# Patient Record
Sex: Male | Born: 1955 | Race: Black or African American | Hispanic: No | Marital: Married | Smoking: Former smoker
Health system: Southern US, Community
[De-identification: ages and names within clinical notes are randomized; demographics above are authoritative.]

## PROBLEM LIST (undated history)

## (undated) DIAGNOSIS — Z972 Presence of dental prosthetic device (complete) (partial): Secondary | ICD-10-CM

## (undated) DIAGNOSIS — M1990 Unspecified osteoarthritis, unspecified site: Secondary | ICD-10-CM

## (undated) DIAGNOSIS — K219 Gastro-esophageal reflux disease without esophagitis: Secondary | ICD-10-CM

## (undated) DIAGNOSIS — E079 Disorder of thyroid, unspecified: Secondary | ICD-10-CM

## (undated) HISTORY — DX: Gastro-esophageal reflux disease without esophagitis: K21.9

---

## 2014-07-06 ENCOUNTER — Ambulatory Visit (INDEPENDENT_AMBULATORY_CARE_PROVIDER_SITE_OTHER): Payer: Self-pay | Admitting: Emergency Medicine

## 2014-07-06 VITALS — BP 120/84 | HR 93 | Temp 97.6°F | Resp 20 | Ht 65.0 in | Wt 164.1 lb

## 2014-07-06 DIAGNOSIS — Z029 Encounter for administrative examinations, unspecified: Secondary | ICD-10-CM

## 2014-07-06 DIAGNOSIS — Z021 Encounter for pre-employment examination: Secondary | ICD-10-CM

## 2014-07-06 NOTE — Progress Notes (Signed)
[facility] [address] [place] [address] [phone]  Date:  07/06/2014   Name:  Tony Hernandez   DOB:  12/23/1955   MRN:  [id number]  PCP:  No primary care provider on file.    Chief Complaint: Employment Physical   History of Present Illness:  Tony Hernandez is a 59 y.o. very pleasant male patient who presents with the following:  DOT   There are no active problems to display for this patient.   History reviewed. No pertinent past medical history.  History reviewed. No pertinent past surgical history.  History  Substance Use Topics  . Smoking status: Former Research scientist (life sciences)  . Smokeless tobacco: Never Used  . Alcohol Use: No    History reviewed. No pertinent family history.  No Known Allergies  Medication list has been reviewed and updated.  No current outpatient prescriptions on file prior to visit.   No current facility-administered medications on file prior to visit.    Review of Systems:  As per HPI, otherwise negative.    Physical Examination: Filed Vitals:   07/06/14 0914  BP: 120/84  Pulse: 93  Temp: 97.6 F (36.4 C)  Resp: 20   Filed Vitals:   07/06/14 0914  Height: 5\' 5"  (1.651 m)  Weight: 164 lb 2 oz (74.447 kg)   Body mass index is 27.31 kg/(m^2). Ideal Body Weight: Weight in (lb) to have BMI = 25: 149.9  GEN: WDWN, NAD, Non-toxic, A & O x 3 HEENT: Atraumatic, Normocephalic. Neck supple. No masses, No LAD. Ears and Nose: No external deformity. CV: RRR, No M/G/R. No JVD. No thrill. No extra heart sounds. PULM: CTA B, no wheezes, crackles, rhonchi. No retractions. No resp. distress. No accessory muscle use. ABD: S, NT, ND, +BS. No rebound. No HSM. EXTR: No c/c/e NEURO Normal gait.  PSYCH: Normally interactive. Conversant. Not depressed or anxious appearing.  Calm demeanor.    Assessment and Plan: DOT   Signed,  Ellison Carwin, MD

## 2014-08-27 ENCOUNTER — Emergency Department (HOSPITAL_COMMUNITY)
Admission: EM | Admit: 2014-08-27 | Discharge: 2014-08-27 | Disposition: A | Discharge status: Home / Self Care | Payer: Self-pay | Attending: Emergency Medicine | Admitting: Emergency Medicine

## 2014-08-27 ENCOUNTER — Encounter (HOSPITAL_COMMUNITY): Payer: Self-pay | Admitting: Family Medicine

## 2014-08-27 DIAGNOSIS — R202 Paresthesia of skin: Secondary | ICD-10-CM | POA: Insufficient documentation

## 2014-08-27 DIAGNOSIS — E039 Hypothyroidism, unspecified: Secondary | ICD-10-CM | POA: Insufficient documentation

## 2014-08-27 DIAGNOSIS — Z87891 Personal history of nicotine dependence: Secondary | ICD-10-CM | POA: Insufficient documentation

## 2014-08-27 HISTORY — DX: Disorder of thyroid, unspecified: E07.9

## 2014-08-27 LAB — I-STAT CHEM 8, ED
BUN: 14 mg/dL (ref 6–20)
Calcium, Ion: 1.25 mmol/L — ABNORMAL HIGH (ref 1.12–1.23)
Chloride: 102 mmol/L (ref 101–111)
Creatinine, Ser: 1.3 mg/dL — ABNORMAL HIGH (ref 0.61–1.24)
Glucose, Bld: 81 mg/dL (ref 65–99)
HCT: 41 % (ref 39.0–52.0)
Hemoglobin: 13.9 g/dL (ref 13.0–17.0)
Potassium: 3.6 mmol/L (ref 3.5–5.1)
Sodium: 140 mmol/L (ref 135–145)
TCO2: 25 mmol/L (ref 0–100)

## 2014-08-27 LAB — TSH: TSH: >90 u[IU]/mL — ABNORMAL HIGH (ref 0.350–4.500)

## 2014-08-27 LAB — T4, FREE: Free T4: <0.25 ng/dL — ABNORMAL LOW (ref 0.61–1.12)

## 2014-08-27 MED ORDER — LEVOTHYROXINE SODIUM 112 MCG PO TABS: 112.0000 ug | ORAL_TABLET | Freq: Every day | ORAL | Status: DC

## 2014-08-27 NOTE — ED Provider Notes (Signed)
CSN: [id number]     Arrival date & time 08/27/14  0850 History   First MD Initiated Contact with Patient 08/27/14 0912     Chief Complaint  Patient presents with  . Hand Pain  . Numbness   HPI Pt has noticed that both hands have felt numb and tingling in the finger tips of both hands for the past two weeks.  It comes and goes.  It seems to happen more at night.  He has to rub his hands to feel like the circulation is coming back. Pt has had trouble Past Medical History  Diagnosis Date  . Thyroid disease    History reviewed. No pertinent past surgical history. History reviewed. No pertinent family history. History  Substance Use Topics  . Smoking status: Former Research scientist (life sciences)  . Smokeless tobacco: Never Used  . Alcohol Use: No    Review of Systems    Allergies  Review of patient's allergies indicates no known allergies.  Home Medications   Prior to Admission medications   Medication Sig Start Date End Date Taking? Authorizing Provider  ibuprofen (ADVIL,MOTRIN) 200 MG tablet Take 200 mg by mouth every 6 (six) hours as needed for fever or moderate pain.   Yes Historical Provider, MD  levothyroxine (SYNTHROID, LEVOTHROID) 112 MCG tablet Take 1 tablet (112 mcg total) by mouth daily before breakfast. 08/27/14   Dorie Rank, MD   BP 127/86 mmHg  Pulse 59  Temp(Src) 97.8 F (36.6 C) (Oral)  Resp 20  SpO2 99% Physical Exam  Constitutional: He appears well-developed and well-nourished. No distress.  HENT:  Head: Normocephalic and atraumatic.  Right Ear: External ear normal.  Left Ear: External ear normal.  Eyes: Conjunctivae are normal. Right eye exhibits no discharge. Left eye exhibits no discharge. No scleral icterus.  Neck: Neck supple. No tracheal deviation present.  Cardiovascular: Normal rate, regular rhythm and intact distal pulses.   Pulmonary/Chest: Effort normal and breath sounds normal. No stridor. No respiratory distress. He has no wheezes. He has no rales.  Abdominal: Soft.  Bowel sounds are normal. He exhibits no distension. There is no tenderness. There is no rebound and no guarding.  Musculoskeletal: He exhibits no edema or tenderness.  Neurological: He is alert. He has normal strength. No cranial nerve deficit (no facial droop, extraocular movements intact, no slurred speech) or sensory deficit. He exhibits normal muscle tone. He displays no seizure activity. Coordination normal.  Skin: Skin is warm and dry. No rash noted.  Psychiatric: He has a normal mood and affect.  Nursing note and vitals reviewed.   ED Course  Procedures (including critical care time) Labs Review Labs Reviewed  T4, FREE - Abnormal; Notable for the following:    Free T4 <0.25 (*)    All other components within normal limits  I-STAT CHEM 8, ED - Abnormal; Notable for the following:    Creatinine, Ser 1.30 (*)    Calcium, Ion 1.25 (*)    All other components within normal limits  TSH     MDM   Final diagnoses:  Hypothyroidism, unspecified hypothyroidism type  Paresthesia    Pt has a normal exam.   No edema noted.  Will refill his thyroid med.  Referral to [facility].    Dorie Rank, MD 08/27/14 [phone]

## 2014-08-27 NOTE — Discharge Instructions (Signed)
Hypothyroidism The thyroid is a large gland located in the lower front of your neck. The thyroid gland helps control metabolism. Metabolism is how your body handles food. It controls metabolism with the hormone thyroxine. When this gland is underactive (hypothyroid), it produces too little hormone.  CAUSES These include:   Absence or destruction of thyroid tissue.  Goiter due to iodine deficiency.  Goiter due to medications.  Congenital defects (since birth).  Problems with the pituitary. This causes a lack of TSH (thyroid stimulating hormone). This hormone tells the thyroid to turn out more hormone. SYMPTOMS  Lethargy (feeling as though you have no energy)  Cold intolerance  Weight gain (in spite of normal food intake)  Dry skin  Coarse hair  Menstrual irregularity (if severe, may lead to infertility)  Slowing of thought processes Cardiac problems are also caused by insufficient amounts of thyroid hormone. Hypothyroidism in the newborn is cretinism, and is an extreme form. It is important that this form be treated adequately and immediately or it will lead rapidly to retarded physical and mental development. DIAGNOSIS  To prove hypothyroidism, your caregiver may do blood tests and ultrasound tests. Sometimes the signs are hidden. It may be necessary for your caregiver to watch this illness with blood tests either before or after diagnosis and treatment. TREATMENT  Low levels of thyroid hormone are increased by using synthetic thyroid hormone. This is a safe, effective treatment. It usually takes about four weeks to gain the full effects of the medication. After you have the full effect of the medication, it will generally take another four weeks for problems to leave. Your caregiver may start you on low doses. If you have had heart problems the dose may be gradually increased. It is generally not an emergency to get rapidly to normal. HOME CARE INSTRUCTIONS   Take your  medications as your caregiver suggests. Let your caregiver know of any medications you are taking or start taking. Your caregiver will help you with dosage schedules.  As your condition improves, your dosage needs may increase. It will be necessary to have continuing blood tests as suggested by your caregiver.  Report all suspected medication side effects to your caregiver. SEEK MEDICAL CARE IF: Seek medical care if you develop:  Sweating.  Tremulousness (tremors).  Anxiety.  Rapid weight loss.  Heat intolerance.  Emotional swings.  Diarrhea.  Weakness. SEEK IMMEDIATE MEDICAL CARE IF:  You develop chest pain, an irregular heart beat (palpitations), or a rapid heart beat. MAKE SURE YOU:   Understand these instructions.  Will watch your condition.  Will get help right away if you are not doing well or get worse. Document Released: 03/09/2005 Document Revised: 06/01/2011 Document Reviewed: 10/28/2007 ExitCare Patient Information 2015 [employer]. This information is not intended to replace advice given to you by your health care provider. Make sure you discuss any questions you have with your health care provider.  

## 2014-08-27 NOTE — ED Notes (Addendum)
Pt here with hand pain, numbness, right ear pain. sts he hasnt had his thyroid medication in 2 months. sts also some facial swelling and throat irritation.

## 2016-03-23 HISTORY — PX: CHOLECYSTECTOMY: SHX55

## 2016-06-09 ENCOUNTER — Emergency Department (HOSPITAL_COMMUNITY)
Admission: EM | Admit: 2016-06-09 | Discharge: 2016-06-09 | Disposition: A | Discharge status: Home / Self Care | Payer: BLUE CROSS/BLUE SHIELD | Attending: Emergency Medicine | Admitting: Emergency Medicine

## 2016-06-09 ENCOUNTER — Emergency Department (HOSPITAL_COMMUNITY): Payer: BLUE CROSS/BLUE SHIELD

## 2016-06-09 ENCOUNTER — Encounter (HOSPITAL_COMMUNITY): Payer: Self-pay | Admitting: Emergency Medicine

## 2016-06-09 DIAGNOSIS — K8020 Calculus of gallbladder without cholecystitis without obstruction: Secondary | ICD-10-CM | POA: Diagnosis not present

## 2016-06-09 DIAGNOSIS — Z87891 Personal history of nicotine dependence: Secondary | ICD-10-CM | POA: Diagnosis not present

## 2016-06-09 DIAGNOSIS — R1013 Epigastric pain: Secondary | ICD-10-CM | POA: Diagnosis present

## 2016-06-09 DIAGNOSIS — R1011 Right upper quadrant pain: Secondary | ICD-10-CM

## 2016-06-09 LAB — URINALYSIS, ROUTINE W REFLEX MICROSCOPIC
Bilirubin Urine: NEGATIVE
Glucose, UA: NEGATIVE mg/dL
Hgb urine dipstick: NEGATIVE
Ketones, ur: NEGATIVE mg/dL
Leukocytes, UA: NEGATIVE
Nitrite: NEGATIVE
Protein, ur: 30 mg/dL — AB
Specific Gravity, Urine: 1.016 (ref 1.005–1.030)
pH: 8 (ref 5.0–8.0)

## 2016-06-09 LAB — CBC
HCT: 40.1 % (ref 39.0–52.0)
Hemoglobin: 13.1 g/dL (ref 13.0–17.0)
MCH: 24.9 pg — ABNORMAL LOW (ref 26.0–34.0)
MCHC: 32.7 g/dL (ref 30.0–36.0)
MCV: 76.1 fL — ABNORMAL LOW (ref 78.0–100.0)
Platelets: 323 10*3/uL (ref 150–400)
RBC: 5.27 MIL/uL (ref 4.22–5.81)
RDW: 13.6 % (ref 11.5–15.5)
WBC: 9 10*3/uL (ref 4.0–10.5)

## 2016-06-09 LAB — COMPREHENSIVE METABOLIC PANEL
ALT: 34 U/L (ref 17–63)
AST: 53 U/L — ABNORMAL HIGH (ref 15–41)
Albumin: 3.7 g/dL (ref 3.5–5.0)
Alkaline Phosphatase: 56 U/L (ref 38–126)
Anion gap: 13 (ref 5–15)
BUN: 12 mg/dL (ref 6–20)
CO2: 22 mmol/L (ref 22–32)
Calcium: 9.4 mg/dL (ref 8.9–10.3)
Chloride: 103 mmol/L (ref 101–111)
Creatinine, Ser: 1.1 mg/dL (ref 0.61–1.24)
GFR calc Af Amer: >60 mL/min (ref >60)
GFR calc non Af Amer: >60 mL/min (ref >60)
Glucose, Bld: 162 mg/dL — ABNORMAL HIGH (ref 65–99)
Potassium: 3.7 mmol/L (ref 3.5–5.1)
Sodium: 138 mmol/L (ref 135–145)
Total Bilirubin: 0.4 mg/dL (ref 0.3–1.2)
Total Protein: 6.8 g/dL (ref 6.5–8.1)

## 2016-06-09 LAB — LIPASE, BLOOD: Lipase: 18 U/L (ref 11–51)

## 2016-06-09 MED ORDER — ONDANSETRON HCL 4 MG/2ML IJ SOLN
4.0000 mg | Freq: Once | INTRAMUSCULAR | Status: AC
  Administered 2016-06-09: 4 mg via INTRAVENOUS
  Filled 2016-06-09: qty 2

## 2016-06-09 MED ORDER — ONDANSETRON 4 MG PO TBDP: 4.0000 mg | ORAL_TABLET | Freq: Three times a day (TID) | ORAL | 0 refills | Status: DC | PRN

## 2016-06-09 MED ORDER — SODIUM CHLORIDE 0.9 % IV BOLUS (SEPSIS)
1000.0000 mL | Freq: Once | INTRAVENOUS | Status: AC
  Administered 2016-06-09: 1000 mL via INTRAVENOUS

## 2016-06-09 MED ORDER — OXYCODONE-ACETAMINOPHEN 5-325 MG PO TABS
1.0000 | ORAL_TABLET | Freq: Four times a day (QID) | ORAL | 0 refills | Status: DC | PRN
Start: 2016-06-09 — End: 2018-05-16

## 2016-06-09 MED ORDER — MORPHINE SULFATE (PF) 4 MG/ML IV SOLN
4.0000 mg | Freq: Once | INTRAVENOUS | Status: AC
  Administered 2016-06-09: 4 mg via INTRAVENOUS
  Filled 2016-06-09: qty 1

## 2016-06-09 NOTE — Discharge Instructions (Signed)
Return to the ED with any concerns including vomiting and not able to keep down liquids or your medications, abdominal pain that is not controlled by pain medications, fever or chills, and decreased urine output, decreased level of alertness or lethargy, or any other alarming symptoms.

## 2016-06-09 NOTE — ED Triage Notes (Signed)
Pt c/o 10/10 abd pain, started today at work. Pt states he vomited one time today and pain is getting worse. Wife states he got sea food for dinner last night no hx of allergy to this type of food.

## 2016-06-09 NOTE — ED Provider Notes (Signed)
[facility] DEPT Provider Note   CSN: [id number] Arrival date & time: 06/09/16  0938     History   Chief Complaint Chief Complaint  Patient presents with  . Abdominal Pain    HPI Tony Hernandez is a 61 y.o. male.  HPI  Pt presenting with acute onset this morning of mid and epigastric abdominal pain as well as one episode of vomiting- nonbilious and nonbloody.  No fever/chills.  No diarrhea.  Last BM was this morning begore the pain began.  No difficulty urinating or dysuria. Pt has not had similar pain in the past.  Pain is sharp and aching in nature.  Did not feel relieved by vomiting.  He has not had any treatment prior to arrival.  There are no other associated systemic symptoms, there are no other alleviating or modifying factors.   Past Medical History:  Diagnosis Date  . Thyroid disease     There are no active problems to display for this patient.   History reviewed. No pertinent surgical history.     Home Medications    Prior to Admission medications   Medication Sig Start Date End Date Taking? Authorizing Provider  ibuprofen (ADVIL,MOTRIN) 200 MG tablet Take 200 mg by mouth every 6 (six) hours as needed for fever or moderate pain.   Yes Historical Provider, MD  levothyroxine (SYNTHROID, LEVOTHROID) 112 MCG tablet Take 1 tablet (112 mcg total) by mouth daily before breakfast. 08/27/14  Yes Dorie Rank, MD  levothyroxine (SYNTHROID, LEVOTHROID) 137 MCG tablet Take 137 mcg by mouth daily. 04/01/16  Yes Historical Provider, MD  ondansetron (ZOFRAN ODT) 4 MG disintegrating tablet Take 1 tablet (4 mg total) by mouth every 8 (eight) hours as needed. 06/09/16   Alfonzo Beers, MD  oxyCODONE-acetaminophen (PERCOCET/ROXICET) 5-325 MG tablet Take 1-2 tablets by mouth every 6 (six) hours as needed for severe pain. 06/09/16   Alfonzo Beers, MD    Family History History reviewed. No pertinent family history.  Social History Social History  Substance Use Topics  . Smoking status:  Former Research scientist (life sciences)  . Smokeless tobacco: Never Used  . Alcohol use No     Allergies   Patient has no known allergies.   Review of Systems Review of Systems  ROS reviewed and all otherwise negative except for mentioned in HPI   Physical Exam Updated Vital Signs BP 118/88   Pulse 78   Temp 98.7 F (37.1 C) (Rectal)   Resp 13   Ht 5\' 5"  (1.651 m)   Wt 74.8 kg   SpO2 99%   BMI 27.46 kg/m  Vitals reviewed Physical Exam Physical Examination: General appearance - alert, well appearing, and in no distress Mental status - alert, oriented to person, place, and time Eyes - no conjunctival injection, no scleral icterus Mouth - mucous membranes moist, pharynx normal without lesions Neck - supple, no significant adenopathy Chest - clear to auscultation, no wheezes, rales or rhonchi, symmetric air entry Heart - normal rate, regular rhythm, normal S1, S2, no murmurs, rubs, clicks or gallops Abdomen - soft, epigastric and mid abdominal tenderness, no gaurding or rebound, nabs, nondistended, no masses or organomegaly Neurological - alert, oriented, normal speech, no focal findings or movement disorder noted Extremities - peripheral pulses normal, no pedal edema, no clubbing or cyanosis Skin - normal coloration and turgor, no rashes  ED Treatments / Results  Labs (all labs ordered are listed, but only abnormal results are displayed) Labs Reviewed  COMPREHENSIVE METABOLIC PANEL - Abnormal; Notable for  the following:       Result Value   Glucose, Bld 162 (*)    AST 53 (*)    All other components within normal limits  CBC - Abnormal; Notable for the following:    MCV 76.1 (*)    MCH 24.9 (*)    All other components within normal limits  URINALYSIS, ROUTINE W REFLEX MICROSCOPIC - Abnormal; Notable for the following:    Protein, ur 30 (*)    Bacteria, UA FEW (*)    Squamous Epithelial / LPF 0-5 (*)    All other components within normal limits  LIPASE, BLOOD    EKG  EKG  Interpretation None       Radiology Dg Abd Acute W/chest  Result Date: 06/09/2016 CLINICAL DATA:  Abdominal pain and vomiting for 1 day EXAM: DG ABDOMEN ACUTE W/ 1V CHEST COMPARISON:  None. FINDINGS: PA chest: Lungs are clear. Heart size and pulmonary vascularity are normal. No adenopathy. Supine and upright abdomen: There is moderate stool throughout the colon. There is no bowel dilatation or air-fluid level suggesting bowel obstruction. No free air. There is lumbar levoscoliosis. IMPRESSION: Moderate stool throughout colon. No bowel obstruction or free air. Lungs clear. Electronically Signed   By: Lowella Grip III M.D.   On: 06/09/2016 08:20   US Abdomen Limited Ruq  Result Date: 06/09/2016 CLINICAL DATA:  Right upper quadrant pain with nausea EXAM: US ABDOMEN LIMITED - RIGHT UPPER QUADRANT COMPARISON:  None. FINDINGS: Gallbladder: Within the gallbladder, there is an echogenic focus in the gallbladder which moves and shadows measuring 1.3 cm in length consistent with cholelithiasis. There is no gallbladder wall thickening or pericholecystic fluid. No sonographic Murphy sign noted by sonographer. Common bile duct: Diameter: 4 mm. No intrahepatic or extrahepatic biliary duct dilatation. Liver: No focal lesion identified. Within normal limits in parenchymal echogenicity. IMPRESSION: Cholelithiasis.  Study otherwise unremarkable. Electronically Signed   By: Lowella Grip III M.D.   On: 06/09/2016 09:56    Procedures Procedures (including critical care time)  Medications Ordered in ED Medications  morphine 4 MG/ML injection 4 mg (4 mg Intravenous Given 06/09/16 0815)  ondansetron (ZOFRAN) injection 4 mg (4 mg Intravenous Given 06/09/16 0815)  sodium chloride 0.9 % bolus 1,000 mL (0 mLs Intravenous Stopped 06/09/16 1148)     Initial Impression / Assessment and Plan / ED Course  I have reviewed the triage vital signs and the nursing notes.  Pertinent labs & imaging results that were  available during my care of the patient were reviewed by me and considered in my medical decision making (see chart for details).     Pt presenting with onset of epigastric pain and one episode of emesis, workup shows he has gallstones- pt feels much improved after pain and nausea meds- he is able to tolerate po fluids in the Ed. Discussed results and plan with patient and his wife.  Given referral to surgery.  Discharged with strict return precautions.  Pt agreeable with plan.  Final Clinical Impressions(s) / ED Diagnoses   Final diagnoses:  Gallstones    New Prescriptions Discharge Medication List as of 06/09/2016 11:39 AM    START taking these medications   Details  ondansetron (ZOFRAN ODT) 4 MG disintegrating tablet Take 1 tablet (4 mg total) by mouth every 8 (eight) hours as needed., Starting Tue 06/09/2016, Print    oxyCODONE-acetaminophen (PERCOCET/ROXICET) 5-325 MG tablet Take 1-2 tablets by mouth every 6 (six) hours as needed for severe pain., Starting Tue  06/09/2016, Print         Alfonzo Beers, MD 06/09/16 1600

## 2018-03-07 ENCOUNTER — Ambulatory Visit: Payer: BLUE CROSS/BLUE SHIELD | Admitting: Family Medicine

## 2018-03-07 DIAGNOSIS — Z0289 Encounter for other administrative examinations: Secondary | ICD-10-CM

## 2018-04-05 ENCOUNTER — Ambulatory Visit: Payer: BLUE CROSS/BLUE SHIELD | Admitting: Family Medicine

## 2018-04-05 DIAGNOSIS — Z0289 Encounter for other administrative examinations: Secondary | ICD-10-CM

## 2018-05-16 ENCOUNTER — Ambulatory Visit: Payer: BLUE CROSS/BLUE SHIELD | Admitting: Primary Care

## 2018-05-16 ENCOUNTER — Encounter: Payer: Self-pay | Admitting: Primary Care

## 2018-05-16 DIAGNOSIS — K219 Gastro-esophageal reflux disease without esophagitis: Secondary | ICD-10-CM | POA: Insufficient documentation

## 2018-05-16 DIAGNOSIS — E039 Hypothyroidism, unspecified: Secondary | ICD-10-CM | POA: Diagnosis not present

## 2018-05-16 LAB — COMPREHENSIVE METABOLIC PANEL
ALT: 22 U/L (ref 0–53)
AST: 17 U/L (ref 0–37)
Albumin: 4.4 g/dL (ref 3.5–5.2)
Alkaline Phosphatase: 60 U/L (ref 39–117)
BUN: 12 mg/dL (ref 6–23)
CO2: 27 mEq/L (ref 19–32)
Calcium: 9.7 mg/dL (ref 8.4–10.5)
Chloride: 106 mEq/L (ref 96–112)
Creatinine, Ser: 0.99 mg/dL (ref 0.40–1.50)
GFR: 93.08 mL/min (ref >60.00)
Glucose, Bld: 80 mg/dL (ref 70–99)
Potassium: 3.9 mEq/L (ref 3.5–5.1)
Sodium: 140 mEq/L (ref 135–145)
Total Bilirubin: 0.3 mg/dL (ref 0.2–1.2)
Total Protein: 7.3 g/dL (ref 6.0–8.3)

## 2018-05-16 LAB — TSH: TSH: 1.66 u[IU]/mL (ref 0.35–4.50)

## 2018-05-16 MED ORDER — LEVOTHYROXINE SODIUM 150 MCG PO TABS: ORAL_TABLET | ORAL | 0 refills | Status: DC

## 2018-05-16 NOTE — Assessment & Plan Note (Signed)
Intermittent, seems to be doing well off of pantoprazole, continue off. He will update if symptoms return. Consider Pepcid vs lower dose PPI if symptoms return.

## 2018-05-16 NOTE — Progress Notes (Signed)
Subjective:    Patient ID: Tony Hernandez, male    DOB: September 04, 1955, 63 y.o.   MRN: [id number]  HPI  Tony Hernandez is a 63 year old male who presents today to establish care and discuss the problems mentioned below. Will obtain old records.  1) Hypothyroidism: Currently managed on levothyroxine 150 mcg. He takes his medication in the morning with water and doesn't eat for 1-2 hours after taking. He doesn't take any vitamins or other supplements. He took his last tablet this morning and is needing refills.   2) GERD: Currently managed on pantoprazole 40 mg daily. Prescribed initially at [facility] about one year ago for epigastric abdominal pain. He's been taking this intermittently since it was prescribed, has no symptoms without his medication. He denies esophageal burning, chest pain, epigastric pain, nausea.  BP Readings from Last 3 Encounters:  05/16/18 130/84  06/09/16 118/88  08/27/14 127/86     Review of Systems  Eyes: Negative for visual disturbance.  Respiratory: Negative for shortness of breath.   Cardiovascular: Negative for chest pain.  Neurological: Negative for dizziness and headaches.       Past Medical History:  Diagnosis Date  . GERD (gastroesophageal reflux disease)   . Thyroid disease      Social History   Socioeconomic History  . Marital status: Single    Spouse name: Not on file  . Number of children: Not on file  . Years of education: Not on file  . Highest education level: Not on file  Occupational History  . Not on file  Social Needs  . Financial resource strain: Not on file  . Food insecurity:    Worry: Not on file    Inability: Not on file  . Transportation needs:    Medical: Not on file    Non-medical: Not on file  Tobacco Use  . Smoking status: Former Research scientist (life sciences)  . Smokeless tobacco: Never Used  Substance and Sexual Activity  . Alcohol use: No    Alcohol/week: 0.0 standard drinks  . Drug use: No  . Sexual activity: Not on file    Lifestyle  . Physical activity:    Days per week: Not on file    Minutes per session: Not on file  . Stress: Not on file  Relationships  . Social connections:    Talks on phone: Not on file    Gets together: Not on file    Attends religious service: Not on file    Active member of club or organization: Not on file    Attends meetings of clubs or organizations: Not on file    Relationship status: Not on file  . Intimate partner violence:    Fear of current or ex partner: Not on file    Emotionally abused: Not on file    Physically abused: Not on file    Forced sexual activity: Not on file  Other Topics Concern  . Not on file  Social History Narrative   Single.   2 children.   Works as a Administrator.   Enjoys spending time with family.    Past Surgical History:  Procedure Laterality Date  . CHOLECYSTECTOMY  2018    Family History  Problem Relation Age of Onset  . Lung cancer Mother   . Diabetes Paternal Grandmother     No Known Allergies  Current Outpatient Medications on File Prior to Visit  Medication Sig Dispense Refill  . levothyroxine (SYNTHROID, LEVOTHROID) 150 MCG tablet  Take 150 mcg by mouth daily before breakfast.      No current facility-administered medications on file prior to visit.     BP 130/84   Pulse 91   Temp 98.1 F (36.7 C) (Oral)   Ht 5' 4.5" (1.638 m)   Wt 164 lb (74.4 kg)   SpO2 98%   BMI 27.72 kg/m    Objective:   Physical Exam  Constitutional: He appears well-nourished.  Neck: Neck supple. No thyromegaly present.  Cardiovascular: Normal rate and regular rhythm.  Respiratory: Effort normal and breath sounds normal.  Skin: Skin is warm and dry.           Assessment & Plan:

## 2018-05-16 NOTE — Patient Instructions (Addendum)
Stop by the lab prior to leaving today. I will notify you of your results once received.   I sent in a 30 day supply of your levothyroxine to the pharmacy. I will send more once we confirm that your thyroid lab is stable.  It was a pleasure to meet you today! Please don't hesitate to call or message me with any questions. Welcome to [facility]!

## 2018-05-16 NOTE — Assessment & Plan Note (Signed)
Compliant to levothyroxine and is taking appropriately.  Will check TSH today. We will also provide him with a 30 day supply of his current dose as he took his last dose today. Once we have confirmed that he is stable we will refill for 90 days per Rx. He verbalized understanding.

## 2018-05-18 ENCOUNTER — Telehealth: Payer: Self-pay | Admitting: Primary Care

## 2018-05-18 NOTE — Telephone Encounter (Signed)
Rec'd from [employer] forwarded 33 pages to Emery NP

## 2018-05-23 ENCOUNTER — Telehealth: Payer: Self-pay | Admitting: Primary Care

## 2018-05-23 NOTE — Telephone Encounter (Signed)
Please notify patient that I received his records and see that his PSA (prostate level) was elevated. Did anyone ever mention this to him? Did he ever see a Urologist?

## 2018-05-25 NOTE — Telephone Encounter (Signed)
Tried to call patient on 05/24/2018 and 05/25/2018. However, could not leave message since voicemail box is not set up.

## 2018-05-26 NOTE — Telephone Encounter (Signed)
Please send patient letter stating that his PSA (prostate level) was elevated from prior records. I would like for him to return for a lab appointment to recheck his PSA.

## 2018-05-27 ENCOUNTER — Encounter: Payer: Self-pay | Admitting: *Deleted

## 2018-05-27 NOTE — Telephone Encounter (Signed)
Sending letter

## 2018-06-07 ENCOUNTER — Other Ambulatory Visit: Payer: Self-pay | Admitting: Primary Care

## 2018-06-07 DIAGNOSIS — Z87898 Personal history of other specified conditions: Secondary | ICD-10-CM

## 2018-06-09 ENCOUNTER — Other Ambulatory Visit: Payer: Self-pay

## 2018-06-09 ENCOUNTER — Other Ambulatory Visit (INDEPENDENT_AMBULATORY_CARE_PROVIDER_SITE_OTHER): Payer: BLUE CROSS/BLUE SHIELD

## 2018-06-09 DIAGNOSIS — Z87898 Personal history of other specified conditions: Secondary | ICD-10-CM | POA: Diagnosis not present

## 2018-06-09 LAB — PSA: PSA: 6.55 ng/mL — ABNORMAL HIGH (ref 0.10–4.00)

## 2018-06-10 ENCOUNTER — Other Ambulatory Visit: Payer: Self-pay | Admitting: Primary Care

## 2018-06-10 ENCOUNTER — Telehealth: Payer: Self-pay

## 2018-06-10 ENCOUNTER — Encounter: Payer: Self-pay | Admitting: Primary Care

## 2018-06-10 NOTE — Telephone Encounter (Signed)
Pt called with lab results he is agreeable to see a urologist. PSA is elevated as it was on his medical records

## 2018-09-08 ENCOUNTER — Other Ambulatory Visit: Payer: Self-pay | Admitting: Primary Care

## 2018-09-08 DIAGNOSIS — E039 Hypothyroidism, unspecified: Secondary | ICD-10-CM

## 2018-10-06 ENCOUNTER — Other Ambulatory Visit: Payer: Self-pay | Admitting: Primary Care

## 2018-10-06 DIAGNOSIS — E039 Hypothyroidism, unspecified: Secondary | ICD-10-CM

## 2018-10-29 ENCOUNTER — Other Ambulatory Visit: Payer: Self-pay | Admitting: Primary Care

## 2018-10-29 DIAGNOSIS — E039 Hypothyroidism, unspecified: Secondary | ICD-10-CM

## 2019-01-03 ENCOUNTER — Other Ambulatory Visit: Payer: Self-pay

## 2019-01-03 DIAGNOSIS — Z20822 Contact with and (suspected) exposure to covid-19: Secondary | ICD-10-CM

## 2019-01-05 LAB — NOVEL CORONAVIRUS, NAA: SARS-CoV-2, NAA: NOT DETECTED

## 2019-04-07 ENCOUNTER — Ambulatory Visit (INDEPENDENT_AMBULATORY_CARE_PROVIDER_SITE_OTHER): Payer: 59 | Admitting: Primary Care

## 2019-04-07 ENCOUNTER — Other Ambulatory Visit: Payer: Self-pay

## 2019-04-07 ENCOUNTER — Encounter: Payer: Self-pay | Admitting: Primary Care

## 2019-04-07 VITALS — BP 124/82 | HR 86 | Temp 97.3°F | Resp 18 | Ht 66.0 in | Wt 176.8 lb

## 2019-04-07 DIAGNOSIS — Z23 Encounter for immunization: Secondary | ICD-10-CM

## 2019-04-07 DIAGNOSIS — E039 Hypothyroidism, unspecified: Secondary | ICD-10-CM | POA: Diagnosis not present

## 2019-04-07 DIAGNOSIS — K219 Gastro-esophageal reflux disease without esophagitis: Secondary | ICD-10-CM

## 2019-04-07 DIAGNOSIS — Z1211 Encounter for screening for malignant neoplasm of colon: Secondary | ICD-10-CM | POA: Diagnosis not present

## 2019-04-07 DIAGNOSIS — Z87898 Personal history of other specified conditions: Secondary | ICD-10-CM | POA: Diagnosis not present

## 2019-04-07 DIAGNOSIS — Z0000 Encounter for general adult medical examination without abnormal findings: Secondary | ICD-10-CM | POA: Diagnosis not present

## 2019-04-07 NOTE — Assessment & Plan Note (Signed)
Tetanus and first Shingrix vaccines due, provided today. Declines influenza vaccine. PSA overdue for repeat, pending. Colonoscopy overdue, referral placed to GI. Encouraged a healthy diet, regular exercise.  Exam today benign. Labs pending.

## 2019-04-07 NOTE — Assessment & Plan Note (Addendum)
Prostate exam today without obvious nodules or enlargement. No systemic alarm signs.  Repeat PSA pending today.  After further chart review he's had numerous elevated PSA readings ranging 6-12. For this reason we will refer him to Urology for further evaluation. He agrees.

## 2019-04-07 NOTE — Patient Instructions (Signed)
Stop by the lab prior to leaving today. I will notify you of your results once received.   Be sure to take your levothyroxine (thyroid medication) every morning on an empty stomach with water only. No food or other medications for 30 minutes. No heartburn medication, iron pills, calcium, vitamin D, or magnesium pills within four hours of taking levothyroxine.   Start exercising. You should be getting 150 minutes of moderate intensity exercise weekly.  It's important to improve your diet by reducing consumption of fast food, fried food, processed snack foods, sugary drinks. Increase consumption of fresh vegetables and fruits, whole grains, water.  Ensure you are drinking 64 ounces of water daily.  Schedule a nurse visit for your second Shingles vaccine in 2-6 months.  It was a pleasure to see you today!   Preventive Care 58-68 Years Old, Male Preventive care refers to lifestyle choices and visits with your health care provider that can promote health and wellness. This includes:  A yearly physical exam. This is also called an annual well check.  Regular dental and eye exams.  Immunizations.  Screening for certain conditions.  Healthy lifestyle choices, such as eating a healthy diet, getting regular exercise, not using drugs or products that contain nicotine and tobacco, and limiting alcohol use. What can I expect for my preventive care visit? Physical exam Your health care provider will check:  Height and weight. These may be used to calculate body mass index (BMI), which is a measurement that tells if you are at a healthy weight.  Heart rate and blood pressure.  Your skin for abnormal spots. Counseling Your health care provider may ask you questions about:  Alcohol, tobacco, and drug use.  Emotional well-being.  Home and relationship well-being.  Sexual activity.  Eating habits.  Work and work Statistician. What immunizations do I need?  Influenza (flu)  vaccine  This is recommended every year. Tetanus, diphtheria, and pertussis (Tdap) vaccine  You may need a Td booster every 10 years. Varicella (chickenpox) vaccine  You may need this vaccine if you have not already been vaccinated. Zoster (shingles) vaccine  You may need this after age 42. Measles, mumps, and rubella (MMR) vaccine  You may need at least one dose of MMR if you were born in 1957 or later. You may also need a second dose. Pneumococcal conjugate (PCV13) vaccine  You may need this if you have certain conditions and were not previously vaccinated. Pneumococcal polysaccharide (PPSV23) vaccine  You may need one or two doses if you smoke cigarettes or if you have certain conditions. Meningococcal conjugate (MenACWY) vaccine  You may need this if you have certain conditions. Hepatitis A vaccine  You may need this if you have certain conditions or if you travel or work in places where you may be exposed to hepatitis A. Hepatitis B vaccine  You may need this if you have certain conditions or if you travel or work in places where you may be exposed to hepatitis B. Haemophilus influenzae type b (Hib) vaccine  You may need this if you have certain risk factors. Human papillomavirus (HPV) vaccine  If recommended by your health care provider, you may need three doses over 6 months. You may receive vaccines as individual doses or as more than one vaccine together in one shot (combination vaccines). Talk with your health care provider about the risks and benefits of combination vaccines. What tests do I need? Blood tests  Lipid and cholesterol levels. These may be checked  every 5 years, or more frequently if you are over 40 years old.  Hepatitis C test.  Hepatitis B test. Screening  Lung cancer screening. You may have this screening every year starting at age 69 if you have a 30-pack-year history of smoking and currently smoke or have quit within the past 15  years.  Prostate cancer screening. Recommendations will vary depending on your family history and other risks.  Colorectal cancer screening. All adults should have this screening starting at age 73 and continuing until age 54. Your health care provider may recommend screening at age 57 if you are at increased risk. You will have tests every 1-10 years, depending on your results and the type of screening test.  Diabetes screening. This is done by checking your blood sugar (glucose) after you have not eaten for a while (fasting). You may have this done every 1-3 years.  Sexually transmitted disease (STD) testing. Follow these instructions at home: Eating and drinking  Eat a diet that includes fresh fruits and vegetables, whole grains, lean protein, and low-fat dairy products.  Take vitamin and mineral supplements as recommended by your health care provider.  Do not drink alcohol if your health care provider tells you not to drink.  If you drink alcohol: ? Limit how much you have to 0-2 drinks a day. ? Be aware of how much alcohol is in your drink. In the [place] one drink equals one 12 oz bottle of beer (355 mL), one 5 oz glass of wine (148 mL), or one 1 oz glass of hard liquor (44 mL). Lifestyle  Take daily care of your teeth and gums.  Stay active. Exercise for at least 30 minutes on 5 or more days each week.  Do not use any products that contain nicotine or tobacco, such as cigarettes, e-cigarettes, and chewing tobacco. If you need help quitting, ask your health care provider.  If you are sexually active, practice safe sex. Use a condom or other form of protection to prevent STIs (sexually transmitted infections).  Talk with your health care provider about taking a low-dose aspirin every day starting at age 89. What's next?  Go to your health care provider once a year for a well check visit.  Ask your health care provider how often you should have your eyes and teeth  checked.  Stay up to date on all vaccines. This information is not intended to replace advice given to you by your health care provider. Make sure you discuss any questions you have with your health care provider. Document Revised: 03/03/2018 Document Reviewed: 03/03/2018 Elsevier Patient Education  2020 [employer].

## 2019-04-07 NOTE — Assessment & Plan Note (Signed)
He is taking levothyroxine correctly, repeat TSH pending.

## 2019-04-07 NOTE — Progress Notes (Signed)
Subjective:    Patient ID: Tony Hernandez, male    DOB: 06/04/1955, 64 y.o.   MRN: [id number]  HPI  This visit occurred during the SARS-CoV-2 public health emergency.  Safety protocols were in place, including screening questions prior to the visit, additional usage of staff PPE, and extensive cleaning of exam room while observing appropriate contact time as indicated for disinfecting solutions.   Tony Hernandez is a 64 year old male who presents today for complete physical.  Immunizations: -Tetanus: Unsure, over 10 years.  -Influenza: Declines  -Shingles: Never completed, due today.  Diet: He endorses a healthy diet. Vegetables, little red meat. Exercise: He walks daily at work.  Eye exam: No recent exam  Dental exam: No recent exam  Colonoscopy: Never completed  PSA: 6.25 in 2020, patient didn't return phone calls.  Hep C Screen: Due  BP Readings from Last 3 Encounters:  04/07/19 124/82  05/16/18 130/84  06/09/16 118/88   Wt Readings from Last 3 Encounters:  04/07/19 176 lb 12.8 oz (80.2 kg)  05/16/18 164 lb (74.4 kg)  06/09/16 165 lb (74.8 kg)     Review of Systems  Constitutional: Negative for unexpected weight change.  HENT: Negative for rhinorrhea.   Respiratory: Negative for cough and shortness of breath.   Cardiovascular: Negative for chest pain.  Gastrointestinal: Negative for constipation and diarrhea.  Genitourinary: Negative for difficulty urinating, hematuria and urgency.       Nocturia 1-2 times nightly.  Musculoskeletal: Positive for arthralgias. Negative for myalgias.       Chronic knee pain  Skin: Negative for rash.  Allergic/Immunologic: Negative for environmental allergies.  Neurological: Negative for dizziness, numbness and headaches.  Psychiatric/Behavioral: The patient is not nervous/anxious.        Past Medical History:  Diagnosis Date  . GERD (gastroesophageal reflux disease)   . Thyroid disease      Social History   Socioeconomic History   . Marital status: Single    Spouse name: Not on file  . Number of children: Not on file  . Years of education: Not on file  . Highest education level: Not on file  Occupational History  . Not on file  Tobacco Use  . Smoking status: Former Research scientist (life sciences)  . Smokeless tobacco: Never Used  Substance and Sexual Activity  . Alcohol use: No    Alcohol/week: 0.0 standard drinks  . Drug use: No  . Sexual activity: Not on file  Other Topics Concern  . Not on file  Social History Narrative   Single.   2 children.   Works as a Administrator.   Enjoys spending time with family.   Social Determinants of Health   Financial Resource Strain:   . Difficulty of Paying Living Expenses: Not on file  Food Insecurity:   . Worried About Charity fundraiser in the Last Year: Not on file  . Ran Out of Food in the Last Year: Not on file  Transportation Needs:   . Lack of Transportation (Medical): Not on file  . Lack of Transportation (Non-Medical): Not on file  Physical Activity:   . Days of Exercise per Week: Not on file  . Minutes of Exercise per Session: Not on file  Stress:   . Feeling of Stress : Not on file  Social Connections:   . Frequency of Communication with Friends and Family: Not on file  . Frequency of Social Gatherings with Friends and Family: Not on file  . Attends  Religious Services: Not on file  . Active Member of Clubs or Organizations: Not on file  . Attends Archivist Meetings: Not on file  . Marital Status: Not on file  Intimate Partner Violence:   . Fear of Current or Ex-Partner: Not on file  . Emotionally Abused: Not on file  . Physically Abused: Not on file  . Sexually Abused: Not on file    Past Surgical History:  Procedure Laterality Date  . CHOLECYSTECTOMY  2018    Family History  Problem Relation Age of Onset  . Lung cancer Mother   . Diabetes Paternal Grandmother     No Known Allergies  Current Outpatient Medications on File Prior to Visit    Medication Sig Dispense Refill  . levothyroxine (SYNTHROID) 150 MCG tablet TAKE 1 TABLET BY MOUTH EVERY DAY 90 tablet 1   No current facility-administered medications on file prior to visit.    BP 124/82   Pulse 86   Temp (!) 97.3 F (36.3 C) (Temporal)   Resp 18   Ht 5\' 6"  (1.676 m)   Wt 176 lb 12.8 oz (80.2 kg)   SpO2 97%   BMI 28.54 kg/m    Objective:   Physical Exam  Constitutional: He is oriented to person, place, and time. He appears well-nourished.  HENT:  Right Ear: Tympanic membrane and ear canal normal.  Left Ear: Tympanic membrane and ear canal normal.  Mouth/Throat: Oropharynx is clear and moist.  Eyes: Pupils are equal, round, and reactive to light. EOM are normal.  Cardiovascular: Normal rate and regular rhythm.  Respiratory: Effort normal and breath sounds normal.  GI: Soft. Bowel sounds are normal. There is no abdominal tenderness.  Genitourinary: Prostate is not enlarged and not tender.    Genitourinary Comments: No obvious enlargement or nodules noted on prostate exam. Chaperone present.    Musculoskeletal:        General: Normal range of motion.     Cervical back: Neck supple.  Neurological: He is alert and oriented to person, place, and time. No cranial nerve deficit.  Reflex Scores:      Patellar reflexes are 2+ on the right side and 2+ on the left side. Skin: Skin is warm and dry.  Psychiatric: He has a normal mood and affect.           Assessment & Plan:

## 2019-04-07 NOTE — Assessment & Plan Note (Signed)
No recent symptoms. Continue to monitor off of PPI.

## 2019-04-08 LAB — COMPREHENSIVE METABOLIC PANEL
AG Ratio: 1.8 (calc) (ref 1.0–2.5)
ALT: 16 U/L (ref 9–46)
AST: 13 U/L (ref 10–35)
Albumin: 4.4 g/dL (ref 3.6–5.1)
Alkaline phosphatase (APISO): 60 U/L (ref 35–144)
BUN: 12 mg/dL (ref 7–25)
CO2: 26 mmol/L (ref 20–32)
Calcium: 9.9 mg/dL (ref 8.6–10.3)
Chloride: 106 mmol/L (ref 98–110)
Creat: 1.1 mg/dL (ref 0.70–1.25)
Globulin: 2.5 g/dL (calc) (ref 1.9–3.7)
Glucose, Bld: 75 mg/dL (ref 65–99)
Potassium: 4.8 mmol/L (ref 3.5–5.3)
Sodium: 140 mmol/L (ref 135–146)
Total Bilirubin: 0.3 mg/dL (ref 0.2–1.2)
Total Protein: 6.9 g/dL (ref 6.1–8.1)

## 2019-04-08 LAB — CBC
HCT: 42.8 % (ref 38.5–50.0)
Hemoglobin: 13.9 g/dL (ref 13.2–17.1)
MCH: 24.8 pg — ABNORMAL LOW (ref 27.0–33.0)
MCHC: 32.5 g/dL (ref 32.0–36.0)
MCV: 76.3 fL — ABNORMAL LOW (ref 80.0–100.0)
MPV: 10.8 fL (ref 7.5–12.5)
Platelets: 389 10*3/uL (ref 140–400)
RBC: 5.61 10*6/uL (ref 4.20–5.80)
RDW: 14.2 % (ref 11.0–15.0)
WBC: 9.1 10*3/uL (ref 3.8–10.8)

## 2019-04-08 LAB — LIPID PANEL
Cholesterol: 142 mg/dL (ref <200)
HDL: 34 mg/dL — ABNORMAL LOW (ref >=40)
LDL Cholesterol (Calc): 85 mg/dL (calc)
Non-HDL Cholesterol (Calc): 108 mg/dL (calc) (ref <130)
Total CHOL/HDL Ratio: 4.2 (calc) (ref <5.0)
Triglycerides: 133 mg/dL (ref <150)

## 2019-04-08 LAB — TSH: TSH: 2.96 mIU/L (ref 0.40–4.50)

## 2019-04-08 LAB — HEMOGLOBIN A1C
Hgb A1c MFr Bld: 6.1 % of total Hgb — ABNORMAL HIGH (ref <5.7)
Mean Plasma Glucose: 128 (calc)
eAG (mmol/L): 7.1 (calc)

## 2019-04-08 LAB — PSA: PSA: 7.5 ng/mL — ABNORMAL HIGH (ref <=4.0)

## 2019-04-10 ENCOUNTER — Other Ambulatory Visit: Payer: Self-pay

## 2019-04-10 ENCOUNTER — Telehealth: Payer: Self-pay

## 2019-04-10 DIAGNOSIS — Z1211 Encounter for screening for malignant neoplasm of colon: Secondary | ICD-10-CM

## 2019-04-10 DIAGNOSIS — Z800 Family history of malignant neoplasm of digestive organs: Secondary | ICD-10-CM

## 2019-04-10 NOTE — Telephone Encounter (Signed)
Gastroenterology Pre-Procedure Review  Request Date: Friday 05/19/19 Requesting Physician: Dr. Vicente Males  PATIENT REVIEW QUESTIONS: The patient responded to the following health history questions as indicated:    1. Are you having any GI issues? no 2. Do you have a personal history of Polyps? no 3. Do you have a family history of Colon Cancer or Polyps? yes (sister colon cancer) 4. Diabetes Mellitus? no 5. Joint replacements in the past 12 months?no 6. Major health problems in the past 3 months?no 7. Any artificial heart valves, MVP, or defibrillator?no    MEDICATIONS & ALLERGIES:    Patient reports the following regarding taking any anticoagulation/antiplatelet therapy:   Plavix, Coumadin, Eliquis, Xarelto, Lovenox, Pradaxa, Brilinta, or Effient? no Aspirin? no  Patient confirms/reports the following medications:  Current Outpatient Medications  Medication Sig Dispense Refill  . levothyroxine (SYNTHROID) 150 MCG tablet TAKE 1 TABLET BY MOUTH EVERY DAY 90 tablet 1   No current facility-administered medications for this visit.    Patient confirms/reports the following allergies:  No Known Allergies  No orders of the defined types were placed in this encounter.   AUTHORIZATION INFORMATION Primary Insurance: 1D#: Group #:  Secondary Insurance: 1D#: Group #:  SCHEDULE INFORMATION: Date:  Time: Location:[facility]

## 2019-05-11 ENCOUNTER — Encounter: Payer: Self-pay | Admitting: Gastroenterology

## 2019-05-12 ENCOUNTER — Other Ambulatory Visit: Payer: Self-pay

## 2019-05-12 ENCOUNTER — Ambulatory Visit: Payer: 59 | Admitting: Urology

## 2019-05-12 ENCOUNTER — Encounter: Payer: Self-pay | Admitting: Urology

## 2019-05-12 VITALS — BP 156/100 | HR 108 | Ht 65.0 in | Wt 165.0 lb

## 2019-05-12 DIAGNOSIS — Z87898 Personal history of other specified conditions: Secondary | ICD-10-CM

## 2019-05-12 NOTE — Progress Notes (Signed)
05/12/2019 12:19 PM   Stephannie Li Sparr December 11, 1955 [id number]  Referring provider: Pleas Koch, NP [address],  [place] [address]  Chief Complaint  Patient presents with  . Elevated PSA    HPI: 64 year old male who presents today for further evaluation of elevated PSA.  He has a personal history of elevated/fluctuating PSA as high as 12 in 2019.  Most recent PSA 7.5 on 04/07/2019 up from the previous year at which time it was 6.55 in 05/2018.  He denies any urinary symptoms.  He reports that he voids well, is able to empty his bladder with a good stream.  No urgency or frequency.  He does get up twice at night to void but he drinks a copious amounts of fluid before going to bed.  He is not bothered by this.  No history of dysuria or pelvic pain.  No history of UTIs.  No weight loss or bone pain.  No family history of prostate cancer.   PMH: Past Medical History:  Diagnosis Date  . Arthritis    knees  . GERD (gastroesophageal reflux disease)   . Thyroid disease   . Wears dentures     Surgical History: Past Surgical History:  Procedure Laterality Date  . CHOLECYSTECTOMY  2018    Home Medications:  Allergies as of 05/12/2019   No Known Allergies     Medication List       Accurate as of May 12, 2019 12:19 PM. If you have any questions, ask your nurse or doctor.        levothyroxine 150 MCG tablet Commonly known as: SYNTHROID TAKE 1 TABLET BY MOUTH EVERY DAY       Allergies: No Known Allergies  Family History: Family History  Problem Relation Age of Onset  . Lung cancer Mother   . Diabetes Paternal Grandmother     Social History:  reports that he has quit smoking. He has never used smokeless tobacco. He reports that he does not drink alcohol or use drugs.   Physical Exam: BP (!) 156/100 (BP Location: Left Arm, Patient Position: Sitting, Cuff Size: Normal)   Pulse (!) 108   Ht 5\' 5"  (1.651 m)   Wt 165 lb (74.8 kg)   BMI 27.46  kg/m   Constitutional:  Alert and oriented, No acute distress. HEENT: [place] AT, moist mucus membranes.  Trachea midline, no masses. Cardiovascular: No clubbing, cyanosis, or edema. Respiratory: Normal respiratory effort, no increased work of breathing. GI: Abdomen is soft, nontender, nondistended, no abdominal masses Rectal: Normal sphincter tone.  50 cc prostate with prominent lateral edges bilaterally, rubbery rasised area at right base suspicious for BPH nodule. Skin: No rashes, bruises or suspicious lesions. Neurologic: Grossly intact, no focal deficits, moving all 4 extremities. Psychiatric: Normal mood and affect.  Laboratory Data: Lab Results  Component Value Date   WBC 9.1 04/07/2019   HGB 13.9 04/07/2019   HCT 42.8 04/07/2019   MCV 76.3 (L) 04/07/2019   PLT 389 04/07/2019    Lab Results  Component Value Date   CREATININE 1.10 04/07/2019    Lab Results  Component Value Date   PSA 7.5 (H) 04/07/2019   PSA 6.55 (H) 06/09/2018     Lab Results  Component Value Date   HGBA1C 6.1 (H) 04/07/2019   Assessment & Plan:    1. History of elevated PSA Personal history of elevated/fluctuating PSA  He is otherwise asymptomatic and rectal exam today is more suspicious for  BPH then malignancy   We reviewed the implications of an elevated PSA and the uncertainty surrounding it. In general, a man's PSA increases with age and is produced by both normal and cancerous prostate tissue. Differential for elevated PSA is BPH, prostate cancer, infection, recent intercourse/ejaculation, prostate infarction, recent urethroscopic manipulation (foley placement/cystoscopy) and prostatitis. Management of an elevated PSA can include observation or prostate biopsy and wediscussed this in detail. We discussed that indications for prostate biopsy are defined by age and race specific PSA cutoffs as well as a PSA velocity of 0.75/year.  We discussed alternative options including very close follow-up  with serial PSAs, prostate biopsy and prostate MRI his options.  He is most interested in prostate MRI.  He understands that if a lesion is identified, he will ultimately need a biopsy.  Plan for virtual follow-up in about 4 weeks to follow these results.  All questions answered. - MR PROSTATE W WO CONTRAST; Future  Hollice Espy, MD  [facility] [address], [place], [place] [address] [phone]

## 2019-05-13 ENCOUNTER — Other Ambulatory Visit: Payer: Self-pay | Admitting: Primary Care

## 2019-05-13 DIAGNOSIS — E039 Hypothyroidism, unspecified: Secondary | ICD-10-CM

## 2019-05-17 ENCOUNTER — Other Ambulatory Visit
Admission: RE | Admit: 2019-05-17 | Discharge: 2019-05-17 | Disposition: A | Discharge status: Home / Self Care | Payer: 59 | Source: Ambulatory Visit | Attending: Gastroenterology | Admitting: Gastroenterology

## 2019-05-17 DIAGNOSIS — Z20822 Contact with and (suspected) exposure to covid-19: Secondary | ICD-10-CM | POA: Insufficient documentation

## 2019-05-17 DIAGNOSIS — Z01812 Encounter for preprocedural laboratory examination: Secondary | ICD-10-CM | POA: Insufficient documentation

## 2019-05-17 LAB — SARS CORONAVIRUS 2 (TAT 6-24 HRS): SARS Coronavirus 2: NEGATIVE

## 2019-05-18 NOTE — Discharge Instructions (Signed)
General Anesthesia, Adult, Care After This sheet gives you information about how to care for yourself after your procedure. Your health care provider may also give you more specific instructions. If you have problems or questions, contact your health care provider. What can I expect after the procedure? After the procedure, the following side effects are common:  Pain or discomfort at the IV site.  Nausea.  Vomiting.  Sore throat.  Trouble concentrating.  Feeling cold or chills.  Weak or tired.  Sleepiness and fatigue.  Soreness and body aches. These side effects can affect parts of the body that were not involved in surgery. Follow these instructions at home:  For at least 24 hours after the procedure:  Have a responsible adult stay with you. It is important to have someone help care for you until you are awake and alert.  Rest as needed.  Do not: ? Participate in activities in which you could fall or become injured. ? Drive. ? Use heavy machinery. ? Drink alcohol. ? Take sleeping pills or medicines that cause drowsiness. ? Make important decisions or sign legal documents. ? Take care of children on your own. Eating and drinking  Follow any instructions from your health care provider about eating or drinking restrictions.  When you feel hungry, start by eating small amounts of foods that are soft and easy to digest (bland), such as toast. Gradually return to your regular diet.  Drink enough fluid to keep your urine pale yellow.  If you vomit, rehydrate by drinking water, juice, or clear broth. General instructions  If you have sleep apnea, surgery and certain medicines can increase your risk for breathing problems. Follow instructions from your health care provider about wearing your sleep device: ? Anytime you are sleeping, including during daytime naps. ? While taking prescription pain medicines, sleeping medicines, or medicines that make you drowsy.  Return to  your normal activities as told by your health care provider. Ask your health care provider what activities are safe for you.  Take over-the-counter and prescription medicines only as told by your health care provider.  If you smoke, do not smoke without supervision.  Keep all follow-up visits as told by your health care provider. This is important. Contact a health care provider if:  You have nausea or vomiting that does not get better with medicine.  You cannot eat or drink without vomiting.  You have pain that does not get better with medicine.  You are unable to pass urine.  You develop a skin rash.  You have a fever.  You have redness around your IV site that gets worse. Get help right away if:  You have difficulty breathing.  You have chest pain.  You have blood in your urine or stool, or you vomit blood. Summary  After the procedure, it is common to have a sore throat or nausea. It is also common to feel tired.  Have a responsible adult stay with you for the first 24 hours after general anesthesia. It is important to have someone help care for you until you are awake and alert.  When you feel hungry, start by eating small amounts of foods that are soft and easy to digest (bland), such as toast. Gradually return to your regular diet.  Drink enough fluid to keep your urine pale yellow.  Return to your normal activities as told by your health care provider. Ask your health care provider what activities are safe for you. This information is not   intended to replace advice given to you by your health care provider. Make sure you discuss any questions you have with your health care provider. Document Revised: 03/12/2017 Document Reviewed: 10/23/2016 Elsevier Patient Education  2020 [employer]  

## 2019-05-19 ENCOUNTER — Encounter: Payer: Self-pay | Admitting: Gastroenterology

## 2019-05-19 ENCOUNTER — Ambulatory Visit: Payer: 59 | Admitting: Anesthesiology

## 2019-05-19 ENCOUNTER — Ambulatory Visit
Admission: RE | Admit: 2019-05-19 | Discharge: 2019-05-19 | Disposition: A | Discharge status: Home / Self Care | Payer: 59 | Attending: Gastroenterology | Admitting: Gastroenterology

## 2019-05-19 ENCOUNTER — Encounter
Admission: RE | Disposition: A | Discharge status: Home / Self Care | Payer: Self-pay | Source: Home / Self Care | Attending: Gastroenterology

## 2019-05-19 ENCOUNTER — Other Ambulatory Visit: Payer: Self-pay

## 2019-05-19 DIAGNOSIS — D120 Benign neoplasm of cecum: Secondary | ICD-10-CM | POA: Insufficient documentation

## 2019-05-19 DIAGNOSIS — Z79890 Hormone replacement therapy (postmenopausal): Secondary | ICD-10-CM | POA: Diagnosis not present

## 2019-05-19 DIAGNOSIS — D122 Benign neoplasm of ascending colon: Secondary | ICD-10-CM | POA: Insufficient documentation

## 2019-05-19 DIAGNOSIS — K621 Rectal polyp: Secondary | ICD-10-CM | POA: Insufficient documentation

## 2019-05-19 DIAGNOSIS — Z1211 Encounter for screening for malignant neoplasm of colon: Secondary | ICD-10-CM | POA: Insufficient documentation

## 2019-05-19 DIAGNOSIS — E039 Hypothyroidism, unspecified: Secondary | ICD-10-CM | POA: Diagnosis not present

## 2019-05-19 DIAGNOSIS — D126 Benign neoplasm of colon, unspecified: Secondary | ICD-10-CM | POA: Diagnosis not present

## 2019-05-19 DIAGNOSIS — Z87891 Personal history of nicotine dependence: Secondary | ICD-10-CM | POA: Diagnosis not present

## 2019-05-19 DIAGNOSIS — Z800 Family history of malignant neoplasm of digestive organs: Secondary | ICD-10-CM | POA: Insufficient documentation

## 2019-05-19 HISTORY — PX: POLYPECTOMY: SHX5525

## 2019-05-19 HISTORY — DX: Unspecified osteoarthritis, unspecified site: M19.90

## 2019-05-19 HISTORY — PX: COLONOSCOPY WITH PROPOFOL: SHX5780

## 2019-05-19 HISTORY — DX: Presence of dental prosthetic device (complete) (partial): Z97.2

## 2019-05-19 SURGERY — COLONOSCOPY WITH PROPOFOL
Anesthesia: General | Site: Rectum

## 2019-05-19 MED ORDER — SODIUM CHLORIDE 0.9 % IV SOLN
INTRAVENOUS | Status: DC | PRN
  Administered 2019-05-19 (×4): 100 ug via INTRAVENOUS

## 2019-05-19 MED ORDER — STERILE WATER FOR IRRIGATION IR SOLN
Status: DC | PRN
  Administered 2019-05-19: .05 mL

## 2019-05-19 MED ORDER — LACTATED RINGERS IV SOLN: INTRAVENOUS | Status: DC

## 2019-05-19 MED ORDER — ONDANSETRON HCL 4 MG/2ML IJ SOLN: 4.0000 mg | Freq: Once | INTRAMUSCULAR | Status: DC | PRN

## 2019-05-19 MED ORDER — LIDOCAINE HCL (CARDIAC) PF 100 MG/5ML IV SOSY
PREFILLED_SYRINGE | INTRAVENOUS | Status: DC | PRN
  Administered 2019-05-19: 20 mg via INTRAVENOUS

## 2019-05-19 MED ORDER — PROPOFOL 10 MG/ML IV BOLUS
INTRAVENOUS | Status: DC | PRN
  Administered 2019-05-19: 20 mg via INTRAVENOUS
  Administered 2019-05-19: 10 mg via INTRAVENOUS
  Administered 2019-05-19 (×8): 20 mg via INTRAVENOUS
  Administered 2019-05-19: 90 mg via INTRAVENOUS
  Administered 2019-05-19 (×6): 20 mg via INTRAVENOUS

## 2019-05-19 SURGICAL SUPPLY — 16 items
CANISTER SUCT 1200ML W/VALVE (MISCELLANEOUS) ×3 IMPLANT
CLIP HMST 235XBRD CATH ROT (MISCELLANEOUS) ×4 IMPLANT
CLIP RESOLUTION 360 11X235 (MISCELLANEOUS) ×2
ELECT REM PT RETURN 9FT ADLT (ELECTROSURGICAL) ×3
ELECTRODE REM PT RTRN 9FT ADLT (ELECTROSURGICAL) ×2 IMPLANT
ELEVIEW  INJECTABLE COMP 10 (MISCELLANEOUS) ×2
GOWN CVR UNV OPN BCK APRN NK (MISCELLANEOUS) ×4 IMPLANT
GOWN ISOL THUMB LOOP REG UNIV (MISCELLANEOUS) ×2
INJECTABLE ELEVIEW COMP 10 (MISCELLANEOUS) ×4 IMPLANT
KIT ENDO PROCEDURE OLY (KITS) ×3 IMPLANT
NEEDLE CARR LOCKE SCLERO (NEEDLE) ×3 IMPLANT
RETRIEVER NET ROTH 2.5X230 LF (MISCELLANEOUS) ×3 IMPLANT
SNARE SHORT THROW 13M SML OVAL (MISCELLANEOUS) ×3 IMPLANT
SYR 10ML LL (SYRINGE) ×3 IMPLANT
TRAP ETRAP POLY (MISCELLANEOUS) ×3 IMPLANT
WATER STERILE IRR 250ML POUR (IV SOLUTION) ×3 IMPLANT

## 2019-05-19 NOTE — Anesthesia Preprocedure Evaluation (Signed)
Anesthesia Evaluation  Patient identified by MRN, date of birth, ID band Patient awake    Reviewed: Allergy & Precautions, NPO status   Airway Mallampati: II  TM Distance: >3 FB     Dental   Pulmonary former smoker,    breath sounds clear to auscultation       Cardiovascular  Rhythm:Regular Rate:Normal     Neuro/Psych    GI/Hepatic GERD  ,  Endo/Other  Hypothyroidism   Renal/GU      Musculoskeletal  (+) Arthritis ,   Abdominal   Peds  Hematology   Anesthesia Other Findings   Reproductive/Obstetrics                            Anesthesia Physical Anesthesia Plan  ASA: II  Anesthesia Plan: General   Post-op Pain Management:    Induction: Intravenous  PONV Risk Score and Plan: TIVA  Airway Management Planned: Nasal Cannula and Natural Airway  Additional Equipment:   Intra-op Plan:   Post-operative Plan:   Informed Consent: I have reviewed the patients History and Physical, chart, labs and discussed the procedure including the risks, benefits and alternatives for the proposed anesthesia with the patient or authorized representative who has indicated his/her understanding and acceptance.       Plan Discussed with: CRNA  Anesthesia Plan Comments:         Anesthesia Quick Evaluation

## 2019-05-19 NOTE — H&P (Signed)
[name], MD [address] [place], [place], [place], [id number], [place], [place], [place], [address] Phone: [phone]  Fax: [phone]  Primary Care Physician:  Pleas Koch, NP   Pre-Procedure History & Physical: HPI:  Tony Hernandez is a 64 y.o. male is here for an colonoscopy.   Past Medical History:  Diagnosis Date  . Arthritis    knees  . GERD (gastroesophageal reflux disease)   . Thyroid disease   . Wears dentures     Past Surgical History:  Procedure Laterality Date  . CHOLECYSTECTOMY  2018    Prior to Admission medications   Medication Sig Start Date End Date Taking? Authorizing Provider  levothyroxine (SYNTHROID) 150 MCG tablet TAKE 1 TABLET BY MOUTH EVERY DAY 05/15/19  Yes Pleas Koch, NP    Allergies as of 04/10/2019  . (No Known Allergies)    Family History  Problem Relation Age of Onset  . Lung cancer Mother   . Diabetes Paternal Grandmother     Social History   Socioeconomic History  . Marital status: Single    Spouse name: Not on file  . Number of children: Not on file  . Years of education: Not on file  . Highest education level: Not on file  Occupational History  . Not on file  Tobacco Use  . Smoking status: Former Research scientist (life sciences)  . Smokeless tobacco: Never Used  Substance and Sexual Activity  . Alcohol use: No    Alcohol/week: 0.0 standard drinks  . Drug use: No  . Sexual activity: Yes    Birth control/protection: None  Other Topics Concern  . Not on file  Social History Narrative   Single.   2 children.   Works as a Administrator.   Enjoys spending time with family.   Social Determinants of Health   Financial Resource Strain:   . Difficulty of Paying Living Expenses: Not on file  Food Insecurity:   . Worried About Charity fundraiser in the Last Year: Not on file  . Ran Out of Food in the Last Year: Not on file  Transportation Needs:   . Lack of Transportation (Medical): Not on file  .  Lack of Transportation (Non-Medical): Not on file  Physical Activity:   . Days of Exercise per Week: Not on file  . Minutes of Exercise per Session: Not on file  Stress:   . Feeling of Stress : Not on file  Social Connections:   . Frequency of Communication with Friends and Family: Not on file  . Frequency of Social Gatherings with Friends and Family: Not on file  . Attends Religious Services: Not on file  . Active Member of Clubs or Organizations: Not on file  . Attends Archivist Meetings: Not on file  . Marital Status: Not on file  Intimate Partner Violence:   . Fear of Current or Ex-Partner: Not on file  . Emotionally Abused: Not on file  . Physically Abused: Not on file  . Sexually Abused: Not on file    Review of Systems: See HPI, otherwise negative ROS  Physical Exam: BP (!) 134/94   Pulse 81   Temp 97.8 F (36.6 C) (Temporal)   Resp 16   Ht 5\' 5"  (1.651 m)   Wt 76.7 kg   SpO2 98%   BMI 28.12 kg/m  General:   Alert,  pleasant and cooperative in NAD Head:  Normocephalic and atraumatic. Neck:  Supple; no masses or thyromegaly. Lungs:  Clear throughout to auscultation, normal respiratory effort.    Heart:  +S1, +S2, Regular rate and rhythm, No edema. Abdomen:  Soft, nontender and nondistended. Normal bowel sounds, without guarding, and without rebound.   Neurologic:  Alert and  oriented x4;  grossly normal neurologically.  Impression/Plan: Tony Hernandez is here for an colonoscopy to be performed for Screening colonoscopy first degree relative had colon cancer in 50's Risks, benefits, limitations, and alternatives regarding  colonoscopy have been reviewed with the patient.  Questions have been answered.  All parties agreeable.   [name], MD  05/19/2019, 8:57 AM

## 2019-05-19 NOTE — Anesthesia Procedure Notes (Signed)
Procedure Name: MAC Date/Time: 05/19/2019 9:03 AM Performed by: Vanetta Shawl, CRNA Pre-anesthesia Checklist: Patient identified, Emergency Drugs available, Suction available, Timeout performed and Patient being monitored Patient Re-evaluated:Patient Re-evaluated prior to induction Oxygen Delivery Method: Nasal cannula Placement Confirmation: positive ETCO2

## 2019-05-19 NOTE — Op Note (Signed)
[facility] Patient Name: Tony Hernandez Procedure Date: 05/19/2019 8:59 AM MRN: [id number] Account #: [id number] Date of Birth: 04/18/1955 Admit Type: Outpatient Age: 64 Room: [facility] ROOM 01 Gender: Male Note Status: Finalized Procedure:             Colonoscopy Indications:           Screening in patient at increased risk: Family history                         of 1st-degree relative with colorectal cancer before                         age 76 years Providers:             [name] MD, MD Referring MD:          Pleas Koch (Referring MD) Medicines:             Monitored Anesthesia Care Complications:         No immediate complications. Procedure:             Pre-Anesthesia Assessment:                        - Prior to the procedure, a History and Physical was                         performed, and patient medications, allergies and                         sensitivities were reviewed. The patient's tolerance                         of previous anesthesia was reviewed.                        - The risks and benefits of the procedure and the                         sedation options and risks were discussed with the                         patient. All questions were answered and informed                         consent was obtained.                        - ASA Grade Assessment: II - A patient with mild                         systemic disease.                        After obtaining informed consent, the colonoscope was                         passed under direct vision. Throughout the procedure,                         the patient's blood pressure, pulse, and  oxygen                         saturations were monitored continuously. The was                         introduced through the anus and advanced to the the                         cecum, identified by the appendiceal orifice. The                         colonoscopy was performed without  difficulty. The                         patient tolerated the procedure well. The quality of                         the bowel preparation was good. Findings:      The perianal and digital rectal examinations were normal.      A 10 mm polyp was found in the rectum. The polyp was sessile. The polyp       was removed with a cold snare. Resection and retrieval were complete.      A 20 mm polyp was found in the cecum. The polyp was sessile.       Preparations were made for mucosal resection. Eleview was injected to       raise the lesion. Snare mucosal resection was performed. Resection was       complete, and retrieval was complete. To prevent bleeding after mucosal       resection, two hemostatic clips were successfully placed. There was no       bleeding during, Tony at the end, of the procedure.      Two sessile polyps were found in the proximal ascending colon. The       polyps were 10 to 17 mm in size. These polyps were removed with a       piecemeal technique using a hot snare. Resection was complete, and       retrieval was complete. To prevent bleeding after mucosal resection, one       hemostatic clip was successfully placed. There was no bleeding during,       Tony at the end, of the procedure. Eleview 10 cc used to lift both polyps      The exam was otherwise without abnormality. Impression:            - One 10 mm polyp in the rectum, removed with a cold                         snare. Resected and retrieved.                        - One 20 mm polyp in the cecum, removed with mucosal                         resection. Resected and retrieved. Clips were placed.                        - Two 10 to 17 mm polyps in the  proximal ascending                         colon, removed piecemeal using a hot snare. Resected                         and retrieved. Clip was placed.                        - The examination was otherwise normal.                        - Mucosal resection was performed.  Resection was                         complete, and retrieval was complete. Recommendation:        - Discharge patient to home (with escort).                        - Resume previous diet.                        - Continue present medications.                        - Await pathology results.                        - Repeat colonoscopy in 5 months for surveillance                         after piecemeal polypectomy. Procedure Code(s):     --- Professional ---                        [phone], Colonoscopy, flexible; with endoscopic mucosal                         resection                        [id number], 42, Colonoscopy, flexible; with removal of                         tumor(s), polyp(s), Tony other lesion(s) by snare                         technique Diagnosis Code(s):     --- Professional ---                        Z80.0, Family history of malignant neoplasm of                         digestive organs                        K62.1, Rectal polyp                        K63.5, Polyp of colon CPT copyright 2019 American Medical Association. All rights reserved. The codes documented in this report are preliminary and upon coder review may  be revised to meet  current compliance requirements. [name], MD [name] MD, MD 05/19/2019 9:51:37 AM This report has been signed electronically. Number of Addenda: 0 Note Initiated On: 05/19/2019 8:59 AM Total Procedure Duration: 0 hours 39 minutes 53 seconds  Estimated Blood Loss:  Estimated blood loss: none.      [facility]

## 2019-05-19 NOTE — Anesthesia Postprocedure Evaluation (Signed)
Anesthesia Post Note  Patient: Novel Glassco Raben  Procedure(s) Performed: COLONOSCOPY WITH PROPOFOL (N/A Rectum) POLYPECTOMY (Rectum)     Patient location during evaluation: PACU Anesthesia Type: General Level of consciousness: awake Pain management: pain level controlled Vital Signs Assessment: post-procedure vital signs reviewed and stable Respiratory status: respiratory function stable Cardiovascular status: stable Postop Assessment: no signs of nausea or vomiting Anesthetic complications: no    Veda Canning

## 2019-05-19 NOTE — Transfer of Care (Signed)
Immediate Anesthesia Transfer of Care Note  Patient: Triton Altice Cartmell  Procedure(s) Performed: COLONOSCOPY WITH PROPOFOL (N/A Rectum) POLYPECTOMY (Rectum)  Patient Location: PACU  Anesthesia Type: General  Level of Consciousness: awake, alert  and patient cooperative  Airway and Oxygen Therapy: Patient Spontanous Breathing and Patient connected to supplemental oxygen  Post-op Assessment: Post-op Vital signs reviewed, Patient's Cardiovascular Status Stable, Respiratory Function Stable, Patent Airway and No signs of Nausea or vomiting  Post-op Vital Signs: Reviewed and stable  Complications: No apparent anesthesia complications

## 2019-05-24 LAB — SURGICAL PATHOLOGY

## 2019-05-25 NOTE — Progress Notes (Signed)
Jadijah inform needs office visit to discuss results. Dysplasia seen in the polyps- please schedule   C/c Pleas Koch, NP   Bailey Mech

## 2019-05-26 ENCOUNTER — Telehealth: Payer: Self-pay

## 2019-05-26 ENCOUNTER — Ambulatory Visit: Admission: RE | Admit: 2019-05-26 | Payer: 59 | Source: Ambulatory Visit

## 2019-05-26 NOTE — Telephone Encounter (Signed)
-----   Message from [name], MD sent at 05/25/2019  9:55 AM EST ----- Sherald Hess inform needs office visit to discuss results. Dysplasia seen in the polyps- please schedule   C/c Pleas Koch, NP   Bailey Mech

## 2019-05-26 NOTE — Telephone Encounter (Signed)
Patient verbalized understanding of results. Made follow up appointment on 06/14/2019

## 2019-06-08 NOTE — Progress Notes (Signed)
Virtual Visit via Telephone Note  I connected with Tony Hernandez on 06/09/19 at 3:00 PM EDT by telephone and verified that I am speaking with the correct person using two identifiers.  Location: Patient: Home Provider: Office   I discussed the limitations, risks, security and privacy concerns of performing an evaluation and management service by telephone and the availability of in person appointments. I also discussed with the patient that there may be a patient responsible charge related to this service. The patient expressed understanding and agreed to proceed.  History of Present Illness: Tony Hernandez is a 64 y.o. African American M who returns today for a 4 week f/u for the evaluation and management of elevated PSA.   He has a personal history of elevated/fluctuating PSA as high as 12 in 2019.  Most recent PSA 7.5 on 04/07/2019 up from the previous year at which time it was 6.55 in 05/2018. PSA trend below.   Personally discussed MRI with Dr. Jacalyn Lefevre regarding MRI finding.   Most recent MRI indicated two regions suspicious for prostate cancer at [facility] category 4 designation. No enlarged lymph nodes or metastasis outside of prostate noted. Nodular BPH enlarged. Obscured by post biopsy hemorrhage however no biopsy performed.   No history of dysuria or pelvic pain.  No history of UTIs.  No weight loss or bone pain.  No family history of prostate cancer.  PSA Trend:  12, 2019  6.55, 05/2018 7.5 04/07/19   Observations/Objective: Pt is engaged and asking good questions.   Pertinent Imagings:  CLINICAL DATA:  Elevated PSA as high as 12 in 2019 with most recent PSA of 7.5.  EXAM: MR PROSTATE WITHOUT AND WITH CONTRAST  TECHNIQUE: Multiplanar multisequence MRI images were obtained of the pelvis centered about the prostate. Pre and post contrast images were obtained.  CONTRAST:  7.72mL GADAVIST GADOBUTROL 1 MMOL/ML IV SOLN  COMPARISON:  None  FINDINGS: Prostate: Evidence  of BPH. Wedge-shaped areas in the peripheral zone with increased T1 signal suggestive of hemorrhage from prior biopsy.  Peripheral zone: Areas of added T1 signal as described. These areas reflect foci of hemorrhage presumably post biopsy. Area of more rounded abnormality on the ADC (image 17, series 9) also displaying restricted diffusion on high B value images but contiguous with linear area in the gland felt to be related to hemorrhage. PIRADS category 4 based on potential exclusion. Region 1  10 mm area (image 20, series 9) in the left lateral apex of the prostate at approximately the 3 to 5 o'clock position also displaying low signal on ADC and high signal on calculated high B value images. Not definitely corresponding to an area of hemorrhage. PIRADS category 4 region 2  Transitional zone: Diffuse low signal in the transitional zone with circumscribed margins presumed BPH but without the discrete smaller areas of nodular change that is expected. Left and right gland with more macro nodular appearance and scattered areas of increased T2 signal. These may represent large stroma rich BPH nodules displaying uniform diffusion related signal and evidence of restricted diffusion on high B value diffusion.  Volume: 63.4 cc  Transcapsular spread:  Present/absent/other  Seminal vesicle involvement: present/absent/other  Neurovascular bundle involvement: present/absent/other  Pelvic adenopathy: Absent  Bone metastasis: Absent  Other findings: None  IMPRESSION: Two regions that are suspicious for prostate cancer at PIRADS category 4 designation. Imaging confounded by the presence of presumed post biopsy hemorrhage within the peripheral zone.  Changes of BPH with very low signal throughout  the central gland with intermixed areas of high T2 signal such as would be expected in the setting of large BPH nodules perhaps diffuse stromal rich changes of BPH. Diffuse  neoplasm is felt to be unlikely though if biopsy has not been performed as is suggested based on changes in the peripheral zone areas throughout the transitional zone could be sampled to exclude this possibility.  No signs of extracapsular extension or neurovascular bundle involvement.   Electronically Signed   By: Zetta Bills M.D.   On: 06/09/2019 13:22   I have personally reviewed the images and discussed with Dr. Jacalyn Lefevre regarding interpretation.   Assessment and Plan: 1. History of elevated PSA  Personal history of elevated/fluctuating PSA MRI indicates prostate cancer at PIRADS category 4 designation confounded by post biopsy hemorrhage however no biopsy performed  Discussed MRI findings with Dr. Jacalyn Lefevre (radiologist)  Will discuss with radiologist for further evaluation and their level of concern, will recommend fusion biopsy.  We discussed prostate biopsy in detail including the procedure itself, the risks of blood in the urine, stool, and ejaculate, serious infection, and discomfort. He is willing to proceed with this as discussed.  Follow Up Instructions:    I discussed the assessment and treatment plan with the patient. The patient was provided an opportunity to ask questions and all were answered. The patient agreed with the plan and demonstrated an understanding of the instructions.   The patient was advised to call back or seek an in-person evaluation if the symptoms worsen or if the condition fails to improve as anticipated.  I provided 20 minutes of non-face-to-face time during this encounter.   Dolores Frame, am acting as a scribe for Dr. Hollice Espy,  I have reviewed the above documentation for accuracy and completeness, and I agree with the above.   Hollice Espy, MD

## 2019-06-09 ENCOUNTER — Ambulatory Visit
Admission: RE | Admit: 2019-06-09 | Discharge: 2019-06-09 | Disposition: A | Discharge status: Home / Self Care | Payer: 59 | Source: Ambulatory Visit | Attending: Urology | Admitting: Urology

## 2019-06-09 ENCOUNTER — Telehealth (INDEPENDENT_AMBULATORY_CARE_PROVIDER_SITE_OTHER): Payer: 59 | Admitting: Urology

## 2019-06-09 ENCOUNTER — Other Ambulatory Visit: Payer: Self-pay

## 2019-06-09 DIAGNOSIS — Z87898 Personal history of other specified conditions: Secondary | ICD-10-CM | POA: Insufficient documentation

## 2019-06-09 DIAGNOSIS — R972 Elevated prostate specific antigen [PSA]: Secondary | ICD-10-CM | POA: Diagnosis not present

## 2019-06-09 LAB — POCT I-STAT CREATININE: Creatinine, Ser: 0.9 mg/dL (ref 0.61–1.24)

## 2019-06-09 MED ORDER — GADOBUTROL 1 MMOL/ML IV SOLN
7.5000 mL | Freq: Once | INTRAVENOUS | Status: AC | PRN
  Administered 2019-06-09: 7.5 mL via INTRAVENOUS

## 2019-06-14 ENCOUNTER — Other Ambulatory Visit: Payer: Self-pay

## 2019-06-14 ENCOUNTER — Ambulatory Visit: Payer: 59 | Admitting: Gastroenterology

## 2019-06-14 ENCOUNTER — Telehealth: Payer: Self-pay | Admitting: Urology

## 2019-06-14 VITALS — BP 146/91 | HR 83 | Temp 98.2°F | Ht 65.0 in | Wt 174.2 lb

## 2019-06-14 DIAGNOSIS — Z87898 Personal history of other specified conditions: Secondary | ICD-10-CM

## 2019-06-14 DIAGNOSIS — Z86010 Personal history of colonic polyps: Secondary | ICD-10-CM | POA: Diagnosis not present

## 2019-06-14 NOTE — Telephone Encounter (Signed)
Case was discussed further with radiology who amended the report.  Based on this discussion, would in fact recommend pursuing fusion biopsy.  I tried reaching out to the patient via his contact number but was unable to reach him.  No voicemail was set up.  We went ahead and placed a fusion biopsy referral.  Please continue to try to contact him.  He is aware that this was one of the possible recommendations.  We did go ahead and review the risk of prostate biopsy at length during her phone visit on Friday.  All questions were previously answered.  Personally continue to try to reach him today.  Hollice Espy, MD

## 2019-06-14 NOTE — Progress Notes (Signed)
[name] MD, MRCP(U.K) [address]  [place]  [place], [place] [address]  Main: [phone]  Fax: [phone]   Gastroenterology Consultation  Referring Provider:     Pleas Koch, NP Primary Care Physician:  Pleas Koch, NP Primary Gastroenterologist:  Dr. [name]  Reason for Consultation:     Discuss results of biopsy        HPI:   Tony Hernandez is a 64 y.o. y/o male  Underwent a screening colonoscopy on 05/19/2019 A 10 mm polyp was found in the rectum [hyperplastic polyp] .A 20 mm polyp was found in the cecum. The polyp was sessile.Preparations were made for mucosal resection. Eleview was injected to raise the lesion. Snare mucosal resection was performed.Resection was complete, and retrieval was complete. Two sessile polyps were found in the proximal ascending colon. The polyps were 10 to 17 mm in size. These polyps were removedwith a piecemeal technique using a hot snare. Resection was complete, and retrieval was complete.    The cecal and ascending colon polyps were seen in a single bottle and they showed tubular adenomas with multifocal high-grade dysplasia.  Low-grade dysplasia present at the cauterized polyp stalk in 3 of multiple sections.  He is here to discuss results.  He states that he did well after his procedure.  He has a family history of colon cancer in his sister.  Past Medical History:  Diagnosis Date  . Arthritis    knees  . GERD (gastroesophageal reflux disease)   . Thyroid disease   . Wears dentures     Past Surgical History:  Procedure Laterality Date  . CHOLECYSTECTOMY  2018  . COLONOSCOPY WITH PROPOFOL N/A 05/19/2019   Procedure: COLONOSCOPY WITH PROPOFOL;  Surgeon: [name], MD;  Location: [place];  Service: Endoscopy;  Laterality: N/A;  Priority 4  . POLYPECTOMY  05/19/2019   Procedure: POLYPECTOMY;  Surgeon: [name], MD;  Location: [place];  Service: Endoscopy;;    Prior to Admission  medications   Medication Sig Start Date End Date Taking? Authorizing Provider  levothyroxine (SYNTHROID) 150 MCG tablet TAKE 1 TABLET BY MOUTH EVERY DAY 05/15/19   Pleas Koch, NP    Family History  Problem Relation Age of Onset  . Lung cancer Mother   . Diabetes Paternal Grandmother      Social History   Tobacco Use  . Smoking status: Former Research scientist (life sciences)  . Smokeless tobacco: Never Used  Substance Use Topics  . Alcohol use: No    Alcohol/week: 0.0 standard drinks  . Drug use: No    Allergies as of 06/14/2019  . (No Known Allergies)    Review of Systems:    All systems reviewed and negative except where noted in HPI.   Physical Exam:  There were no vitals taken for this visit. No LMP for male patient. Psych:  Alert and cooperative. Normal mood and affect. General:   Alert,  Well-developed, well-nourished, pleasant and cooperative in NAD Head:  Normocephalic and atraumatic. Psych:  Alert and cooperative. Normal mood and affect.  Imaging Studies: MR PROSTATE W WO CONTRAST  Addendum Date: 06/09/2019   ADDENDUM REPORT: 06/09/2019 16:02 ADDENDUM: By report after discussing the case with the provider, the patient has not had prior biopsy. Areas in the prostate may represent slightly atypical appearance of sequela of prostatitis. Though given the increased T1 signal hemorrhagic or proteinaceous changes in these areas are considered. An area not previously marked in the right  hemi prostate particularly on sagittal view is slightly more mass-like than areas on the left particularly when viewed in the sagittal plane. PIRADS category 3 (image 6 of series 7) this area measures approximately 8 x 8 mm, does show restricted diffusion but on some sequences is wedge-shaped. This area will be marked as region 3, areas in the axial plane which display less wedge-shaped morphology are marked. The area marked as region 1 on the prior imaging study is downgraded to PIRADS category 3, rounded nature  of the area on the ADC map is of moderate suspicion but it is also seen within areas of increased T1 signal and wedge-shaped and linear changes. The area previously marked as region 2 is downgraded to PIRADS category 2 with wedge-shaped appearance favored to represent sequela of prostatitis. Diffuse stromal rich BPH changes are favored in the transitional zone. Electronically Signed   By: Zetta Bills M.D.   On: 06/09/2019 16:02   Result Date: 06/09/2019 CLINICAL DATA:  Elevated PSA as high as 12 in 2019 with most recent PSA of 7.5. EXAM: MR PROSTATE WITHOUT AND WITH CONTRAST TECHNIQUE: Multiplanar multisequence MRI images were obtained of the pelvis centered about the prostate. Pre and post contrast images were obtained. CONTRAST:  7.62mL GADAVIST GADOBUTROL 1 MMOL/ML IV SOLN COMPARISON:  None FINDINGS: Prostate: Evidence of BPH. Wedge-shaped areas in the peripheral zone with increased T1 signal suggestive of hemorrhage from prior biopsy. Peripheral zone: Areas of added T1 signal as described. These areas reflect foci of hemorrhage presumably post biopsy. Area of more rounded abnormality on the ADC (image 17, series 9) also displaying restricted diffusion on high B value images but contiguous with linear area in the gland felt to be related to hemorrhage. PIRADS category 4 based on potential exclusion. Region 1 10 mm area (image 20, series 9) in the left lateral apex of the prostate at approximately the 3 to 5 o'clock position also displaying low signal on ADC and high signal on calculated high B value images. Not definitely corresponding to an area of hemorrhage. PIRADS category 4 region 2 Transitional zone: Diffuse low signal in the transitional zone with circumscribed margins presumed BPH but without the discrete smaller areas of nodular change that is expected. Left and right gland with more macro nodular appearance and scattered areas of increased T2 signal. These may represent large stroma rich BPH nodules  displaying uniform diffusion related signal and evidence of restricted diffusion on high B value diffusion. Volume: 63.4 cc Transcapsular spread:  Present/absent/other Seminal vesicle involvement: present/absent/other Neurovascular bundle involvement: present/absent/other Pelvic adenopathy: Absent Bone metastasis: Absent Other findings: None IMPRESSION: Two regions that are suspicious for prostate cancer at PIRADS category 4 designation. Imaging confounded by the presence of presumed post biopsy hemorrhage within the peripheral zone. Changes of BPH with very low signal throughout the central gland with intermixed areas of high T2 signal such as would be expected in the setting of large BPH nodules perhaps diffuse stromal rich changes of BPH. Diffuse neoplasm is felt to be unlikely though if biopsy has not been performed as is suggested based on changes in the peripheral zone areas throughout the transitional zone could be sampled to exclude this possibility. No signs of extracapsular extension or neurovascular bundle involvement. Electronically Signed: By: Zetta Bills M.D. On: 06/09/2019 13:22    Assessment and Plan:   Tony Hernandez is a 64 y.o. y/o male is here today to see me to discuss results of recent screening colonoscopy where he  had multiple polyps resected in the cecum and ascending colon which demonstrated tubular adenoma with multifocal high-grade dysplasia.  Low-grade dysplasia was present at the cauterized polyp stalk in 3 of the multiple sections.  I explained to him that it was just bordering on the margins of cancer.  Based on the pathology report cannot be 100% sure that the entire polyp has been resected.  Although endoscopically it appeared that it had been done.  Explained that there are only 2 options at this point that are either close surveillance with colonoscopy or surgery.  I will refer him to Dr. Tasia Catchings in oncology to get her opinion to discuss at the multidisciplinary meeting with the  best step moving forward.  I have suggested to have his children screened for colon cancer at 0 3-40.  Follow up in 5 to 6 months  Dr [name] MD,MRCP(U.K)

## 2019-06-14 NOTE — Addendum Note (Signed)
Addended by: Dorethea Clan on: 06/14/2019 10:03 AM   Modules accepted: Orders

## 2019-06-15 NOTE — Telephone Encounter (Signed)
I was able to connect with the patient.  He is agreeable this plan.

## 2019-06-22 ENCOUNTER — Encounter: Payer: Self-pay | Admitting: Oncology

## 2019-06-22 NOTE — Progress Notes (Signed)
Patient contacted for new patient visit. No concerns voiced. Patient given directions to cancer center.

## 2019-06-23 ENCOUNTER — Inpatient Hospital Stay: Payer: 59 | Attending: Oncology | Admitting: Oncology

## 2019-06-23 ENCOUNTER — Other Ambulatory Visit: Payer: Self-pay

## 2019-06-23 VITALS — BP 137/91 | HR 75 | Temp 97.2°F | Resp 18 | Wt 175.4 lb

## 2019-06-23 DIAGNOSIS — K635 Polyp of colon: Secondary | ICD-10-CM | POA: Diagnosis present

## 2019-06-23 DIAGNOSIS — R972 Elevated prostate specific antigen [PSA]: Secondary | ICD-10-CM

## 2019-06-23 NOTE — Progress Notes (Signed)
Hematology/Oncology Consult note [facility] Telephone:([phone] [phone]   Patient Care Team: Pleas Koch, NP as PCP - General (Internal Medicine)  REFERRING PROVIDER: [name], MD  CHIEF COMPLAINTS/REASON FOR VISIT:  Evaluation of colon polyps dysplasia  HISTORY OF PRESENTING ILLNESS:   Tony Hernandez is a  64 y.o.  male with PMH listed below was seen in consultation at the request of  [name], MD  for evaluation of colon polyp with dysplasia  Patient recently had colonoscopy done.  43mm rectum polyp -hyperplastic polyp. Negative for dysplasia and malignancy Colon polyps x 4, cecum and ascending, hot snare.  Tubular adenoma with multi-focal high grade dysplasia (4) Lower grade dysplasia present at cauterized polyp stalk in 3 of multiple sections, number of corresponding polyps is unclear.   Patient has no complaints today. He also sees urologist for elevated PSA, recent MRI showed 2 regions suspicious for prostate cancer. Plans for fusion biopsy.   Sister has history of colon cancer. .     Review of Systems  Constitutional: Negative for appetite change, chills, fatigue, fever and unexpected weight change.  HENT:   Negative for hearing loss and voice change.   Eyes: Negative for eye problems and icterus.  Respiratory: Negative for chest tightness, cough and shortness of breath.   Cardiovascular: Negative for chest pain and leg swelling.  Gastrointestinal: Negative for abdominal distention and abdominal pain.  Endocrine: Negative for hot flashes.  Genitourinary: Negative for difficulty urinating, dysuria and frequency.   Musculoskeletal: Negative for arthralgias.  Skin: Negative for itching and rash.  Neurological: Negative for light-headedness and numbness.  Hematological: Negative for adenopathy. Does not bruise/bleed easily.  Psychiatric/Behavioral: Negative for confusion.    MEDICAL HISTORY:  Past Medical History:    Diagnosis Date  . Arthritis    knees  . GERD (gastroesophageal reflux disease)   . Thyroid disease   . Wears dentures     SURGICAL HISTORY: Past Surgical History:  Procedure Laterality Date  . CHOLECYSTECTOMY  2018  . COLONOSCOPY WITH PROPOFOL N/A 05/19/2019   Procedure: COLONOSCOPY WITH PROPOFOL;  Surgeon: [name], MD;  Location: [place];  Service: Endoscopy;  Laterality: N/A;  Priority 4  . POLYPECTOMY  05/19/2019   Procedure: POLYPECTOMY;  Surgeon: [name], MD;  Location: [place];  Service: Endoscopy;;    SOCIAL HISTORY: Social History   Socioeconomic History  . Marital status: Single    Spouse name: Not on file  . Number of children: Not on file  . Years of education: Not on file  . Highest education level: Not on file  Occupational History  . Not on file  Tobacco Use  . Smoking status: Former Smoker    Quit date: 06/21/2001    Years since quitting: 18.0  . Smokeless tobacco: Never Used  Substance and Sexual Activity  . Alcohol use: No    Alcohol/week: 0.0 standard drinks    Comment: quit 18 years ago  . Drug use: Not Currently    Types: Cocaine, Marijuana    Comment: quit 18 years ago   . Sexual activity: Yes    Birth control/protection: None  Other Topics Concern  . Not on file  Social History Narrative   Single.   2 children.   Works as a Administrator.   Enjoys spending time with family.   Social Determinants of Health   Financial Resource Strain:   . Difficulty of Paying Living Expenses:   Food Insecurity:   .  Worried About Charity fundraiser in the Last Year:   . Arboriculturist in the Last Year:   Transportation Needs:   . Film/video editor (Medical):   Marland Kitchen Lack of Transportation (Non-Medical):   Physical Activity:   . Days of Exercise per Week:   . Minutes of Exercise per Session:   Stress:   . Feeling of Stress :   Social Connections:   . Frequency of Communication with Friends and Family:   . Frequency  of Social Gatherings with Friends and Family:   . Attends Religious Services:   . Active Member of Clubs or Organizations:   . Attends Archivist Meetings:   Marland Kitchen Marital Status:   Intimate Partner Violence:   . Fear of Current or Ex-Partner:   . Emotionally Abused:   Marland Kitchen Physically Abused:   . Sexually Abused:     FAMILY HISTORY: Family History  Problem Relation Age of Onset  . Lung cancer Mother   . Diabetes Paternal Grandmother   . Colon cancer Sister     ALLERGIES:  has No Known Allergies.  MEDICATIONS:  Current Outpatient Medications  Medication Sig Dispense Refill  . levothyroxine (SYNTHROID) 150 MCG tablet TAKE 1 TABLET BY MOUTH EVERY DAY 90 tablet 3   No current facility-administered medications for this visit.     PHYSICAL EXAMINATION: ECOG PERFORMANCE STATUS: 0 - Asymptomatic Vitals:   06/23/19 1128  BP: (!) 137/91  Pulse: 75  Resp: 18  Temp: (!) 97.2 F (36.2 C)   Filed Weights   06/23/19 1128  Weight: 175 lb 6.4 oz (79.6 kg)    Physical Exam Constitutional:      General: He is not in acute distress. HENT:     Head: Normocephalic and atraumatic.  Eyes:     General: No scleral icterus. Cardiovascular:     Rate and Rhythm: Normal rate and regular rhythm.     Heart sounds: Normal heart sounds.  Pulmonary:     Effort: Pulmonary effort is normal. No respiratory distress.     Breath sounds: No wheezing.  Abdominal:     General: Bowel sounds are normal. There is no distension.     Palpations: Abdomen is soft.  Musculoskeletal:        General: No deformity. Normal range of motion.     Cervical back: Normal range of motion and neck supple.  Skin:    General: Skin is warm and dry.     Findings: No erythema or rash.  Neurological:     Mental Status: He is alert and oriented to person, place, and time. Mental status is at baseline.     Cranial Nerves: No cranial nerve deficit.     Coordination: Coordination normal.  Psychiatric:        Mood  and Affect: Mood normal.     LABORATORY DATA:  I have reviewed the data as listed Lab Results  Component Value Date   WBC 9.1 04/07/2019   HGB 13.9 04/07/2019   HCT 42.8 04/07/2019   MCV 76.3 (L) 04/07/2019   PLT 389 04/07/2019   Recent Labs    04/07/19 1511 06/09/19 1000  NA 140  --   K 4.8  --   CL 106  --   CO2 26  --   GLUCOSE 75  --   BUN 12  --   CREATININE 1.10 0.90  CALCIUM 9.9  --   PROT 6.9  --   AST 13  --  ALT 16  --   BILITOT 0.3  --    Iron/TIBC/Ferritin/ %Sat No results found for: IRON, TIBC, FERRITIN, IRONPCTSAT    RADIOGRAPHIC STUDIES: I have personally reviewed the radiological images as listed and agreed with the findings in the report. MR PROSTATE W WO CONTRAST  Addendum Date: 06/09/2019   ADDENDUM REPORT: 06/09/2019 16:02 ADDENDUM: By report after discussing the case with the provider, the patient has not had prior biopsy. Areas in the prostate may represent slightly atypical appearance of sequela of prostatitis. Though given the increased T1 signal hemorrhagic or proteinaceous changes in these areas are considered. An area not previously marked in the right hemi prostate particularly on sagittal view is slightly more mass-like than areas on the left particularly when viewed in the sagittal plane. PIRADS category 3 (image 6 of series 7) this area measures approximately 8 x 8 mm, does show restricted diffusion but on some sequences is wedge-shaped. This area will be marked as region 3, areas in the axial plane which display less wedge-shaped morphology are marked. The area marked as region 1 on the prior imaging study is downgraded to PIRADS category 3, rounded nature of the area on the ADC map is of moderate suspicion but it is also seen within areas of increased T1 signal and wedge-shaped and linear changes. The area previously marked as region 2 is downgraded to PIRADS category 2 with wedge-shaped appearance favored to represent sequela of prostatitis.  Diffuse stromal rich BPH changes are favored in the transitional zone. Electronically Signed   By: Zetta Bills M.D.   On: 06/09/2019 16:02   Result Date: 06/09/2019 CLINICAL DATA:  Elevated PSA as high as 12 in 2019 with most recent PSA of 7.5. EXAM: MR PROSTATE WITHOUT AND WITH CONTRAST TECHNIQUE: Multiplanar multisequence MRI images were obtained of the pelvis centered about the prostate. Pre and post contrast images were obtained. CONTRAST:  7.63mL GADAVIST GADOBUTROL 1 MMOL/ML IV SOLN COMPARISON:  None FINDINGS: Prostate: Evidence of BPH. Wedge-shaped areas in the peripheral zone with increased T1 signal suggestive of hemorrhage from prior biopsy. Peripheral zone: Areas of added T1 signal as described. These areas reflect foci of hemorrhage presumably post biopsy. Area of more rounded abnormality on the ADC (image 17, series 9) also displaying restricted diffusion on high B value images but contiguous with linear area in the gland felt to be related to hemorrhage. PIRADS category 4 based on potential exclusion. Region 1 10 mm area (image 20, series 9) in the left lateral apex of the prostate at approximately the 3 to 5 o'clock position also displaying low signal on ADC and high signal on calculated high B value images. Not definitely corresponding to an area of hemorrhage. PIRADS category 4 region 2 Transitional zone: Diffuse low signal in the transitional zone with circumscribed margins presumed BPH but without the discrete smaller areas of nodular change that is expected. Left and right gland with more macro nodular appearance and scattered areas of increased T2 signal. These may represent large stroma rich BPH nodules displaying uniform diffusion related signal and evidence of restricted diffusion on high B value diffusion. Volume: 63.4 cc Transcapsular spread:  Present/absent/other Seminal vesicle involvement: present/absent/other Neurovascular bundle involvement: present/absent/other Pelvic adenopathy:  Absent Bone metastasis: Absent Other findings: None IMPRESSION: Two regions that are suspicious for prostate cancer at PIRADS category 4 designation. Imaging confounded by the presence of presumed post biopsy hemorrhage within the peripheral zone. Changes of BPH with very low signal throughout the central gland  with intermixed areas of high T2 signal such as would be expected in the setting of large BPH nodules perhaps diffuse stromal rich changes of BPH. Diffuse neoplasm is felt to be unlikely though if biopsy has not been performed as is suggested based on changes in the peripheral zone areas throughout the transitional zone could be sampled to exclude this possibility. No signs of extracapsular extension or neurovascular bundle involvement. Electronically Signed: By: Zetta Bills M.D. On: 06/09/2019 13:22      ASSESSMENT & PLAN:  1. Polyp of colon, unspecified part of colon, unspecified type   2. Elevated PSA    Colon Polyps  -Tubular adenoma with high grade dysplasia x 4  - Low grade dysplasia found at cauterized polyp stalk- 3 sections.   I will present his case on tumor board for discussion.   I discussed with him regarding surgical evaluation.   - elevated PSA, 04/07/2019 at 7.5  follow up with Urology.  Pending biopsy.    All questions were answered. The patient knows to call the clinic with any problems questions or concerns.   Return of visit: TBD Thank you for this kind referral and the opportunity to participate in the care of this patient. A copy of today's note is routed to referring provider    Earlie Server, MD, PhD Hematology Oncology [facility] Pager- [phone] 06/23/2019

## 2019-06-24 ENCOUNTER — Ambulatory Visit: Payer: 59 | Attending: Internal Medicine

## 2019-06-24 DIAGNOSIS — Z23 Encounter for immunization: Secondary | ICD-10-CM

## 2019-06-24 NOTE — Progress Notes (Signed)
   Covid-19 Vaccination Clinic  Name:  Tony Hernandez    MRN: [id number] DOB: 1956/02/16  06/24/2019  [name] was observed post Covid-19 immunization for 15 minutes without incident. He was provided with Vaccine Information Sheet and instruction to access the V-Safe system.   [name] was instructed to call 911 with any severe reactions post vaccine: Marland Kitchen Difficulty breathing  . Swelling of face and throat  . A fast heartbeat  . A bad rash all over body  . Dizziness and weakness   Immunizations Administered    Name Date Dose VIS Date Route   Pfizer COVID-19 Vaccine 06/24/2019 12:01 PM 0.3 mL 03/03/2019 Intramuscular   Manufacturer: [place]   Lot: [id number]   [place]: [id number]

## 2019-06-29 ENCOUNTER — Other Ambulatory Visit: Payer: 59

## 2019-06-29 ENCOUNTER — Other Ambulatory Visit: Payer: Self-pay

## 2019-06-29 ENCOUNTER — Telehealth: Payer: Self-pay | Admitting: Oncology

## 2019-06-29 DIAGNOSIS — K635 Polyp of colon: Secondary | ICD-10-CM

## 2019-06-29 NOTE — Telephone Encounter (Signed)
I called patient and discussed with him about tumor board consensus.  This case was discussed on 06/29/2019 multidisciplinary tumor board and consensus reached for surgical evaluation given multifocal high-grade dysplastic.  He voices understanding.  He also is undergoing evaluation for abnormal MRI prostate findings. Will refer to Dr. Dahlia Byes for surgical evaluation

## 2019-06-29 NOTE — Progress Notes (Signed)
Tumor Board Documentation  Tony Hernandez was presented by Dr Tasia Catchings at our Tumor Board on 06/29/2019, which included representatives from medical oncology, radiation oncology, navigation, pathology, radiology, surgical, surgical oncology, internal medicine, genetics, research.  Tony Hernandez currently presents as a new patient, for discussion with history of the following treatments: active survellience.  Additionally, we reviewed previous medical and familial history, history of present illness, and recent lab results along with all available histopathologic and imaging studies. The tumor board considered available treatment options and made the following recommendations: Surgery    The following procedures/referrals were also placed: No orders of the defined types were placed in this encounter.   Clinical Trial Status:     Staging used: Not Applicable  National site-specific guidelines   were discussed with respect to the case.  Tumor board is a meeting of clinicians from various specialty areas who evaluate and discuss patients for whom a multidisciplinary approach is being considered. Final determinations in the plan of care are those of the provider(s). The responsibility for follow up of recommendations given during tumor board is that of the provider.   Today's extended care, comprehensive team conference, Tony Hernandez was not present for the discussion and was not examined.   Multidisciplinary Tumor Board is a multidisciplinary case peer review process.  Decisions discussed in the Multidisciplinary Tumor Board reflect the opinions of the specialists present at the conference without having examined the patient.  Ultimately, treatment and diagnostic decisions rest with the primary provider(s) and the patient.

## 2019-07-05 ENCOUNTER — Ambulatory Visit: Payer: Self-pay | Admitting: Surgery

## 2019-07-10 ENCOUNTER — Encounter: Payer: Self-pay | Admitting: Surgery

## 2019-07-10 ENCOUNTER — Ambulatory Visit (INDEPENDENT_AMBULATORY_CARE_PROVIDER_SITE_OTHER): Payer: 59 | Admitting: Surgery

## 2019-07-10 ENCOUNTER — Other Ambulatory Visit: Payer: Self-pay

## 2019-07-10 VITALS — BP 150/90 | HR 90 | Temp 98.3°F | Resp 14 | Ht 65.0 in | Wt 173.0 lb

## 2019-07-10 DIAGNOSIS — D122 Benign neoplasm of ascending colon: Secondary | ICD-10-CM

## 2019-07-10 DIAGNOSIS — D126 Benign neoplasm of colon, unspecified: Secondary | ICD-10-CM

## 2019-07-10 MED ORDER — ERYTHROMYCIN BASE 500 MG PO TABS: ORAL_TABLET | ORAL | 0 refills | Status: DC

## 2019-07-10 MED ORDER — BISACODYL 5 MG PO TBEC: DELAYED_RELEASE_TABLET | ORAL | 0 refills | Status: DC

## 2019-07-10 MED ORDER — NEOMYCIN SULFATE 500 MG PO TABS: ORAL_TABLET | ORAL | 0 refills | Status: DC

## 2019-07-10 MED ORDER — POLYETHYLENE GLYCOL 3350 17 GM/SCOOP PO POWD: ORAL | 0 refills | Status: DC

## 2019-07-10 NOTE — Patient Instructions (Addendum)
-We have ordered lab work for you. This can be done anytime at [facility]. Go in through the [place] entrance to have this done.   -You have been scheduled for a CT of the abdomen/pelvis with contrast at [place] on 07/31/19 at 8:30 am. Please arrive there by 8:15 am. Prep: Nothing to eat or drink 4 hours prior. You will need to pick up a prep kit.   We have discussed removing a portion of your colon through 4 small incisions today. We have scheduled this surgery at [facility] with Dr. Dahlia Byes. Please plan a hospital stay of 3-7 days for surgery and recovery time.  Our surgery scheduler will call you to look at surgery dates and go over surgery information.  We will have you complete a bowel prep prior to your surgery. Please see information provided. You have also been given a (Blue) Pre-Care Sheet with more information regarding your particular surgery. Please review all information given.  After surgery you will have lifting restrictions in place for at least 4 weeks.   Please call our office with any questions or concerns prior to your scheduled surgery.   Laparoscopic Colectomy Laparoscopic colectomy is surgery to remove part or all of the large intestine (colon). This procedure may be used to treat several conditions, including:  Inflammation and infection of the colon (diverticulitis).  Tumors or masses in the colon.  Inflammatory bowel disease, such as Crohn disease or ulcerative colitis. Colectomy is an option when symptoms cannot be controlled with medicines.  Bleeding from the colon that cannot be controlled by another method.  Blockage or obstruction of the colon.  Tell a health care provider about:  Any allergies you have.  All medicines you are taking, including vitamins, herbs, eye drops, creams, and over-the-counter medicines.  Any problems you or family members have had with anesthetic medicines.  Any blood disorders you have.  Any surgeries you have  had.  Any medical conditions you have. What are the risks? Generally, this is a safe procedure. However, problems may occur, including:  Infection.  Bleeding.  Allergic reactions to medicines or dyes.  Damage to other structures or organs.  Leaking from where the colon was sewn together.  Future blockage of the small intestines from scar tissue. Another surgery may be needed to repair this.  Needing to convert to an open procedure. Complications such as damage to other organs or excessive bleeding may require the surgeon to convert from a laparoscopic procedure to an open procedure. This involves making a larger incision in the abdomen.  What happens before the procedure? Staying hydrated Follow instructions from your health care provider about hydration, which may include:  Up to 2 hours before the procedure - you may continue to drink clear liquids, such as water, clear fruit juice, black coffee, and plain tea.  Medicines  Ask your health care provider about: ? Changing or stopping your regular medicines. This is especially important if you are taking diabetes medicines or blood thinners. ? Taking medicines such as aspirin and ibuprofen. These medicines can thin your blood. Do not take these medicines before your procedure if your health care provider instructs you not to.  You may be given antibiotic medicine to clean out bacteria from your colon. Follow the directions carefully and take the medicine at the correct time. General instructions  You may be prescribed an oral bowel prep to clean out your colon in preparation for the surgery: ? Follow instructions from your health  care provider about how to do this. ? Do not eat or drink anything else after you have started the bowel prep, unless your health care provider tells you it is safe to do so.  Do not use any products that contain nicotine or tobacco, such as cigarettes and e-cigarettes. If you need help quitting, ask  your health care provider. What happens during the procedure?  To reduce your risk of infection: ? Your health care team will wash or sanitize their hands. ? Your skin will be washed with soap.  An IV tube will be inserted into one of your veins to deliver fluid and medication.  You will be given one of the following: ? A medicine to help you relax (sedative). ? A medicine to make you fall asleep (general anesthetic).  Small monitors will be connected to your body. They will be used to check your heart, blood pressure, and oxygen level.  A breathing tube may be placed into your lungs during the procedure.  A thin, flexible tube (catheter) will be placed into your bladder to drain urine.  A tube may be placed through your nose and into your stomach to drain stomach fluids (nasogastric tube, or NG tube).  Your abdomen will be filled with air so it expands. This gives the surgeon more room to operate and makes your organs easier to see.  Several small cuts (incisions) will be made in your abdomen.  A thin, lighted tube with a tiny camera on the end (laparoscope) will be put through one of the small incisions. The camera on the laparoscope will send a picture to a computer screen in the operating room. This will give the surgeon a good view inside your abdomen.  Hollow tubes will be put through the other small incisions in your abdomen. The tools that are needed for the procedure will be put through these tubes.  Clamps or staples will be put on both ends of the diseased part of the colon.  The part of the intestine between the clamps or staples will be removed.  If possible, the ends of the healthy colon that remain will be stitched (sutured) or stapled together to allow your body to pass waste (stool).  Sometimes, the remaining colon cannot be stitched back together. If this is the case, a colostomy will be needed. If you need a colostomy: ? An opening to the outside of your body  (stoma) will be made through your abdomen. ? The end of your colon will be brought to the opening. It will be stitched to the skin. ? A bag will be attached to the opening. Stool will drain into this removable bag. ? The colostomy may be temporary or permanent.  The incisions from the colectomy will be closed with sutures or staples. The procedure may vary among health care providers and hospitals. What happens after the procedure?  Your blood pressure, heart rate, breathing rate, and blood oxygen level will be monitored until the medicines you were given have worn off.  You will receive fluids through an IV tube until your bowels start to work properly.  Once your bowels are working again, you will be given clear liquids first and then solid food as tolerated.  You will be given medicines to control your pain and nausea, if needed.  Do not drive for 24 hours if you were given a sedative. This information is not intended to replace advice given to you by your health care provider. Make sure you  discuss any questions you have with your health care provider. Document Released: 05/30/2002 Document Revised: 12/09/2015 Document Reviewed: 12/09/2015 Elsevier Interactive Patient Education  2018 [employer].     Laparoscopic Colectomy, Care After This sheet gives you information about how to care for yourself after your procedure. Your health care provider may also give you more specific instructions. If you have problems or questions, contact your health care provider. What can I expect after the procedure? After your procedure, it is common to have the following:  Pain in your abdomen, especially in the incision areas. You will be given medicine to control the pain.  Tiredness. This is a normal part of the recovery process. Your energy level will return to normal over the next several weeks.  Changes in your bowel movements, such as constipation or needing to go more often. Talk with  your health care provider about how to manage this.  Follow these instructions at home: Medicines  Take over-the-counter and prescription medicines only as told by your health care provider.  Do not drive or use heavy machinery while taking prescription pain medicine.  Do not drink alcohol while taking prescription pain medicine.  If you were prescribed an antibiotic medicine, use it as told by your health care provider. Do not stop using the antibiotic even if you start to feel better. Incision care  Follow instructions from your health care provider about how to take care of your incision areas. Make sure you: ? Keep your incisions clean and dry. ? Wash your hands with soap and water before and after applying medicine to the areas, and before and after changing your bandage (dressing). If soap and water are not available, use hand sanitizer. ? Change your dressing as told by your health care provider. ? Leave stitches (sutures), skin glue, or adhesive strips in place. These skin closures may need to stay in place for 2 weeks or longer. If adhesive strip edges start to loosen and curl up, you may trim the loose edges. Do not remove adhesive strips completely unless your health care provider tells you to do that.  Do not wear tight clothing over the incisions. Tight clothing may rub and irritate the incision areas, which may cause the incisions to open.  Do not take baths, swim, or use a hot tub until your health care provider approves. Ask your health care provider if you can take showers. You may only be allowed to take sponge baths for bathing.  Check your incision area every day for signs of infection. Check for: ? More redness, swelling, or pain. ? More fluid or blood. ? Warmth. ? Pus or a bad smell. Activity  Avoid lifting anything that is heavier than 10 lb (4.5 kg) for 2 weeks or until your health care provider says it is okay.  You may resume normal activities as told by  your health care provider. Ask your health care provider what activities are safe for you.  Take rest breaks during the day as needed. Eating and drinking  Follow instructions from your health care provider about what you can eat after surgery.  To prevent or treat constipation while you are taking prescription pain medicine, your health care provider may recommend that you: ? Drink enough fluid to keep your urine clear or pale yellow. ? Take over-the-counter or prescription medicines. ? Eat foods that are high in fiber, such as fresh fruits and vegetables, whole grains, and beans. ? Limit foods that are high in fat  and processed sugars, such as fried and sweet foods. General instructions  Ask your health care provider when you will need an appointment to get your sutures or staples removed.  Keep all follow-up visits as told by your health care provider. This is important. Contact a health care provider if:  You have more redness, swelling, or pain around your incisions.  You have more fluid or blood coming from the incisions.  Your incisions feel warm to the touch.  You have pus or a bad smell coming from your incisions or your dressing.  You have a fever.  You have an incision that breaks open (edges not staying together) after sutures or staples have been removed. Get help right away if:  You develop a rash.  You have chest pain or difficulty breathing.  You have pain or swelling in your legs.  You feel light-headed or you faint.  Your abdomen swells (becomes distended).  You have nausea or vomiting.  You have blood in your stool (feces). This information is not intended to replace advice given to you by your health care provider. Make sure you discuss any questions you have with your health care provider. Document Released: 09/26/2004 Document Revised: 12/09/2015 Document Reviewed: 12/09/2015 Elsevier Interactive Patient Education  [employer].

## 2019-07-10 NOTE — Progress Notes (Signed)
Surgical Consultation  07/10/2019  Tony Hernandez is an 64 y.o. male.   Chief Complaint  Patient presents with  . New Patient (Initial Visit)    Colon polyp     HPI: 64 year old male seen in consultation at the request of Dr. Vicente Males.  He recently underwent a screening colonoscopy and was found a large cecal polyp 20 mm in the cecum it was actually elevated and resected with a snare technique.  He had a few other polyps in the right colon.  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  Pathologist states that the number of corresponding polyps is unclear.  There was an additional rectal hyperplastic polyp He does have significant family history for colorectal cancer,  sister.  He denies any hematochezia, no melena or weight loss.  He is able to perform more than 4 METS of activity without any shortness of breath or chest pain.  Only medications he takes is Synthroid and antihypertensives.   He had a previous laparoscopic cholecystectomy that was uneventful. I have personally reviewed the colonoscopy images.  CBC and CMP from 3 months ago were completely normal. He underwent recent MRI of the prostate and he will require fusion biopsy  Past Medical History:  Diagnosis Date  . Arthritis    knees  . GERD (gastroesophageal reflux disease)   . Thyroid disease   . Wears dentures     Past Surgical History:  Procedure Laterality Date  . CHOLECYSTECTOMY  2018  . COLONOSCOPY WITH PROPOFOL N/A 05/19/2019   Procedure: COLONOSCOPY WITH PROPOFOL;  Surgeon: [name], MD;  Location: [place];  Service: Endoscopy;  Laterality: N/A;  Priority 4  . POLYPECTOMY  05/19/2019   Procedure: POLYPECTOMY;  Surgeon: [name], MD;  Location: [place];  Service: Endoscopy;;    Family History  Problem Relation Age of Onset  . Lung cancer Mother 10  . Diabetes Paternal Grandmother    . Colon cancer Sister 25    Social History:  reports that he quit smoking about 18 years ago. His smoking use included cigarettes. He quit after 10.00 years of use. He has never used smokeless tobacco. He reports previous drug use. Drugs: Cocaine and Marijuana. He reports that he does not drink alcohol.  Allergies: No Known Allergies  Medications reviewed.     ROS Full ROS performed and is otherwise negative other than what is stated in the HPI    BP (!) 150/90   Pulse 90   Temp 98.3 F (36.8 C)   Resp 14   Ht 5\' 5"  (1.651 m)   Wt 173 lb (78.5 kg)   SpO2 97%   BMI 28.79 kg/m   Physical Exam Vitals and nursing note reviewed. Exam conducted with a chaperone present.  Constitutional:      General: He is not in acute distress.    Appearance: Normal appearance. He is not toxic-appearing.  Eyes:     General: No scleral icterus.       Right eye: No discharge.        Left eye: No discharge.  Cardiovascular:     Rate and Rhythm: Normal rate and regular rhythm.     Heart sounds: No murmur.  Pulmonary:     Effort: Pulmonary effort is normal. No respiratory distress.     Breath sounds: Normal breath sounds. No stridor. No rhonchi.  Abdominal:     General:  Abdomen is flat. There is no distension.     Palpations: There is no mass.     Tenderness: There is no abdominal tenderness. There is no guarding or rebound.     Hernia: No hernia is present.  Musculoskeletal:        General: Normal range of motion.     Cervical back: Normal range of motion and neck supple. No rigidity or tenderness.  Lymphadenopathy:     Cervical: No cervical adenopathy.  Skin:    General: Skin is warm and dry.     Capillary Refill: Capillary refill takes less than 2 seconds.  Neurological:     General: No focal deficit present.     Mental Status: He is alert and oriented to person, place, and time.  Psychiatric:        Mood and Affect: Mood normal.        Behavior: Behavior normal.         Thought Content: Thought content normal.        Judgment: Judgment normal.       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  Due to unknown margins and size, in addition to high-grade dysplasia there is a significant risk for residual carcinoma.  I do recommend completion right hemicolectomy for that reason.  I do think that he is a good candidate for robotic approach.  Procedure discussed with the patient in detail.  Risks, benefits and possible complications including but not limited to: Bleeding, infection, anastomotic leak, possible reintervention, chronic pain and hernias. I have also discussed with him postoperative course.  Given the high-grade dysplasia I will obtain a CT scan of the abdomen and pelvis to make sure there is no distant metastatic disease and I will obtain a baseline CEA as well as a CBC and a CMP. We will tentatively schedule him for laparoscopic assisted robotic right hemicolectomy in the next few weeks at his convenience.  A copy of the report was sent to the referring provider. I will contact Dr. Erlene Quan to make her aware of my assessment. I do not suspect that this will change any step in management as he needs a right colectomy.  Caroleen Hamman, MD [facility] General Surgeon

## 2019-07-11 ENCOUNTER — Encounter: Payer: Self-pay | Admitting: Surgery

## 2019-07-12 ENCOUNTER — Telehealth: Payer: Self-pay | Admitting: Surgery

## 2019-07-12 NOTE — Telephone Encounter (Signed)
Pt has been advised of Pre-Admission date/time, COVID Testing date and Surgery date.  Surgery Date: 08/17/19 Preadmission Testing Date: 08/10/19 (phone 8a-1p) Covid Testing Date: 08/15/19 - patient advised to go to the [place] [facility] between 8a-1p   Patient has been made aware to call [phone], between 1-3:00pm the day before surgery, to find out what time to arrive for surgery.

## 2019-07-24 ENCOUNTER — Ambulatory Visit: Payer: 59 | Attending: Internal Medicine

## 2019-07-24 DIAGNOSIS — Z23 Encounter for immunization: Secondary | ICD-10-CM

## 2019-07-24 NOTE — Progress Notes (Signed)
   Covid-19 Vaccination Clinic  Name:  Tony Hernandez    MRN: [id number] DOB: 04/04/1955  07/24/2019  [name] was observed post Covid-19 immunization for 15 minutes without incident. He was provided with Vaccine Information Sheet and instruction to access the V-Safe system.   [name] was instructed to call 911 with any severe reactions post vaccine: Marland Kitchen Difficulty breathing  . Swelling of face and throat  . A fast heartbeat  . A bad rash all over body  . Dizziness and weakness   Immunizations Administered    Name Date Dose VIS Date Route   Pfizer COVID-19 Vaccine 07/24/2019  2:54 PM 0.3 mL 05/17/2018 Intramuscular   Manufacturer: [place]   Lot: [id number]   [place]: [id number]

## 2019-07-31 ENCOUNTER — Other Ambulatory Visit: Payer: Self-pay

## 2019-07-31 ENCOUNTER — Ambulatory Visit
Admission: RE | Admit: 2019-07-31 | Discharge: 2019-07-31 | Disposition: A | Discharge status: Home / Self Care | Payer: 59 | Source: Ambulatory Visit | Attending: Surgery | Admitting: Surgery

## 2019-07-31 DIAGNOSIS — D126 Benign neoplasm of colon, unspecified: Secondary | ICD-10-CM | POA: Diagnosis present

## 2019-07-31 LAB — POCT I-STAT CREATININE: Creatinine, Ser: 1.1 mg/dL (ref 0.61–1.24)

## 2019-07-31 MED ORDER — IOHEXOL 300 MG/ML  SOLN
100.0000 mL | Freq: Once | INTRAMUSCULAR | Status: AC | PRN
  Administered 2019-07-31: 09:00:00 100 mL via INTRAVENOUS

## 2019-08-01 ENCOUNTER — Telehealth: Payer: Self-pay | Admitting: Urology

## 2019-08-01 ENCOUNTER — Other Ambulatory Visit: Payer: Self-pay | Admitting: Urology

## 2019-08-07 NOTE — H&P (View-Only) (Signed)
08/08/19 3:50 PM   Stephannie Li Flow July 12, 1955 [id number]  Referring provider: Pleas Koch, NP [place],  [place] [address] Chief Complaint  Patient presents with  . Results    HPI: Tony Hernandez is a 64 y.o. M who returns today to discuss newly dx prostate cancer.    He has a personal history of elevated/fluctuating PSA as high as 12 in 2019. Most recent PSA 7.5 on 04/07/2019 up from the previous year at which time it was 6.55 in 05/2018. PSA trend below.   Personally discussed MRI with Dr. Jacalyn Lefevre regarding MRI finding. Most recent MRI indicated two regions suspicious for prostate cancer at [facility] category 4 designation. No enlarged lymph nodes or metastasis outside of prostate noted. Nodular BPH enlarged. Obscured by post biopsy hemorrhage however no biopsy performed. MR size 64 g.   He had a fusion prostate bx on 07/27/19. His path report indicates 6 of 12 cores involved low volume Gleason 3+3, 4+3, 3+4 up to 10% of tissue bilaterally at apex lateral, apex, and mid. He has low risk prostate cancer on left side and intermediate risk unfavorable prostate cancer on right side.  Prostate volume 47 g.   He is scheduled for a robot assisted laparoscopic right colectomy on 08/17/19 performed by Dr. Dahlia Byes.   No history of dysuria or pelvic pain. No history of UTIs.  No weight loss or bone pain.  No family history of prostate cancer.  PSA Trend:  12, 2019  6.55, 05/2018 7.5 04/07/19  IPSS    Row Name 08/08/19 1100         International Prostate Symptom Score   How often have you had the sensation of not emptying your bladder?  Not at All     How often have you had to urinate less than every two hours?  Less than 1 in 5 times     How often have you found you stopped and started again several times when you urinated?  Less than 1 in 5 times     How often have you found it difficult to postpone urination?  Not at All     How often have you had a weak urinary stream?   Not at All     How often have you had to strain to start urination?  Not at All     How many times did you typically get up at night to urinate?  2 Times     Total IPSS Score  4       Quality of Life due to urinary symptoms   If you were to spend the rest of your life with your urinary condition just the way it is now how would you feel about that?  Mixed        Score:  1-7 Mild 8-19 Moderate 20-35 Severe  SHIM    Row Name 08/08/19 1136         SHIM: Over the last 6 months:   How do you rate your confidence that you could get and keep an erection?  Moderate     When you had erections with sexual stimulation, how often were your erections hard enough for penetration (entering your partner)?  Most Times (much more than half the time)     During sexual intercourse, how often were you able to maintain your erection after you had penetrated (entered) your partner?  Almost Always or Always     During sexual intercourse,  how difficult was it to maintain your erection to completion of intercourse?  Not Difficult     When you attempted sexual intercourse, how often was it satisfactory for you?  Almost Always or Always       SHIM Total Score   SHIM  22         PMH: Past Medical History:  Diagnosis Date  . Arthritis    knees  . GERD (gastroesophageal reflux disease)   . Thyroid disease   . Wears dentures     Surgical History: Past Surgical History:  Procedure Laterality Date  . CHOLECYSTECTOMY  2018  . COLONOSCOPY WITH PROPOFOL N/A 05/19/2019   Procedure: COLONOSCOPY WITH PROPOFOL;  Surgeon: [name], MD;  Location: [place];  Service: Endoscopy;  Laterality: N/A;  Priority 4  . POLYPECTOMY  05/19/2019   Procedure: POLYPECTOMY;  Surgeon: [name], MD;  Location: [place];  Service: Endoscopy;;    Home Medications:  Allergies as of 08/08/2019   No Known Allergies     Medication List       Accurate as of Aug 08, 2019  3:50 PM. If you have any  questions, ask your nurse or doctor.        STOP taking these medications   bisacodyl 5 MG EC tablet Commonly known as: DULCOLAX Stopped by: Hollice Espy, MD   erythromycin base 500 MG tablet Commonly known as: E-MYCIN Stopped by: Hollice Espy, MD     TAKE these medications   levothyroxine 150 MCG tablet Commonly known as: SYNTHROID TAKE 1 TABLET BY MOUTH EVERY DAY   naproxen sodium 220 MG tablet Commonly known as: ALEVE Take 440 mg by mouth 2 (two) times daily as needed (pain.).   neomycin 500 MG tablet Commonly known as: MYCIFRADIN Take 2 tablet at 8am, take 2 tablets at 2pm, and take 2 tablets at 8pm the day prior to your surgery   polyethylene glycol powder 17 GM/SCOOP powder Commonly known as: MiraLax Mix full container in 64 ounces of Gatorade or other clear liquid. No Red       Allergies: No Known Allergies  Family History: Family History  Problem Relation Age of Onset  . Lung cancer Mother 75  . Diabetes Paternal Grandmother   . Colon cancer Sister 30    Social History:  reports that he quit smoking about 18 years ago. His smoking use included cigarettes. He quit after 10.00 years of use. He has never used smokeless tobacco. He reports previous drug use. Drugs: Cocaine and Marijuana. He reports that he does not drink alcohol.   Physical Exam: BP 134/89   Pulse 89   Constitutional:  Alert and oriented, No acute distress. HEENT: [place] AT, moist mucus membranes.  Trachea midline, no masses. Cardiovascular: No clubbing, cyanosis, or edema. Respiratory: Normal respiratory effort, no increased work of breathing. Skin: No rashes, bruises or suspicious lesions. Neurologic: Grossly intact, no focal deficits, moving all 4 extremities. Psychiatric: Normal mood and affect.  Laboratory Data:  Lab Results  Component Value Date   CREATININE 1.10 07/31/2019    Lab Results  Component Value Date   PSA 7.5 (H) 04/07/2019   PSA 6.55 (H) 06/09/2018    Lab  Results  Component Value Date   HGBA1C 6.1 (H) 04/07/2019   Assessment & Plan:    1. Intermediate risk prostate cancer, unfavorable  The patient was counseled about the natural history of prostate cancer and the standard treatment options that are available for prostate  cancer. It was explained to him how his age and life expectancy, clinical stage, Gleason score, and PSA affect his prognosis, the decision to proceed with additional staging studies, as well as how that information influences recommended treatment strategies. We discussed the roles for active surveillance, radiation therapy, surgical therapy, androgen deprivation, as well as ablative therapy options for the treatment of prostate cancer as appropriate to his individual cancer situation. We discussed the risks and benefits of these options with regard to their impact on cancer control and also in terms of potential adverse events, complications, and impact on quality of life particularly related to urinary, bowel, and sexual function. The patient was encouraged to ask questions throughout the discussion today and all questions were answered to his stated satisfaction. In addition, the patient was provided with and/or directed to appropriate resources and literature for further education about prostate cancer treatment options.  We discussed surgical therapy for prostate cancer including the different available surgical approaches.  Specifically, we discussed robotic prostatectomy with pelvic lymph node dissection based on his restratification.  We discussed, in detail, the risks and expectations of surgery with regard to cancer control, urinary control, and erectile dysfunction as well as expected post operative recovery processed. Additional risks of surgery including but not limited to bleeding, infection, hernia formation, nerve damage, fistula formation, bowel/rectal injury, potentially necessitating colostomy, damage to the urinary tract  resulting in urinary leakage, urethral stricture, and cardiopulmonary risk such as myocardial infarction, stroke, death, thromboembolism etc. were explained.   He is hesitant to have a prostatectomy since he does not want to wear pull-ups w/ concerns for work. He is leaning towards doing surgery in conjunction w/ partial colectomy. He will talk to his fiance and will call back w/ decision. Book given on prostate cancer.   Case discussed extensively with Dr. Dahlia Byes today and he is in agreement to do a combined procedure.    Offered referral to radiation oncology, declined.  Urgent referral to physical therapy, start Kegel exercises ASAP.  Pre-op UA/culture today    [facility] [address], [place], [place] [address] [phone]  I, Lucas Mallow, am acting as a scribe for Dr. Hollice Espy,  I have reviewed the above documentation for accuracy and completeness, and I agree with the above.   Hollice Espy, MD  I spent 55 total minutes on the day of the encounter including pre-visit review of the medical record, face-to-face time with the patient, and post visit ordering of labs/imaging/tests.

## 2019-08-07 NOTE — Progress Notes (Signed)
08/08/19 3:50 PM   Tony Hernandez 13-Aug-1955 [id number]  Referring provider: Pleas Koch, NP [place],  [place] [address] Chief Complaint  Patient presents with  . Results    HPI: Tony Hernandez is a 64 y.o. M who returns today to discuss newly dx prostate cancer.    He has a personal history of elevated/fluctuating PSA as high as 12 in 2019. Most recent PSA 7.5 on 04/07/2019 up from the previous year at which time it was 6.55 in 05/2018. PSA trend below.   Personally discussed MRI with Dr. Jacalyn Lefevre regarding MRI finding. Most recent MRI indicated two regions suspicious for prostate cancer at [facility] category 4 designation. No enlarged lymph nodes or metastasis outside of prostate noted. Nodular BPH enlarged. Obscured by post biopsy hemorrhage however no biopsy performed. MR size 64 g.   He had a fusion prostate bx on 07/27/19. His path report indicates 6 of 12 cores involved low volume Gleason 3+3, 4+3, 3+4 up to 10% of tissue bilaterally at apex lateral, apex, and mid. He has low risk prostate cancer on left side and intermediate risk unfavorable prostate cancer on right side.  Prostate volume 47 g.   He is scheduled for a robot assisted laparoscopic right colectomy on 08/17/19 performed by Dr. Dahlia Byes.   No history of dysuria or pelvic pain. No history of UTIs.  No weight loss or bone pain.  No family history of prostate cancer.  PSA Trend:  12, 2019  6.55, 05/2018 7.5 04/07/19  IPSS    Row Name 08/08/19 1100         International Prostate Symptom Score   How often have you had the sensation of not emptying your bladder?  Not at All     How often have you had to urinate less than every two hours?  Less than 1 in 5 times     How often have you found you stopped and started again several times when you urinated?  Less than 1 in 5 times     How often have you found it difficult to postpone urination?  Not at All     How often have you had a weak urinary stream?   Not at All     How often have you had to strain to start urination?  Not at All     How many times did you typically get up at night to urinate?  2 Times     Total IPSS Score  4       Quality of Life due to urinary symptoms   If you were to spend the rest of your life with your urinary condition just the way it is now how would you feel about that?  Mixed        Score:  1-7 Mild 8-19 Moderate 20-35 Severe  SHIM    Row Name 08/08/19 1136         SHIM: Over the last 6 months:   How do you rate your confidence that you could get and keep an erection?  Moderate     When you had erections with sexual stimulation, how often were your erections hard enough for penetration (entering your partner)?  Most Times (much more than half the time)     During sexual intercourse, how often were you able to maintain your erection after you had penetrated (entered) your partner?  Almost Always or Always     During sexual intercourse,  how difficult was it to maintain your erection to completion of intercourse?  Not Difficult     When you attempted sexual intercourse, how often was it satisfactory for you?  Almost Always or Always       SHIM Total Score   SHIM  22         PMH: Past Medical History:  Diagnosis Date  . Arthritis    knees  . GERD (gastroesophageal reflux disease)   . Thyroid disease   . Wears dentures     Surgical History: Past Surgical History:  Procedure Laterality Date  . CHOLECYSTECTOMY  2018  . COLONOSCOPY WITH PROPOFOL N/A 05/19/2019   Procedure: COLONOSCOPY WITH PROPOFOL;  Surgeon: [name], MD;  Location: [place];  Service: Endoscopy;  Laterality: N/A;  Priority 4  . POLYPECTOMY  05/19/2019   Procedure: POLYPECTOMY;  Surgeon: [name], MD;  Location: [place];  Service: Endoscopy;;    Home Medications:  Allergies as of 08/08/2019   No Known Allergies     Medication List       Accurate as of Aug 08, 2019  3:50 PM. If you have any  questions, ask your nurse or doctor.        STOP taking these medications   bisacodyl 5 MG EC tablet Commonly known as: DULCOLAX Stopped by: Hollice Espy, MD   erythromycin base 500 MG tablet Commonly known as: E-MYCIN Stopped by: Hollice Espy, MD     TAKE these medications   levothyroxine 150 MCG tablet Commonly known as: SYNTHROID TAKE 1 TABLET BY MOUTH EVERY DAY   naproxen sodium 220 MG tablet Commonly known as: ALEVE Take 440 mg by mouth 2 (two) times daily as needed (pain.).   neomycin 500 MG tablet Commonly known as: MYCIFRADIN Take 2 tablet at 8am, take 2 tablets at 2pm, and take 2 tablets at 8pm the day prior to your surgery   polyethylene glycol powder 17 GM/SCOOP powder Commonly known as: MiraLax Mix full container in 64 ounces of Gatorade or other clear liquid. No Red       Allergies: No Known Allergies  Family History: Family History  Problem Relation Age of Onset  . Lung cancer Mother 65  . Diabetes Paternal Grandmother   . Colon cancer Sister 90    Social History:  reports that he quit smoking about 18 years ago. His smoking use included cigarettes. He quit after 10.00 years of use. He has never used smokeless tobacco. He reports previous drug use. Drugs: Cocaine and Marijuana. He reports that he does not drink alcohol.   Physical Exam: BP 134/89   Pulse 89   Constitutional:  Alert and oriented, No acute distress. HEENT: [place] AT, moist mucus membranes.  Trachea midline, no masses. Cardiovascular: No clubbing, cyanosis, or edema. Respiratory: Normal respiratory effort, no increased work of breathing. Skin: No rashes, bruises or suspicious lesions. Neurologic: Grossly intact, no focal deficits, moving all 4 extremities. Psychiatric: Normal mood and affect.  Laboratory Data:  Lab Results  Component Value Date   CREATININE 1.10 07/31/2019    Lab Results  Component Value Date   PSA 7.5 (H) 04/07/2019   PSA 6.55 (H) 06/09/2018    Lab  Results  Component Value Date   HGBA1C 6.1 (H) 04/07/2019   Assessment & Plan:    1. Intermediate risk prostate cancer, unfavorable  The patient was counseled about the natural history of prostate cancer and the standard treatment options that are available for prostate  cancer. It was explained to him how his age and life expectancy, clinical stage, Gleason score, and PSA affect his prognosis, the decision to proceed with additional staging studies, as well as how that information influences recommended treatment strategies. We discussed the roles for active surveillance, radiation therapy, surgical therapy, androgen deprivation, as well as ablative therapy options for the treatment of prostate cancer as appropriate to his individual cancer situation. We discussed the risks and benefits of these options with regard to their impact on cancer control and also in terms of potential adverse events, complications, and impact on quality of life particularly related to urinary, bowel, and sexual function. The patient was encouraged to ask questions throughout the discussion today and all questions were answered to his stated satisfaction. In addition, the patient was provided with and/or directed to appropriate resources and literature for further education about prostate cancer treatment options.  We discussed surgical therapy for prostate cancer including the different available surgical approaches.  Specifically, we discussed robotic prostatectomy with pelvic lymph node dissection based on his restratification.  We discussed, in detail, the risks and expectations of surgery with regard to cancer control, urinary control, and erectile dysfunction as well as expected post operative recovery processed. Additional risks of surgery including but not limited to bleeding, infection, hernia formation, nerve damage, fistula formation, bowel/rectal injury, potentially necessitating colostomy, damage to the urinary tract  resulting in urinary leakage, urethral stricture, and cardiopulmonary risk such as myocardial infarction, stroke, death, thromboembolism etc. were explained.   He is hesitant to have a prostatectomy since he does not want to wear pull-ups w/ concerns for work. He is leaning towards doing surgery in conjunction w/ partial colectomy. He will talk to his fiance and will call back w/ decision. Book given on prostate cancer.   Case discussed extensively with Dr. Dahlia Byes today and he is in agreement to do a combined procedure.    Offered referral to radiation oncology, declined.  Urgent referral to physical therapy, start Kegel exercises ASAP.  Pre-op UA/culture today    [facility] [address] [place], [place] [address] [phone]  I, Lucas Mallow, am acting as a scribe for Dr. Hollice Espy,  I have reviewed the above documentation for accuracy and completeness, and I agree with the above.   Hollice Espy, MD  I spent 55 total minutes on the day of the encounter including pre-visit review of the medical record, face-to-face time with the patient, and post visit ordering of labs/imaging/tests.

## 2019-08-08 ENCOUNTER — Other Ambulatory Visit: Payer: Self-pay | Admitting: Radiology

## 2019-08-08 ENCOUNTER — Encounter: Payer: Self-pay | Admitting: Urology

## 2019-08-08 ENCOUNTER — Telehealth: Payer: Self-pay | Admitting: Radiology

## 2019-08-08 ENCOUNTER — Ambulatory Visit (INDEPENDENT_AMBULATORY_CARE_PROVIDER_SITE_OTHER): Payer: 59 | Admitting: Urology

## 2019-08-08 ENCOUNTER — Other Ambulatory Visit: Payer: Self-pay

## 2019-08-08 VITALS — BP 134/89 | HR 89

## 2019-08-08 DIAGNOSIS — Z87898 Personal history of other specified conditions: Secondary | ICD-10-CM

## 2019-08-08 DIAGNOSIS — C61 Malignant neoplasm of prostate: Secondary | ICD-10-CM

## 2019-08-08 NOTE — Telephone Encounter (Signed)
Patient [facility] stating he would like to have prostate surgery discussed with Dr Erlene Quan. Need orders please.

## 2019-08-09 LAB — MICROSCOPIC EXAMINATION: Bacteria, UA: NONE SEEN

## 2019-08-09 LAB — URINALYSIS, COMPLETE
Bilirubin, UA: NEGATIVE
Glucose, UA: NEGATIVE
Ketones, UA: NEGATIVE
Leukocytes,UA: NEGATIVE
Nitrite, UA: NEGATIVE
Protein,UA: NEGATIVE
Specific Gravity, UA: 1.025 (ref 1.005–1.030)
Urobilinogen, Ur: 0.2 mg/dL (ref 0.2–1.0)
pH, UA: 5 (ref 5.0–7.5)

## 2019-08-10 ENCOUNTER — Other Ambulatory Visit: Payer: Self-pay

## 2019-08-10 ENCOUNTER — Encounter
Admission: RE | Admit: 2019-08-10 | Discharge: 2019-08-10 | Disposition: A | Discharge status: Home / Self Care | Payer: 59 | Source: Ambulatory Visit | Attending: Surgery | Admitting: Surgery

## 2019-08-10 NOTE — Patient Instructions (Signed)
Your procedure is scheduled on: 08/17/19 Report to [place]. To find out your arrival time please call [phone] between 1PM - 3PM on 08/16/19.  Remember: Instructions that are not followed completely may result in serious medical risk, up to and including death, or upon the discretion of your surgeon and anesthesiologist your surgery may need to be rescheduled.     _X__ 1. Follow Dr Corlis Leak instructions on diet and bowel preparation for your surgery.  __X__2.  On the morning of surgery brush your teeth with toothpaste and water, you                 may rinse your mouth with mouthwash if you wish.  Do not swallow any              toothpaste of mouthwash.     _X__ 3.  No Alcohol for 24 hours before or after surgery.   _X__ 4.  Do Not Smoke or use e-cigarettes For 24 Hours Prior to Your Surgery.                 Do not use any chewable tobacco products for at least 6 hours prior to                 surgery.  ____  5.  Bring all medications with you on the day of surgery if instructed.   __X__  6.  Notify your doctor if there is any change in your medical condition      (cold, fever, infections).     Do not wear jewelry, make-up, hairpins, clips or nail polish. Do not wear lotions, powders, or perfumes.  Do not shave 48 hours prior to surgery. Men may shave face and neck. Do not bring valuables to the hospital.    [facility] is not responsible for any belongings or valuables.  Contacts, dentures/partials or body piercings may not be worn into surgery. Bring a case for your contacts, glasses or hearing aids, a denture cup will be supplied. Leave your suitcase in the car. After surgery it may be brought to your room. For patients admitted to the hospital, discharge time is determined by your treatment team.   Patients discharged the day of surgery will not be allowed to drive home.   Please read over the following fact sheets  that you were given:   MRSA Information  __X__ Take these medicines the morning of surgery with A SIP OF WATER:    1. levothyroxine (SYNTHROID) 150 MCG tablet  2.   3.   4.  5.  6.  ____ Fleet Enema (as directed)   __X__ Use CHG Soap/SAGE wipes as directed  ____ Use inhalers on the day of surgery  ____ Stop metformin/Janumet/Farxiga 2 days prior to surgery    ____ Take 1/2 of usual insulin dose the night before surgery. No insulin the morning          of surgery.   ____ Stop Blood Thinners Coumadin/Plavix/Xarelto/Pleta/Pradaxa/Eliquis/Effient/Aspirin  on   Or contact your Surgeon, Cardiologist or Medical Doctor regarding  ability to stop your blood thinners  __X__ Stop Anti-inflammatories 7 days before surgery such as Advil, Ibuprofen, Motrin,  BC or Goodies Powder, Naprosyn, Naproxen, Aleve, Aspirin    __X__ Stop all herbal supplements, fish oil or vitamin E until after surgery.    ____ Bring C-Pap to the hospital.      Indwelling Urinary Catheter Care, Adult  An indwelling urinary catheter is a thin, flexible, germ-free (sterile) tube that is placed into the bladder to help drain urine out of the body. The catheter is inserted into the part of the body that drains urine from the bladder (urethra). Urine drains from the catheter into a drainage bag outside of the body. Taking good care of your catheter will keep it working properly and help to prevent problems from developing. What are the risks?  Bacteria may get into your bladder and cause a urinary tract infection.  Urine flow can become blocked. This can happen if the catheter is not working correctly, or if you have sediment or a blood clot in your bladder or the catheter.  Tissue near the catheter may become irritated and bleed. How to wear your catheter and your drainage bag Supplies needed  Adhesive tape or a leg strap.  Alcohol wipe or soap and water (if you use tape).  A clean towel (if you use  tape).  Overnight drainage bag.  Smaller drainage bag (leg bag). Wearing your catheter and bag Use adhesive tape or a leg strap to attach your catheter to your leg.  Make sure the catheter is not pulled tight.  If a leg strap gets wet, replace it with a dry one.  If you use adhesive tape: 1. Use an alcohol wipe or soap and water to wash off any stickiness on your skin where you had tape before. 2. Use a clean towel to pat-dry the area. 3. Apply the new tape. You should have received a large overnight drainage bag and a smaller leg bag that fits underneath clothing.  You may wear the overnight bag at any time, but you should not wear the leg bag at night.  Always wear the leg bag below your knee.  Make sure the overnight drainage bag is always lower than the level of your bladder, but do not let it touch the floor. Before you go to sleep, hang the bag inside a wastebasket that is covered by a clean plastic bag. How to care for your skin around the catheter     Supplies needed  A clean washcloth.  Water and mild soap.  A clean towel. Caring for your skin and catheter  Every day, use a clean washcloth and soapy water to clean the skin around your catheter. 1. Wash your hands with soap and water. 2. Wet a washcloth in warm water and mild soap. 3. Clean the skin around your urethra.  If you are male:  Use one hand to gently spread the folds of skin around your vagina (labia).  With the washcloth in your other hand, wipe the inner side of your labia on each side. Do this in a front-to-back direction.  If you are male:  Use one hand to pull back any skin that covers the end of your penis (foreskin).  With the washcloth in your other hand, wipe your penis in small circles. Start wiping at the tip of your penis, then move outward from the catheter.  Move the foreskin back in place, if this applies. 4. With your free hand, hold the catheter close to where it enters your  body. Keep holding the catheter during cleaning so it does not get pulled out. 5. Use your other hand to clean the catheter with the washcloth.  Only wipe downward on the catheter.  Do not wipe upward toward your body, because that may push bacteria into your urethra and cause infection. 6.  Use a clean towel to pat-dry the catheter and the skin around it. Make sure to wipe off all soap. 7. Wash your hands with soap and water.  Shower every day. Do not take baths.  Do not use cream, ointment, or lotion on the area where the catheter enters your body, unless your health care provider tells you to do that.  Do not use powders, sprays, or lotions on your genital area.  Check your skin around the catheter every day for signs of infection. Check for: ? Redness, swelling, or pain. ? Fluid or blood. ? Warmth. ? Pus or a bad smell. How to empty the drainage bag Supplies needed  Rubbing alcohol.  Gauze pad or cotton ball.  Adhesive tape or a leg strap. Emptying the bag Empty your drainage bag (your overnight drainage bag or your leg bag) when it is ?- full, or at least 2-3 times a day. Clean the drainage bag according to the manufacturer's instructions or as told by your health care provider. 1. Wash your hands with soap and water. 2. Detach the drainage bag from your leg. 3. Hold the drainage bag over the toilet or a clean container. Make sure the drainage bag is lower than your hips and bladder. This stops urine from going back into the tubing and into your bladder. 4. Open the pour spout at the bottom of the bag. 5. Empty the urine into the toilet or container. Do not let the pour spout touch any surface. This precaution is important to prevent bacteria from getting in the bag and causing infection. 6. Apply rubbing alcohol to a gauze pad or cotton ball. 7. Use the gauze pad or cotton ball to clean the pour spout. 8. Close the pour spout. 9. Attach the bag to your leg with adhesive  tape or a leg strap. 10. Wash your hands with soap and water. How to change the drainage bag Supplies needed:  Alcohol wipes.  A clean drainage bag.  Adhesive tape or a leg strap. Changing the bag Replace your drainage bag with a clean bag if it leaks, starts to smell bad, or looks dirty. 1. Wash your hands with soap and water. 2. Detach the dirty drainage bag from your leg. 3. Pinch the catheter with your fingers so that urine does not spill out. 4. Disconnect the catheter tube from the drainage tube at the connection valve. Do not let the tubes touch any surface. 5. Clean the end of the catheter tube with an alcohol wipe. Use a different alcohol wipe to clean the end of the drainage tube. 6. Connect the catheter tube to the drainage tube of the clean bag. 7. Attach the clean bag to your leg with adhesive tape or a leg strap. Avoid attaching the new bag too tightly. 8. Wash your hands with soap and water. General instructions   Never pull on your catheter or try to remove it. Pulling can damage your internal tissues.  Always wash your hands before and after you handle your catheter or drainage bag. Use a mild, fragrance-free soap. If soap and water are not available, use hand sanitizer.  Always make sure there are no twists or bends (kinks) in the catheter tube.  Always make sure there are no leaks in the catheter or drainage bag.  Drink enough fluid to keep your urine pale yellow.  Do not take baths, swim, or use a hot tub.  If you are male, wipe from front to back after having  a bowel movement. Contact a health care provider if:  Your urine is cloudy.  Your urine smells unusually bad.  Your catheter gets clogged.  Your catheter starts to leak.  Your bladder feels full. Get help right away if:  You have redness, swelling, or pain where the catheter enters your body.  You have fluid, blood, pus, or a bad smell coming from the area where the catheter enters your  body.  The area where the catheter enters your body feels warm to the touch.  You have a fever.  You have pain in your abdomen, legs, lower back, or bladder.  You see blood in the catheter.  Your urine is pink or red.  You have nausea, vomiting, or chills.  Your urine is not draining into the bag.  Your catheter gets pulled out. Summary  An indwelling urinary catheter is a thin, flexible, germ-free (sterile) tube that is placed into the bladder to help drain urine out of the body.  The catheter is inserted into the part of the body that drains urine from the bladder (urethra).  Take good care of your catheter to keep it working properly and help prevent problems from developing.  Always wash your hands before and after you handle your catheter or drainage bag.  Never pull on your catheter or try to remove it. This information is not intended to replace advice given to you by your health care provider. Make sure you discuss any questions you have with your health care provider. Document Revised: 07/01/2018 Document Reviewed: 10/23/2016 Elsevier Patient Education  [employer].

## 2019-08-14 ENCOUNTER — Telehealth: Payer: Self-pay | Admitting: *Deleted

## 2019-08-14 NOTE — Telephone Encounter (Signed)
Faxed FMLA to [employer] at [phone] 

## 2019-08-15 ENCOUNTER — Other Ambulatory Visit: Payer: Self-pay

## 2019-08-15 ENCOUNTER — Other Ambulatory Visit
Admission: RE | Admit: 2019-08-15 | Discharge: 2019-08-15 | Disposition: A | Discharge status: Home / Self Care | Payer: 59 | Source: Ambulatory Visit | Attending: Surgery | Admitting: Surgery

## 2019-08-15 DIAGNOSIS — Z01812 Encounter for preprocedural laboratory examination: Secondary | ICD-10-CM | POA: Insufficient documentation

## 2019-08-15 DIAGNOSIS — Z20822 Contact with and (suspected) exposure to covid-19: Secondary | ICD-10-CM | POA: Insufficient documentation

## 2019-08-15 LAB — TYPE AND SCREEN
ABO/RH(D): O POS
Antibody Screen: NEGATIVE

## 2019-08-15 LAB — PROTIME-INR
INR: 0.9 (ref 0.8–1.2)
Prothrombin Time: 11.9 seconds (ref 11.4–15.2)

## 2019-08-15 LAB — CBC
HCT: 42.1 % (ref 39.0–52.0)
Hemoglobin: 13.3 g/dL (ref 13.0–17.0)
MCH: 24.3 pg — ABNORMAL LOW (ref 26.0–34.0)
MCHC: 31.6 g/dL (ref 30.0–36.0)
MCV: 77 fL — ABNORMAL LOW (ref 80.0–100.0)
Platelets: 346 10*3/uL (ref 150–400)
RBC: 5.47 MIL/uL (ref 4.22–5.81)
RDW: 14.5 % (ref 11.5–15.5)
WBC: 8.1 10*3/uL (ref 4.0–10.5)
nRBC: 0 % (ref 0.0–0.2)

## 2019-08-15 LAB — BASIC METABOLIC PANEL
Anion gap: 6 (ref 5–15)
BUN: 10 mg/dL (ref 8–23)
CO2: 25 mmol/L (ref 22–32)
Calcium: 9.2 mg/dL (ref 8.9–10.3)
Chloride: 108 mmol/L (ref 98–111)
Creatinine, Ser: 0.84 mg/dL (ref 0.61–1.24)
GFR calc Af Amer: >60 mL/min (ref >60)
GFR calc non Af Amer: >60 mL/min (ref >60)
Glucose, Bld: 97 mg/dL (ref 70–99)
Potassium: 3.8 mmol/L (ref 3.5–5.1)
Sodium: 139 mmol/L (ref 135–145)

## 2019-08-15 LAB — SARS CORONAVIRUS 2 (TAT 6-24 HRS): SARS Coronavirus 2: NEGATIVE

## 2019-08-16 ENCOUNTER — Telehealth: Payer: Self-pay | Admitting: *Deleted

## 2019-08-16 MED ORDER — SODIUM CHLORIDE 0.9 % IV SOLN
2.0000 g | INTRAVENOUS | Status: AC
  Administered 2019-08-17: 2 g via INTRAVENOUS

## 2019-08-16 NOTE — Telephone Encounter (Signed)
Patient called and is having surgery tomorrow with Dr Dahlia Byes right colectomy and he wants to know when he should start taking his laxative

## 2019-08-16 NOTE — Telephone Encounter (Signed)
Per Dr.Pabon patient needs to start drinking the bowel prep drink now and has to have it completed by midnight along with antibiotics before surgery tomorrow. Patient states the last time he had anything to eat was at 8 o'clock this morning. Patient is not to have anything else to eat or drink after starting the drink. Patient verbalized understanding and has no further questions.

## 2019-08-17 ENCOUNTER — Other Ambulatory Visit: Payer: Self-pay

## 2019-08-17 ENCOUNTER — Inpatient Hospital Stay: Payer: 59 | Admitting: Anesthesiology

## 2019-08-17 ENCOUNTER — Inpatient Hospital Stay
Admission: RE | Admit: 2019-08-17 | Discharge: 2019-08-30 | DRG: 707 | Disposition: A | Discharge status: Home / Self Care | Payer: 59 | Attending: Surgery | Admitting: Surgery

## 2019-08-17 ENCOUNTER — Encounter: Payer: Self-pay | Admitting: Surgery

## 2019-08-17 ENCOUNTER — Encounter
Admission: RE | Disposition: A | Discharge status: Home / Self Care | Payer: Self-pay | Source: Ambulatory Visit | Attending: Surgery

## 2019-08-17 DIAGNOSIS — K219 Gastro-esophageal reflux disease without esophagitis: Secondary | ICD-10-CM | POA: Diagnosis present

## 2019-08-17 DIAGNOSIS — D72829 Elevated white blood cell count, unspecified: Secondary | ICD-10-CM | POA: Diagnosis present

## 2019-08-17 DIAGNOSIS — Z9889 Other specified postprocedural states: Secondary | ICD-10-CM

## 2019-08-17 DIAGNOSIS — Z79899 Other long term (current) drug therapy: Secondary | ICD-10-CM

## 2019-08-17 DIAGNOSIS — Z7901 Long term (current) use of anticoagulants: Secondary | ICD-10-CM

## 2019-08-17 DIAGNOSIS — Z791 Long term (current) use of non-steroidal anti-inflammatories (NSAID): Secondary | ICD-10-CM

## 2019-08-17 DIAGNOSIS — Z20822 Contact with and (suspected) exposure to covid-19: Secondary | ICD-10-CM | POA: Diagnosis present

## 2019-08-17 DIAGNOSIS — C61 Malignant neoplasm of prostate: Principal | ICD-10-CM | POA: Diagnosis present

## 2019-08-17 DIAGNOSIS — Z87891 Personal history of nicotine dependence: Secondary | ICD-10-CM | POA: Diagnosis not present

## 2019-08-17 DIAGNOSIS — N400 Enlarged prostate without lower urinary tract symptoms: Secondary | ICD-10-CM | POA: Diagnosis present

## 2019-08-17 DIAGNOSIS — E861 Hypovolemia: Secondary | ICD-10-CM | POA: Diagnosis not present

## 2019-08-17 DIAGNOSIS — D122 Benign neoplasm of ascending colon: Secondary | ICD-10-CM

## 2019-08-17 DIAGNOSIS — E039 Hypothyroidism, unspecified: Secondary | ICD-10-CM | POA: Diagnosis present

## 2019-08-17 DIAGNOSIS — Z9079 Acquired absence of other genital organ(s): Secondary | ICD-10-CM

## 2019-08-17 DIAGNOSIS — Z978 Presence of other specified devices: Secondary | ICD-10-CM

## 2019-08-17 DIAGNOSIS — Z9049 Acquired absence of other specified parts of digestive tract: Secondary | ICD-10-CM | POA: Diagnosis not present

## 2019-08-17 DIAGNOSIS — Z79890 Hormone replacement therapy (postmenopausal): Secondary | ICD-10-CM | POA: Diagnosis not present

## 2019-08-17 DIAGNOSIS — K567 Ileus, unspecified: Secondary | ICD-10-CM | POA: Diagnosis not present

## 2019-08-17 DIAGNOSIS — K9189 Other postprocedural complications and disorders of digestive system: Secondary | ICD-10-CM

## 2019-08-17 DIAGNOSIS — R066 Hiccough: Secondary | ICD-10-CM | POA: Diagnosis not present

## 2019-08-17 DIAGNOSIS — R112 Nausea with vomiting, unspecified: Secondary | ICD-10-CM

## 2019-08-17 DIAGNOSIS — D120 Benign neoplasm of cecum: Secondary | ICD-10-CM | POA: Diagnosis present

## 2019-08-17 DIAGNOSIS — I959 Hypotension, unspecified: Secondary | ICD-10-CM | POA: Diagnosis not present

## 2019-08-17 HISTORY — PX: ROBOT ASSISTED LAPAROSCOPIC RADICAL PROSTATECTOMY: SHX5141

## 2019-08-17 LAB — ABO/RH: ABO/RH(D): O POS

## 2019-08-17 LAB — CBC
HCT: 36.8 % — ABNORMAL LOW (ref 39.0–52.0)
Hemoglobin: 11.7 g/dL — ABNORMAL LOW (ref 13.0–17.0)
MCH: 24.6 pg — ABNORMAL LOW (ref 26.0–34.0)
MCHC: 31.8 g/dL (ref 30.0–36.0)
MCV: 77.5 fL — ABNORMAL LOW (ref 80.0–100.0)
Platelets: 298 10*3/uL (ref 150–400)
RBC: 4.75 MIL/uL (ref 4.22–5.81)
RDW: 14.4 % (ref 11.5–15.5)
WBC: 17 10*3/uL — ABNORMAL HIGH (ref 4.0–10.5)
nRBC: 0 % (ref 0.0–0.2)

## 2019-08-17 LAB — CREATININE, SERUM
Creatinine, Ser: 1.24 mg/dL (ref 0.61–1.24)
GFR calc Af Amer: >60 mL/min (ref >60)
GFR calc non Af Amer: >60 mL/min (ref >60)

## 2019-08-17 LAB — HIV ANTIBODY (ROUTINE TESTING W REFLEX): HIV Screen 4th Generation wRfx: NONREACTIVE

## 2019-08-17 SURGERY — COLECTOMY, PARTIAL, ROBOT-ASSISTED, LAPAROSCOPIC
Anesthesia: General | Laterality: Right

## 2019-08-17 MED ORDER — CHLORHEXIDINE GLUCONATE 0.12 % MT SOLN
OROMUCOSAL | Status: AC
  Administered 2019-08-17: 15 mL via OROMUCOSAL
  Filled 2019-08-17: qty 15

## 2019-08-17 MED ORDER — PROPOFOL 10 MG/ML IV BOLUS
INTRAVENOUS | Status: AC
  Filled 2019-08-17: qty 20

## 2019-08-17 MED ORDER — ONDANSETRON HCL 4 MG/2ML IJ SOLN
INTRAMUSCULAR | Status: DC | PRN
  Administered 2019-08-17: 4 mg via INTRAVENOUS

## 2019-08-17 MED ORDER — LACTATED RINGERS IV SOLN: INTRAVENOUS | Status: DC

## 2019-08-17 MED ORDER — MIDAZOLAM HCL 2 MG/2ML IJ SOLN
INTRAMUSCULAR | Status: AC
  Filled 2019-08-17: qty 2

## 2019-08-17 MED ORDER — ACETAMINOPHEN 10 MG/ML IV SOLN
INTRAVENOUS | Status: DC | PRN
  Administered 2019-08-17: 1000 mg via INTRAVENOUS

## 2019-08-17 MED ORDER — HEPARIN SODIUM (PORCINE) 5000 UNIT/ML IJ SOLN
INTRAMUSCULAR | Status: AC
  Administered 2019-08-17: 5000 [IU] via SUBCUTANEOUS
  Filled 2019-08-17: qty 1

## 2019-08-17 MED ORDER — OXYCODONE HCL 5 MG/5ML PO SOLN: 5.0000 mg | Freq: Once | ORAL | Status: DC | PRN

## 2019-08-17 MED ORDER — HEPARIN SODIUM (PORCINE) 5000 UNIT/ML IJ SOLN: 5000.0000 [IU] | Freq: Once | INTRAMUSCULAR | Status: AC

## 2019-08-17 MED ORDER — ACETAMINOPHEN 10 MG/ML IV SOLN
INTRAVENOUS | Status: AC
  Filled 2019-08-17: qty 100

## 2019-08-17 MED ORDER — PHENYLEPHRINE HCL (PRESSORS) 10 MG/ML IV SOLN
INTRAVENOUS | Status: DC | PRN
  Administered 2019-08-17: 100 ug via INTRAVENOUS
  Administered 2019-08-17: 50 ug via INTRAVENOUS
  Administered 2019-08-17: 100 ug via INTRAVENOUS
  Administered 2019-08-17: 150 ug via INTRAVENOUS

## 2019-08-17 MED ORDER — CHLORHEXIDINE GLUCONATE 0.12 % MT SOLN: 15.0000 mL | Freq: Once | OROMUCOSAL | Status: AC

## 2019-08-17 MED ORDER — KETOROLAC TROMETHAMINE 30 MG/ML IJ SOLN
30.0000 mg | Freq: Four times a day (QID) | INTRAMUSCULAR | Status: DC
  Administered 2019-08-17 – 2019-08-19 (×7): 30 mg via INTRAVENOUS
  Filled 2019-08-17 (×8): qty 1

## 2019-08-17 MED ORDER — LIDOCAINE HCL (PF) 2 % IJ SOLN
INTRAMUSCULAR | Status: AC
  Filled 2019-08-17: qty 5

## 2019-08-17 MED ORDER — KETAMINE HCL 50 MG/ML IJ SOLN
INTRAMUSCULAR | Status: DC | PRN
  Administered 2019-08-17 (×2): 25 mg via INTRAMUSCULAR

## 2019-08-17 MED ORDER — ORAL CARE MOUTH RINSE: 15.0000 mL | Freq: Once | OROMUCOSAL | Status: AC

## 2019-08-17 MED ORDER — SODIUM CHLORIDE (PF) 0.9 % IJ SOLN
INTRAMUSCULAR | Status: AC
  Filled 2019-08-17: qty 50

## 2019-08-17 MED ORDER — FENTANYL CITRATE (PF) 100 MCG/2ML IJ SOLN
INTRAMUSCULAR | Status: AC
  Administered 2019-08-17: 25 ug via INTRAVENOUS
  Filled 2019-08-17: qty 2

## 2019-08-17 MED ORDER — ONDANSETRON HCL 4 MG/2ML IJ SOLN: 4.0000 mg | Freq: Once | INTRAMUSCULAR | Status: DC | PRN

## 2019-08-17 MED ORDER — SODIUM CHLORIDE 0.9 % IV SOLN
2.0000 g | Freq: Once | INTRAVENOUS | Status: DC
  Filled 2019-08-17: qty 2

## 2019-08-17 MED ORDER — LEVOTHYROXINE SODIUM 50 MCG PO TABS
150.0000 ug | ORAL_TABLET | Freq: Every day | ORAL | Status: DC
  Administered 2019-08-18 – 2019-08-29 (×10): 150 ug via ORAL
  Filled 2019-08-17 (×12): qty 1

## 2019-08-17 MED ORDER — SODIUM CHLORIDE 0.9 % IV SOLN
INTRAVENOUS | Status: AC
  Filled 2019-08-17: qty 2

## 2019-08-17 MED ORDER — PROCHLORPERAZINE MALEATE 10 MG PO TABS
10.0000 mg | ORAL_TABLET | Freq: Four times a day (QID) | ORAL | Status: DC | PRN
  Filled 2019-08-17: qty 1

## 2019-08-17 MED ORDER — GABAPENTIN 300 MG PO CAPS: 300.0000 mg | ORAL_CAPSULE | ORAL | Status: AC

## 2019-08-17 MED ORDER — OXYCODONE HCL 5 MG PO TABS: 5.0000 mg | ORAL_TABLET | Freq: Once | ORAL | Status: DC | PRN

## 2019-08-17 MED ORDER — FAMOTIDINE 20 MG PO TABS: 20.0000 mg | ORAL_TABLET | Freq: Once | ORAL | Status: AC

## 2019-08-17 MED ORDER — ONDANSETRON 4 MG PO TBDP: 4.0000 mg | ORAL_TABLET | Freq: Four times a day (QID) | ORAL | Status: DC | PRN

## 2019-08-17 MED ORDER — ALVIMOPAN 12 MG PO CAPS
ORAL_CAPSULE | ORAL | Status: AC
  Administered 2019-08-17: 12 mg via ORAL
  Filled 2019-08-17: qty 1

## 2019-08-17 MED ORDER — BUPIVACAINE HCL (PF) 0.5 % IJ SOLN
INTRAMUSCULAR | Status: AC
  Filled 2019-08-17: qty 30

## 2019-08-17 MED ORDER — LIDOCAINE HCL (CARDIAC) PF 100 MG/5ML IV SOSY
PREFILLED_SYRINGE | INTRAVENOUS | Status: DC | PRN
Start: 2019-08-17 — End: 2019-08-17
  Administered 2019-08-17 (×6): 4.5 mL via INTRAVENOUS
  Administered 2019-08-17: 80 mg via INTRAVENOUS

## 2019-08-17 MED ORDER — ACETAMINOPHEN 500 MG PO TABS
1000.0000 mg | ORAL_TABLET | Freq: Four times a day (QID) | ORAL | Status: DC
  Administered 2019-08-17 – 2019-08-19 (×6): 1000 mg via ORAL
  Filled 2019-08-17 (×7): qty 2

## 2019-08-17 MED ORDER — CHLORHEXIDINE GLUCONATE CLOTH 2 % EX PADS
6.0000 | MEDICATED_PAD | Freq: Every day | CUTANEOUS | Status: DC
  Administered 2019-08-18 – 2019-08-30 (×13): 6 via TOPICAL

## 2019-08-17 MED ORDER — SODIUM CHLORIDE 0.9 % IV SOLN
INTRAVENOUS | Status: DC | PRN
  Administered 2019-08-17: 150 ug via INTRAVENOUS
  Administered 2019-08-17: 100 ug via INTRAVENOUS
  Administered 2019-08-17: 150 ug via INTRAVENOUS

## 2019-08-17 MED ORDER — ALVIMOPAN 12 MG PO CAPS: 12.0000 mg | ORAL_CAPSULE | ORAL | Status: AC

## 2019-08-17 MED ORDER — FENTANYL CITRATE (PF) 100 MCG/2ML IJ SOLN
INTRAMUSCULAR | Status: AC
  Filled 2019-08-17: qty 2

## 2019-08-17 MED ORDER — ACETAMINOPHEN 500 MG PO TABS: 1000.0000 mg | ORAL_TABLET | ORAL | Status: AC

## 2019-08-17 MED ORDER — MIDAZOLAM HCL 2 MG/2ML IJ SOLN
INTRAMUSCULAR | Status: DC | PRN
  Administered 2019-08-17: 2 mg via INTRAVENOUS

## 2019-08-17 MED ORDER — VASOPRESSIN 20 UNIT/ML IV SOLN
INTRAVENOUS | Status: DC | PRN
Start: 2019-08-17 — End: 2019-08-17
  Administered 2019-08-17: 2 [IU] via INTRAVENOUS

## 2019-08-17 MED ORDER — BUPIVACAINE-EPINEPHRINE (PF) 0.5% -1:200000 IJ SOLN
INTRAMUSCULAR | Status: AC
  Filled 2019-08-17: qty 90

## 2019-08-17 MED ORDER — ACETAMINOPHEN 500 MG PO TABS
ORAL_TABLET | ORAL | Status: AC
  Administered 2019-08-17: 1000 mg via ORAL
  Filled 2019-08-17: qty 2

## 2019-08-17 MED ORDER — LACTATED RINGERS IV SOLN: INTRAVENOUS | Status: DC | PRN

## 2019-08-17 MED ORDER — CHLORHEXIDINE GLUCONATE CLOTH 2 % EX PADS
6.0000 | MEDICATED_PAD | Freq: Once | CUTANEOUS | Status: AC
  Administered 2019-08-17: 6 via TOPICAL

## 2019-08-17 MED ORDER — FAMOTIDINE 20 MG PO TABS
ORAL_TABLET | ORAL | Status: AC
  Administered 2019-08-17: 20 mg via ORAL
  Filled 2019-08-17: qty 1

## 2019-08-17 MED ORDER — SUGAMMADEX SODIUM 200 MG/2ML IV SOLN
INTRAVENOUS | Status: DC | PRN
  Administered 2019-08-17: 200 mg via INTRAVENOUS

## 2019-08-17 MED ORDER — DIPHENHYDRAMINE HCL 50 MG/ML IJ SOLN: 12.5000 mg | Freq: Four times a day (QID) | INTRAMUSCULAR | Status: DC | PRN

## 2019-08-17 MED ORDER — SODIUM CHLORIDE 0.9 % IV SOLN: INTRAVENOUS | Status: DC

## 2019-08-17 MED ORDER — PROCHLORPERAZINE EDISYLATE 10 MG/2ML IJ SOLN
5.0000 mg | Freq: Four times a day (QID) | INTRAMUSCULAR | Status: DC | PRN
  Administered 2019-08-22: 10 mg via INTRAVENOUS
  Administered 2019-08-22: 5 mg via INTRAVENOUS
  Filled 2019-08-17 (×3): qty 2

## 2019-08-17 MED ORDER — BUPIVACAINE-EPINEPHRINE (PF) 0.25% -1:200000 IJ SOLN
INTRAMUSCULAR | Status: DC | PRN
  Administered 2019-08-17: 30 mL

## 2019-08-17 MED ORDER — THROMBIN 5000 UNITS EX SOLR
CUTANEOUS | Status: AC
  Filled 2019-08-17: qty 5000

## 2019-08-17 MED ORDER — SODIUM CHLORIDE 0.9 % IV SOLN: INTRAVENOUS | Status: DC | PRN

## 2019-08-17 MED ORDER — SODIUM CHLORIDE 0.9 % IV SOLN
INTRAVENOUS | Status: DC | PRN
  Administered 2019-08-17: 50 ug/min via INTRAVENOUS

## 2019-08-17 MED ORDER — DIPHENHYDRAMINE HCL 12.5 MG/5ML PO ELIX
12.5000 mg | ORAL_SOLUTION | Freq: Four times a day (QID) | ORAL | Status: DC | PRN
  Filled 2019-08-17: qty 5

## 2019-08-17 MED ORDER — KETAMINE HCL 50 MG/ML IJ SOLN
INTRAMUSCULAR | Status: AC
  Filled 2019-08-17: qty 10

## 2019-08-17 MED ORDER — INDOCYANINE GREEN 25 MG IV SOLR
INTRAVENOUS | Status: DC | PRN
Start: 2019-08-17 — End: 2019-08-17
  Administered 2019-08-17 (×2): 5 mg via INTRAVENOUS

## 2019-08-17 MED ORDER — MORPHINE SULFATE (PF) 2 MG/ML IV SOLN
2.0000 mg | INTRAVENOUS | Status: DC | PRN
  Administered 2019-08-21 – 2019-08-22 (×2): 2 mg via INTRAVENOUS
  Filled 2019-08-17 (×2): qty 1

## 2019-08-17 MED ORDER — OXYCODONE HCL 5 MG PO TABS
5.0000 mg | ORAL_TABLET | ORAL | Status: DC | PRN
  Administered 2019-08-18: 5 mg via ORAL
  Administered 2019-08-22: 10 mg via ORAL
  Filled 2019-08-17: qty 1
  Filled 2019-08-17: qty 2

## 2019-08-17 MED ORDER — HEMOSTATIC AGENTS (NO CHARGE) OPTIME
TOPICAL | Status: DC | PRN
  Administered 2019-08-17: 1 via TOPICAL

## 2019-08-17 MED ORDER — THROMBIN 5000 UNITS EX SOLR
CUTANEOUS | Status: DC | PRN
  Administered 2019-08-17: 5000 [IU] via TOPICAL

## 2019-08-17 MED ORDER — ROCURONIUM BROMIDE 100 MG/10ML IV SOLN
INTRAVENOUS | Status: DC | PRN
  Administered 2019-08-17: 20 mg via INTRAVENOUS
  Administered 2019-08-17: 50 mg via INTRAVENOUS
  Administered 2019-08-17: 30 mg via INTRAVENOUS
  Administered 2019-08-17: 10 mg via INTRAVENOUS
  Administered 2019-08-17: 20 mg via INTRAVENOUS
  Administered 2019-08-17: 10 mg via INTRAVENOUS

## 2019-08-17 MED ORDER — GUAIFENESIN 100 MG/5ML PO SOLN
5.0000 mL | ORAL | Status: DC | PRN
  Administered 2019-08-17 – 2019-08-18 (×2): 100 mg via ORAL
  Filled 2019-08-17 (×4): qty 5

## 2019-08-17 MED ORDER — ENOXAPARIN SODIUM 40 MG/0.4ML ~~LOC~~ SOLN
40.0000 mg | SUBCUTANEOUS | Status: DC
  Administered 2019-08-18 – 2019-08-30 (×11): 40 mg via SUBCUTANEOUS
  Filled 2019-08-17 (×11): qty 0.4

## 2019-08-17 MED ORDER — ONDANSETRON HCL 4 MG/2ML IJ SOLN
4.0000 mg | Freq: Four times a day (QID) | INTRAMUSCULAR | Status: DC | PRN
  Administered 2019-08-20 – 2019-08-24 (×5): 4 mg via INTRAVENOUS
  Filled 2019-08-17 (×7): qty 2

## 2019-08-17 MED ORDER — BUPIVACAINE LIPOSOME 1.3 % IJ SUSP
INTRAMUSCULAR | Status: DC | PRN
  Administered 2019-08-17: 20 mL

## 2019-08-17 MED ORDER — PROPOFOL 10 MG/ML IV BOLUS
INTRAVENOUS | Status: DC | PRN
  Administered 2019-08-17: 160 mg via INTRAVENOUS
  Administered 2019-08-17: 30 mg via INTRAVENOUS

## 2019-08-17 MED ORDER — SEVOFLURANE IN SOLN
RESPIRATORY_TRACT | Status: AC
  Filled 2019-08-17: qty 250

## 2019-08-17 MED ORDER — BUPIVACAINE-EPINEPHRINE (PF) 0.25% -1:200000 IJ SOLN
INTRAMUSCULAR | Status: AC
  Filled 2019-08-17: qty 30

## 2019-08-17 MED ORDER — GABAPENTIN 300 MG PO CAPS
ORAL_CAPSULE | ORAL | Status: AC
  Administered 2019-08-17: 300 mg via ORAL
  Filled 2019-08-17: qty 1

## 2019-08-17 MED ORDER — FENTANYL CITRATE (PF) 100 MCG/2ML IJ SOLN
INTRAMUSCULAR | Status: DC | PRN
  Administered 2019-08-17 (×2): 50 ug via INTRAVENOUS
  Administered 2019-08-17 (×2): 25 ug via INTRAVENOUS
  Administered 2019-08-17 (×2): 50 ug via INTRAVENOUS

## 2019-08-17 MED ORDER — BUPIVACAINE LIPOSOME 1.3 % IJ SUSP
INTRAMUSCULAR | Status: AC
  Filled 2019-08-17: qty 20

## 2019-08-17 MED ORDER — FENTANYL CITRATE (PF) 100 MCG/2ML IJ SOLN
25.0000 ug | INTRAMUSCULAR | Status: DC | PRN
  Administered 2019-08-17 (×3): 25 ug via INTRAVENOUS

## 2019-08-17 MED ORDER — SODIUM CHLORIDE 0.9 % IV SOLN
2.0000 g | INTRAVENOUS | Status: AC
  Filled 2019-08-17: qty 2

## 2019-08-17 SURGICAL SUPPLY — 143 items
"PENCIL ELECTRO HAND CTR " (MISCELLANEOUS) ×6 IMPLANT
ANCHOR TIS RET SYS 1550ML (BAG) ×1 IMPLANT
ANCHOR TIS RET SYS 235ML (MISCELLANEOUS) ×4 IMPLANT
APPLICATOR SURGIFLO ENDO (HEMOSTASIS) ×4 IMPLANT
APPLIER CLIP LOGIC TI 5 (MISCELLANEOUS) IMPLANT
BAG URINE DRAIN 2000ML AR STRL (UROLOGICAL SUPPLIES) ×4 IMPLANT
BLADE CLIPPER SURG (BLADE) ×4 IMPLANT
BULB RESERV EVAC DRAIN JP 100C (MISCELLANEOUS) IMPLANT
CANISTER SUCT 1200ML W/VALVE (MISCELLANEOUS) ×8 IMPLANT
CANNULA REDUC XI 12-8 STAPL (CANNULA) ×1
CANNULA REDUCER 12-8 DVNC XI (CANNULA) ×3 IMPLANT
CATH DRAINAGE MALECOT 26FR (CATHETERS) ×3 IMPLANT
CATH FOL 2WAY LX 18X5 (CATHETERS) ×8 IMPLANT
CATH MALECOT (CATHETERS) ×4
CHLORAPREP W/TINT 26 (MISCELLANEOUS) ×11 IMPLANT
CLIP VESOLOCK LG 6/CT PURPLE (CLIP) ×15 IMPLANT
CLIP VESOLOCK MED LG 6/CT (CLIP) ×4 IMPLANT
COUNTER NEEDLE 20/40 LG (NEEDLE) ×1 IMPLANT
COVER TIP SHEARS 8 DVNC (MISCELLANEOUS) ×6 IMPLANT
COVER TIP SHEARS 8MM DA VINCI (MISCELLANEOUS) ×2
COVER WAND RF STERILE (DRAPES) ×8 IMPLANT
CUP MEDICINE 2OZ PLAST GRAD ST (MISCELLANEOUS) ×1 IMPLANT
DECANTER SPIKE VIAL GLASS SM (MISCELLANEOUS) ×4 IMPLANT
DEFOGGER SCOPE WARMER CLEARIFY (MISCELLANEOUS) ×7 IMPLANT
DERMABOND ADVANCED (GAUZE/BANDAGES/DRESSINGS) ×3
DERMABOND ADVANCED .7 DNX12 (GAUZE/BANDAGES/DRESSINGS) ×6 IMPLANT
DRAIN CHANNEL JP 15F RND 16 (MISCELLANEOUS) IMPLANT
DRAIN CHANNEL JP 19F (MISCELLANEOUS) IMPLANT
DRAPE 3/4 80X56 (DRAPES) ×8 IMPLANT
DRAPE ARM DVNC X/XI (DISPOSABLE) ×12 IMPLANT
DRAPE COLUMN DVNC XI (DISPOSABLE) ×6 IMPLANT
DRAPE DA VINCI XI ARM (DISPOSABLE) ×4
DRAPE DA VINCI XI COLUMN (DISPOSABLE) ×1
DRAPE LEGGINS SURG 28X43 STRL (DRAPES) ×4 IMPLANT
DRAPE SURG 17X11 SM STRL (DRAPES) ×12 IMPLANT
DRAPE UNDER BUTTOCK W/FLU (DRAPES) ×4 IMPLANT
DRSG TELFA 3X8 NADH (GAUZE/BANDAGES/DRESSINGS) ×4 IMPLANT
ELECT BLADE 6.5 EXT (BLADE) ×4 IMPLANT
ELECT CAUTERY BLADE 6.4 (BLADE) ×4 IMPLANT
ELECT REM PT RETURN 9FT ADLT (ELECTROSURGICAL) ×4
ELECTRODE REM PT RTRN 9FT ADLT (ELECTROSURGICAL) ×6 IMPLANT
GLOVE BIO SURGEON STRL SZ 6.5 (GLOVE) ×8 IMPLANT
GLOVE BIO SURGEON STRL SZ7 (GLOVE) ×12 IMPLANT
GLOVE BIOGEL PI IND STRL 7.5 (GLOVE) ×3 IMPLANT
GLOVE BIOGEL PI INDICATOR 7.5 (GLOVE) ×1
GOWN STRL REUS W/ TWL LRG LVL3 (GOWN DISPOSABLE) ×21 IMPLANT
GOWN STRL REUS W/ TWL XL LVL3 (GOWN DISPOSABLE) ×3 IMPLANT
GOWN STRL REUS W/TWL LRG LVL3 (GOWN DISPOSABLE) ×8
GOWN STRL REUS W/TWL XL LVL3 (GOWN DISPOSABLE)
GRASPER LAPSCPC 5X45 DSP (INSTRUMENTS) ×4 IMPLANT
GRASPER SUT TROCAR 14GX15 (MISCELLANEOUS) ×4 IMPLANT
HANDLE YANKAUER SUCT BULB TIP (MISCELLANEOUS) ×4 IMPLANT
HEMOSTAT SURGICEL 2X14 (HEMOSTASIS) ×1 IMPLANT
HOLDER FOLEY CATH W/STRAP (MISCELLANEOUS) ×4 IMPLANT
IRRIGATION STRYKERFLOW (MISCELLANEOUS) ×3 IMPLANT
IRRIGATOR STRYKERFLOW (MISCELLANEOUS) ×4
IV LACTATED RINGERS 1000ML (IV SOLUTION) ×4 IMPLANT
IV NS 1000ML (IV SOLUTION)
IV NS 1000ML BAXH (IV SOLUTION) IMPLANT
KIT IMAGING PINPOINTPAQ (MISCELLANEOUS) ×4 IMPLANT
KIT PINK PAD W/HEAD ARE REST (MISCELLANEOUS) ×4
KIT PINK PAD W/HEAD ARM REST (MISCELLANEOUS) ×6 IMPLANT
LABEL OR SOLS (LABEL) ×8 IMPLANT
MARKER SKIN DUAL TIP RULER LAB (MISCELLANEOUS) ×4 IMPLANT
NDL INSUFFLATION 14GA 120MM (NEEDLE) ×3 IMPLANT
NEEDLE HYPO 22GX1.5 SAFETY (NEEDLE) ×8 IMPLANT
NEEDLE INSUFFLATION 14GA 120MM (NEEDLE) IMPLANT
NS IRRIG 500ML POUR BTL (IV SOLUTION) ×7 IMPLANT
OBTURATOR OPTICAL STANDARD 8MM (TROCAR) ×2
OBTURATOR OPTICAL STND 8 DVNC (TROCAR) ×6
OBTURATOR OPTICALSTD 8 DVNC (TROCAR) ×6 IMPLANT
PACK COLON CLEAN CLOSURE (MISCELLANEOUS) ×4 IMPLANT
PACK LAP CHOLECYSTECTOMY (MISCELLANEOUS) ×8 IMPLANT
PAD DRESSING TELFA 3X8 NADH (GAUZE/BANDAGES/DRESSINGS) ×3 IMPLANT
PENCIL ELECTRO HAND CTR (MISCELLANEOUS) ×4 IMPLANT
PORT ACCESS TROCAR AIRSEAL 5 (TROCAR) ×4 IMPLANT
RELOAD STAPLE 60 2.5 WHT DVNC (STAPLE) ×3 IMPLANT
RELOAD STAPLE 60 2.6 WHT THN (STAPLE) IMPLANT
RELOAD STAPLE 60 3.5 BLU DVNC (STAPLE) ×9 IMPLANT
RELOAD STAPLER 2.5X60 WHT DVNC (STAPLE) ×3 IMPLANT
RELOAD STAPLER 3.5X60 BLU DVNC (STAPLE) ×9 IMPLANT
RELOAD STAPLER WHITE 60MM (STAPLE) ×3 IMPLANT
SEAL CANN UNIV 5-8 DVNC XI (MISCELLANEOUS) ×21 IMPLANT
SEAL XI 5MM-8MM UNIVERSAL (MISCELLANEOUS) ×7
SEALER VESSEL DA VINCI XI (MISCELLANEOUS) ×1
SEALER VESSEL EXT DVNC XI (MISCELLANEOUS) IMPLANT
SET TRI-LUMEN FLTR TB AIRSEAL (TUBING) ×4 IMPLANT
SET TUBE SMOKE EVAC HIGH FLOW (TUBING) ×3 IMPLANT
SOLUTION ELECTROLUBE (MISCELLANEOUS) ×7 IMPLANT
SPOGE SURGIFLO 8M (HEMOSTASIS) ×1
SPONGE LAP 18X18 RF (DISPOSABLE) ×7 IMPLANT
SPONGE LAP 4X18 RFD (DISPOSABLE) ×7 IMPLANT
SPONGE SURGIFLO 8M (HEMOSTASIS) ×3 IMPLANT
SPONGE VERSALON 4X4 4PLY (MISCELLANEOUS) IMPLANT
STAPLE ECHEON FLEX 60 POW ENDO (STAPLE) ×1 IMPLANT
STAPLER 60 DA VINCI SURE FORM (STAPLE) ×1
STAPLER 60 SUREFORM DVNC (STAPLE) ×3 IMPLANT
STAPLER CANNULA SEAL DVNC XI (STAPLE) ×3 IMPLANT
STAPLER CANNULA SEAL XI (STAPLE) ×1
STAPLER RELOAD 2.5X60 WHITE (STAPLE) ×1
STAPLER RELOAD 2.5X60 WHT DVNC (STAPLE) ×3
STAPLER RELOAD 3.5X60 BLU DVNC (STAPLE) ×9
STAPLER RELOAD 3.5X60 BLUE (STAPLE) ×3
STAPLER RELOAD WHITE 60MM (STAPLE) ×4
STAPLER SKIN PROX 35W (STAPLE) ×3 IMPLANT
STRAP SAFETY 5IN WIDE (MISCELLANEOUS) ×7 IMPLANT
SURGILUBE 2OZ TUBE FLIPTOP (MISCELLANEOUS) ×4 IMPLANT
SUT DVC VLOC 3-0 CL 6 P-12 (SUTURE) ×4 IMPLANT
SUT DVC VLOC 90 3-0 CV23 UNDY (SUTURE) ×8 IMPLANT
SUT DVC VLOC 90 3-0 CV23 VLT (SUTURE)
SUT ETHILON 3-0 FS-10 30 BLK (SUTURE)
SUT MNCRL 4-0 (SUTURE) ×2
SUT MNCRL 4-0 27XMFL (SUTURE) ×6
SUT MNCRL AB 4-0 PS2 18 (SUTURE) ×4 IMPLANT
SUT PDS AB 0 CT1 27 (SUTURE) ×7 IMPLANT
SUT SILK 2 0 SH (SUTURE) ×8 IMPLANT
SUT V-LOC 90 ABS 3-0 VLT  V-20 (SUTURE) ×1
SUT V-LOC 90 ABS 3-0 VLT V-20 (SUTURE) ×3 IMPLANT
SUT VIC AB 0 CT1 36 (SUTURE) ×8 IMPLANT
SUT VIC AB 2-0 SH 27 (SUTURE) ×1
SUT VIC AB 2-0 SH 27XBRD (SUTURE) IMPLANT
SUT VIC AB 3-0 SH 27 (SUTURE) ×1
SUT VIC AB 3-0 SH 27X BRD (SUTURE) IMPLANT
SUT VICRYL 0 AB UR-6 (SUTURE) ×4 IMPLANT
SUT VLOC 90 S/L VL9 GS22 (SUTURE) ×8 IMPLANT
SUTURE DVC VLC 90 3-0 CV23 VLT (SUTURE) ×3 IMPLANT
SUTURE EHLN 3-0 FS-10 30 BLK (SUTURE) IMPLANT
SUTURE MNCRL 4-0 27XMF (SUTURE) ×6 IMPLANT
SYR 10ML LL (SYRINGE) ×4 IMPLANT
SYR 20ML LL LF (SYRINGE) ×9 IMPLANT
SYR BULB IRRIG 60ML STRL (SYRINGE) ×4 IMPLANT
SYR TOOMEY 50ML (SYRINGE) ×4 IMPLANT
SYS TROCAR 1.5-3 SLV ABD GEL (ENDOMECHANICALS) ×4
SYSTEM TROCR 1.5-3 SLV ABD GEL (ENDOMECHANICALS) ×3 IMPLANT
TAPE CLOTH 3X10 WHT NS LF (GAUZE/BANDAGES/DRESSINGS) ×7 IMPLANT
TAPE TRANSPORE STRL 2 31045 (GAUZE/BANDAGES/DRESSINGS) ×3 IMPLANT
TRAY FOLEY MTR SLVR 16FR STAT (SET/KITS/TRAYS/PACK) ×3 IMPLANT
TRAY FOLEY SLVR 16FR LF STAT (SET/KITS/TRAYS/PACK) ×3 IMPLANT
TROCAR ADV FIXATION 12X100MM (TROCAR) ×3 IMPLANT
TROCAR BALLN GELPORT 12X130M (ENDOMECHANICALS) IMPLANT
TROCAR ENDOPATH XCEL 12X100 BL (ENDOMECHANICALS) ×3 IMPLANT
TROCAR XCEL 12X100 BLDLESS (ENDOMECHANICALS) ×4 IMPLANT
TROCAR XCEL NON-BLD 5MMX100MML (ENDOMECHANICALS) ×7 IMPLANT

## 2019-08-17 NOTE — Anesthesia Postprocedure Evaluation (Signed)
Anesthesia Post Note  Patient: Tony Hernandez  Procedure(s) Performed: XI ROBOT ASSISTED LAPAROSCOPIC PARTIAL COLECTOMY, Right (Right ) INDOCYANINE GREEN FLUORESCENCE IMAGING (ICG) XI ROBOTIC ASSISTED LAPAROSCOPIC PROSTATECTOMY, BILATERAL LYMPH NODE DISSECTION (Bilateral )  Patient location during evaluation: PACU Anesthesia Type: General Level of consciousness: awake and alert Pain management: pain level controlled Vital Signs Assessment: post-procedure vital signs reviewed and stable Respiratory status: spontaneous breathing, nonlabored ventilation, respiratory function stable and patient connected to nasal cannula oxygen Cardiovascular status: blood pressure returned to baseline and stable Postop Assessment: no apparent nausea or vomiting Anesthetic complications: no     Last Vitals:  Vitals:   08/17/19 1625 08/17/19 1711  BP:  (!) 144/84  Pulse: 95 93  Resp: 18 12  Temp:  36.9 C  SpO2: 97% 95%    Last Pain:  Vitals:   08/17/19 1717  TempSrc:   PainSc: 0-No pain                 Arita Miss

## 2019-08-17 NOTE — Anesthesia Preprocedure Evaluation (Addendum)
Anesthesia Evaluation  Patient identified by MRN, date of birth, ID band Patient awake    Reviewed: Allergy & Precautions, H&P , NPO status , Patient's Chart, lab work & pertinent test results  Airway Mallampati: II  TM Distance: >3 FB Neck ROM: full    Dental  (+) Edentulous Lower, Edentulous Upper   Pulmonary neg COPD, former smoker,    breath sounds clear to auscultation       Cardiovascular (-) angina(-) Past MI and (-) Cardiac Stents negative cardio ROS  (-) dysrhythmias  Rhythm:regular Rate:Normal     Neuro/Psych negative neurological ROS  negative psych ROS   GI/Hepatic Neg liver ROS, GERD  ,Colon mass   Endo/Other  Hypothyroidism   Renal/GU      Musculoskeletal   Abdominal   Peds  Hematology negative hematology ROS (+)   Anesthesia Other Findings Past Medical History: No date: Arthritis     Comment:  knees No date: GERD (gastroesophageal reflux disease) No date: Thyroid disease No date: Wears dentures  Past Surgical History: 2018: CHOLECYSTECTOMY 05/19/2019: COLONOSCOPY WITH PROPOFOL; N/A     Comment:  Procedure: COLONOSCOPY WITH PROPOFOL;  Surgeon: [name], MD;  Location: [place];  Service:               Endoscopy;  Laterality: N/A;  Priority 4 05/19/2019: POLYPECTOMY     Comment:  Procedure: POLYPECTOMY;  Surgeon: [name], MD;                Location: [place];  Service: Endoscopy;;     Reproductive/Obstetrics negative OB ROS                            Anesthesia Physical Anesthesia Plan  ASA: III  Anesthesia Plan: General ETT   Post-op Pain Management:    Induction:   PONV Risk Score and Plan: Ondansetron, Dexamethasone, Midazolam and Treatment may vary due to age or medical condition  Airway Management Planned:   Additional Equipment: Arterial line  Intra-op Plan:   Post-operative Plan:   Informed Consent:  I have reviewed the patients History and Physical, chart, labs and discussed the procedure including the risks, benefits and alternatives for the proposed anesthesia with the patient or authorized representative who has indicated his/her understanding and acceptance.     Dental Advisory Given  Plan Discussed with: Anesthesiologist, CRNA and Surgeon  Anesthesia Plan Comments:         Anesthesia Quick Evaluation

## 2019-08-17 NOTE — H&P (Signed)
Surgical Consultation  07/10/2019  Tony Hernandez is an 64 y.o. male.      Chief Complaint  Patient presents with  . New Patient (Initial Visit)    Colon polyp   HPI: 64 year old male seen in consultation at the request of Dr. Vicente Males. He recently underwent a screening colonoscopy and was found a large cecal polyp 20 mm in the cecum it was actually elevated and resected with a snare technique. He had a few other polyps in the right colon. 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 Pathologist states that the number of corresponding polyps is unclear. There was an additional rectal hyperplastic polyp  He does have significant family history for colorectal cancer, sister. He denies any hematochezia, no melena or weight loss. He is able to perform more than 4 METS of activity without any shortness of breath or chest pain. Only medications he takes is Synthroid and antihypertensives. He had a previous laparoscopic cholecystectomy that was uneventful.  I have personally reviewed the colonoscopy images. CBC and CMP from 3 months ago were completely normal.  He underwent recent MRI of the prostate and he will require prostatectomy. We have agreed upon doing combined procedure with urology.        Past Medical History:  Diagnosis Date  . Arthritis    knees  . GERD (gastroesophageal reflux disease)   . Thyroid disease   . Wears dentures         Past Surgical History:  Procedure Laterality Date  . CHOLECYSTECTOMY  2018  . COLONOSCOPY WITH PROPOFOL N/A 05/19/2019   Procedure: COLONOSCOPY WITH PROPOFOL; Surgeon: [name], MD; Location: [place]; Service: Endoscopy; Laterality: N/A; Priority 4  . POLYPECTOMY  05/19/2019   Procedure: POLYPECTOMY; Surgeon: [name], MD; Location: [place]; Service: Endoscopy;;        Family History  Problem Relation Age of Onset  . Lung  cancer Mother 72  . Diabetes Paternal Grandmother   . Colon cancer Sister 57   Social History: reports that he quit smoking about 18 years ago. His smoking use included cigarettes. He quit after 10.00 years of use. He has never used smokeless tobacco. He reports previous drug use. Drugs: Cocaine and Marijuana. He reports that he does not drink alcohol.  Allergies: No Known Allergies  Medications reviewed.  ROS  Full ROS performed and is otherwise negative other than what is stated in the HPI   Physical Exam  Vitals and nursing note reviewed. Exam conducted with a chaperone present.  Constitutional:  General: He is not in acute distress. Appearance: Normal appearance. He is not toxic-appearing.  Eyes:  General: No scleral icterus.  Right eye: No discharge.  Left eye: No discharge.  Cardiovascular:  Rate and Rhythm: Normal rate and regular rhythm.  Heart sounds: No murmur.  Pulmonary:  Effort: Pulmonary effort is normal. No respiratory distress.  Breath sounds: Normal breath sounds. No stridor. No rhonchi.  Abdominal:  General: Abdomen is flat. There is no distension.  Palpations: There is no mass.  Tenderness: There is no abdominal tenderness. There is no guarding or rebound.  Hernia: No hernia is present.  Musculoskeletal:  General: Normal range of motion.  Cervical back: Normal range of motion and neck supple. No rigidity or tenderness.  Lymphadenopathy:  Cervical: No cervical adenopathy.  Skin:  General: Skin is warm and dry.  Capillary Refill: Capillary refill takes less  than 2 seconds.  Neurological:  General: No focal deficit present.  Mental Status: He is alert and oriented to person, place, and time.  Psychiatric:  Mood and Affect: Mood normal.  Behavior: Behavior normal.  Thought Content: Thought content normal.  Judgment: Judgment normal.   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 Due to unknown margins and size, in addition to high-grade dysplasia there is a significant risk for residual carcinoma. I do recommend completion right hemicolectomy for that reason. I do think that he is a good candidate for robotic approach. Procedure discussed with the patient in detail. Risks, benefits and possible complications including but not limited to: Bleeding, infection, anastomotic leak, possible reintervention, chronic pain and hernias.  CT scan no evidence of metastatic disease We will perform  laparoscopic assisted robotic right hemicolectomy and prostatectomy combined procedure with urology.   Caroleen Hamman, MD [facility] General Surgeon

## 2019-08-17 NOTE — Transfer of Care (Signed)
Immediate Anesthesia Transfer of Care Note  Patient: Tony Hernandez  Procedure(s) Performed: XI ROBOT ASSISTED LAPAROSCOPIC PARTIAL COLECTOMY, Right (Right ) INDOCYANINE GREEN FLUORESCENCE IMAGING (ICG) XI ROBOTIC ASSISTED LAPAROSCOPIC PROSTATECTOMY, BILATERAL LYMPH NODE DISSECTION (Bilateral )  Patient Location: PACU  Anesthesia Type:General  Level of Consciousness: sedated  Airway & Oxygen Therapy: Patient Spontanous Breathing and Patient connected to face mask oxygen  Post-op Assessment: Report given to RN and Post -op Vital signs reviewed and stable  Post vital signs: Reviewed and stable  Last Vitals:  Vitals Value Taken Time  BP 149/88 08/17/19 1542  Temp 36.3 C 08/17/19 1542  Pulse 101 08/17/19 1542  Resp 17 08/17/19 1542  SpO2 100 % 08/17/19 1542  Vitals shown include unvalidated device data.  Last Pain:  Vitals:   08/17/19 1542  TempSrc:   PainSc: 0-No pain         Complications: No apparent anesthesia complications

## 2019-08-17 NOTE — Op Note (Signed)
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 Genevive Bi, required due to the complexity of the case, to allow appropiate exposure and anastomosis  Anesthesia: Gen. with endotracheal tube  Findings: Tension Free anastomosis w/o intra op leak Good perfusion of anastomosis confirmed by ICG NO evidence of metastatic disease   Estimated Blood Loss: 20 cc       Specimens: Colon         Complications: none   Procedure Details  The patient was seen again in the Holding Room. The benefits, complications, treatment options, and expected outcomes were discussed with the patient. The risks of bleeding, infection, recurrence of symptoms, failure to resolve symptoms, anastomotic leak,bowel injury, any of which could require further surgery  were reviewed with the patient. The likelihood of improving the patient's symptoms with return to their baseline status is good.  The patient and/or family concurred with the proposed plan, giving informed consent.  The patient was taken to Operating Room, identified  and the procedure verified,  A Time Out was held and the above information confirmed.  Prior to the induction of general anesthesia, antibiotic prophylaxis was administered. VTE prophylaxis was in place. General endotracheal anesthesia was then administered and tolerated well. After the induction, the abdomen was prepped with Chloraprep and draped in the sterile fashion. The patient was positioned in the lithotomy position given that this was a combined prostatectomy and colectomy. I place a foley catheter under sterile technique.  Cut down technique was used to enter the abdominal cavity in the Left side and a mini port was placed.  Pneumoperitoneum was then created with CO2 and tolerated well without any adverse changes in the patient's vital signs.  Three 8-mm ports were placed under direct vision. All skin incisions  were infiltrated with a local anesthetic agent before making the incision and placing the trocars. AN extra assist port was placed and we used Airseal system.  The patient was positioned  in  Trendelenburg, robot was brought to the surgical field and docked in the standard fashion.  We made sure all the instrumentation was kept indirect view at all times and that there were no collision between the arms. I scrubbed out and went to the console.   Attention then was turned to the right ileocolic pedicle, there were adhesions from the cecum to the pelvis and from the small bowel to the sigmoid. Lysis of adhesions performed sharply to restore appropriate anatomy..  The transverse colon  Was elevated and we visualized the right ileocolic pedicle. I  Was able to dissected circumferentially and using vessel sealer I was able to divide the pedicle in the standard fashion and I did that 3 times.  Attention then was turned to the medial aspect of the mesentery.  We identified the  duodenum.  Then we develop the extraperitoneal plane until we reach the hepatic flexure.  At this point the vessel sealer was used to dissect the colon from a lateral to medial perspective as well as the terminal ileum. I turned my attention to the ileocecal valve and extended my mesenteric defect towards the terminal ileum.  13mm robotic stapler was used to divide the TI approximately 10 cm from the ileocecal valve.  We made sure that we use ICG before dividing any bowel structures.  zThere was excellent perfusion on both segments of divided bowel. Our attention was turned to the transverse colon and to the right of the colic artery we created a window of within the transverse colon.  We were also able to mobilize the greater omentum so he tries columns  of ability.  Once we created a good window and the transverse colon was divided with a 60 mm stapler in the standard fashion.  TO gain more exposure and length I took the splenic flexure partially to have a good length.   The rest of the specimen was removed after dividing the mesentery with vessel sealer. I was able to create an side-to-side functional end-to-end stapled anastomosis. The common channel was closed  using first layer of simple 3 0 V-Loc suture followed by vertical conel suture. There was good perfusion  Of the anastomosis when we usied ICG and there was a tension-free anastomosis. There Was no evidence of any twisting.  Anastomosis was widely patent with good perfusion and tension free.  I removed all the needles under direct visualization.  At this point I remove the left lower quadrant port and place a 15 mm specimen bag.  The specimen extended to the back under direct visualization.  And undocked the robot and I scrubbed back in.   Case was turned over to Dr. Erlene Quan and she continued her case using some of my existing ports. She will dictate a separate operative report. Specimen was sent for permanent pathology.               Caroleen Hamman, MD, FACS

## 2019-08-17 NOTE — Progress Notes (Signed)
Called Dr. Windell Moment regarding patient request for medicine to get rid of phlegm. Appropriate orders were placed.  Will continue to monitor.  Christene Slates

## 2019-08-17 NOTE — Op Note (Signed)
08/17/19  PREOPERATIVE DIAGNOSIS: Prostate cancer.  POSTOPERATIVE DIAGNOSIS: Prostate cancer.  OPERATION PERFORMED: 1. DaVinci laparoscopic radical prostatectomy (bilateral nerve sparing) 2 DaVinci laproscopic bilateral pelvic lymph node dissection.  SURGEON: Hollice Espy, MD  ASSISTANTS: Ronalee Belts, MD  ANESTHESIA: General.  EBL: 150 cc  SPECIMEN: Prostate with bilateral seminal vesicals, bilateral pelvic lymph nodes, anterior fat pad.  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 Treatment options were discussed with him at length and he chose DaVinci radical prostatectomy.  Good baseline erections therefore plan for bilateral nerve sparing was discussed. Bilateral pelvic lymph node dissection was planned due to his risk stratification.  PROCEDURE IN DETAIL: Patient was given appropriate perioperative antibiotics. He had sequential compression devices applied preoperatively for DVT prophylaxis. He was taken to the operating room where he was induced with general anesthesia. After adequate anesthesia, he was placed in the dorsal lithotomy position. His arms were draped by his side and was appropriately padded and secured to the operating room Table.  The patient was secured to the table with arms tucked with care to pad all pressure points.  At this point in time, Dr. Zenda Alpers upon bone proceeded with port placement and robotic assisted laparoscopic right colectomy with his assistant Dr. Faith Rogue.  Prior to the procedure, we coordinated placement of ports such that some of the ports could be used for both procedures.  He did place a 9 French Foley catheter sterilely on the field at the beginning of the procedure.  Please see his operative report for details for this portion of the procedure.  Once his portion of the procedure was complete and the specimen had been already  been extracted, the patient was placed in Trendelenburg.  It was able to utilize his GelPort by placing 5 French trocar through this is a suction port as well as utilize the ports on the left lower abdomen and lateral abdomen.  I converted the lateralmost left-sided port to a 12 which was preclosed using a 0 Vicryl on a Carter-Thomason needle to be closed at the end of the procedure.  An additional 8 mm robotic trocar was placed just above the umbilicus and 2 additional 8 mm ports in the right midclavicular line parallel to the umbilicus and one in the mid axillary line.  The robot was then docked with the robot trocar. I used the zero-degree camera. I had the hot scissors in the right hand and the left hand with the [place] bipolar and far left hand the Prograsp forceps. Initially I divided the median umbilical ligament bilaterally and the urachus and developed the space of Retzius down to pubic bone. I divided the parietal peritoneum laterally up to the vas deferens on each side. I used the prograsp forceps to provide cranial traction on the urachus. I cleaned off the Endopelvic fascia on each side and then divided it with the scissors laterally to the perirectal fat and medially to the puboprostatic ligaments which were divided. I then ligated the dorsal vein complex using a 60 mm vascular load stapler.   I then addressed the bladder neck with a 30-degree down lens. I identified the bladder neck by pulling on the Foley catheter. I divided the anterior bladder neck musculature until I then found the anterior bladder neck mucosa which was incised. I identified the Foley catheter within, deflated the balloon, pulled the Foley out through this opening and then using the Carter-Thomason needle with a #0-Vicryl suture,  passed through The suprapubic region and pulled the suture through the eye of the Foley and then back out. This allowed me to provide upward traction on the prostate. I then  divided the lateral bladder neck mucosa and the posterior bladder neck mucosa. I was well away from ureteral orifices. I divided the posterior bladder neck musculature until I identified the vas deferens. They were freed proximally, then divided. I freed up the seminal vesicals using blunt and sharp dissection. Judicious use of electrocautery was used near the seminal vesicle tips to avoid injury to neurovascular bundle.   I then went back to the 0-degree lens. I divided the Denonvilliers fascia beneath the prostate and developed the prostate off the rectum. I then did a bilateral nerve sparing by  creating a plane which was close to the capsule. I then isolated the pedicles of the prostate and placed weck clips on the pedicles of the prostate and then divided it with cold scissors. I continued to divide the neurovascular bundles off the prostate out to the apex of the prostate. At this point the prostate was freed up except for the urethra. I addressed the prostate anteriorly, divided the dorsal vein , then the anterior urethral wall, pulled the Foley catheter back and then divided the posterior urethral wall. Specimen was completely freed up. I placed the prostate in an Endo catch bag and then placed the bag in the upper abdomen out of the way. I then irrigated the pelvis. The rectal test was negative. There was reasonable hemostasis.  I then did the pelvic lymph node dissection by incising the fascia overlying the right external iliac vein, dissecting distally. I went just distal to the node of [place] where we placed clips and then divided the lymphatics. The lateral aspect of the dissection was the pelvic side wall, inferior was the obturator nerve and proximal the hypogastric vessels. I placed clips at the proximal aspect and then divided the lymphatics. This was removed with the spoon grasper and sent to pathology.   I then did the left obturator lymph node dissection in  the same fashion as the left side.  Surgicel was placed into the bed of each of these dissection sites.  With good hemostasis, I then did the posterior reconstruction. I used a 3-0 VLoc suture through the cut edge of Denonvilliers fascia beneath the bladder on the right side and through the posterior striated sphincter underneath the urethra. This brought the bladder neck and urethra and closer proximity to help facilitate anastomosis.   I then did the urethral vesicle anastomosis again with two 3-0 VLoc sutures interlocked. I passed both ends of the suture from the outside-in through the bladder neck at the 6 o'clock position. I passed both through the urethral stump from the inside-out in the corresponding position. I reapproximated the bladder neck to the urethra. I then ran the Left suture on the left side anastomosis to the 9 o'clock position. Then I went back to the right sided suture and ran that up the right side to the 12 o'clock position. I then continued the left suture to the 12 o'clock position. The suture was then suspended anteriorly behind the pubic bone.   I then placed a new 4 French Foley into the bladder and filled it with 10 cc sterile water. I irrigated the bladder with 160 cc. There was no leakage. There was reasonable hemostasis.  Surgicel was used on either side of the pedicles for an additional hemostasis.  Surgi-Flo was  also used. The instruments were then removed. The robot was undocked and all the trocars were removed under direct vision. There was good Hemostasis.  I used the existing GelPort to extract all specimens.  All the port sites were irrigated.  A mixture of Marcaine and liposomal Marcaine was injected  into all the trocar sites.  The GelPort site was closed using a running 0 PDS suture as well as interrupted Vicryl to close the subcutaneous tissue.  The skin was closed with 4-0 Monocryl in running subcuticular fashion. Dermabond  was applied.   At this point patient was awakened and extubated in the operating room and taken to the recovery room in stable condition. There were no complications. All counts correct.  Hollice Espy, MD

## 2019-08-17 NOTE — Interval H&P Note (Signed)
Patient seen and examined  All questions answered  Plan for outlined procedures listed below  Regular rate and rhythm Clear to auscultation bilaterally  All questions answered

## 2019-08-17 NOTE — Anesthesia Procedure Notes (Signed)
Procedure Name: Intubation Date/Time: 08/17/2019 7:51 AM Performed by: Allean Found, CRNA Pre-anesthesia Checklist: Patient identified, Patient being monitored, Timeout performed, Emergency Drugs available and Suction available Patient Re-evaluated:Patient Re-evaluated prior to induction Oxygen Delivery Method: Circle system utilized Preoxygenation: Pre-oxygenation with 100% oxygen Induction Type: IV induction Ventilation: Mask ventilation without difficulty Laryngoscope Size: McGraph and 4 Grade View: Grade I Tube type: Oral Tube size: 7.5 mm Number of attempts: 1 Airway Equipment and Method: Stylet Placement Confirmation: ETT inserted through vocal cords under direct vision,  positive ETCO2 and breath sounds checked- equal and bilateral Secured at: 23 cm Tube secured with: Tape Dental Injury: Teeth and Oropharynx as per pre-operative assessment

## 2019-08-18 DIAGNOSIS — C61 Malignant neoplasm of prostate: Principal | ICD-10-CM

## 2019-08-18 LAB — CBC
HCT: 33.1 % — ABNORMAL LOW (ref 39.0–52.0)
Hemoglobin: 11.1 g/dL — ABNORMAL LOW (ref 13.0–17.0)
MCH: 24.9 pg — ABNORMAL LOW (ref 26.0–34.0)
MCHC: 33.5 g/dL (ref 30.0–36.0)
MCV: 74.4 fL — ABNORMAL LOW (ref 80.0–100.0)
Platelets: 272 10*3/uL (ref 150–400)
RBC: 4.45 MIL/uL (ref 4.22–5.81)
RDW: 14.5 % (ref 11.5–15.5)
WBC: 12.7 10*3/uL — ABNORMAL HIGH (ref 4.0–10.5)
nRBC: 0 % (ref 0.0–0.2)

## 2019-08-18 LAB — BASIC METABOLIC PANEL
Anion gap: 7 (ref 5–15)
BUN: 16 mg/dL (ref 8–23)
CO2: 24 mmol/L (ref 22–32)
Calcium: 7.8 mg/dL — ABNORMAL LOW (ref 8.9–10.3)
Chloride: 102 mmol/L (ref 98–111)
Creatinine, Ser: 0.99 mg/dL (ref 0.61–1.24)
GFR calc Af Amer: >60 mL/min (ref >60)
GFR calc non Af Amer: >60 mL/min (ref >60)
Glucose, Bld: 104 mg/dL — ABNORMAL HIGH (ref 70–99)
Potassium: 3.9 mmol/L (ref 3.5–5.1)
Sodium: 133 mmol/L — ABNORMAL LOW (ref 135–145)

## 2019-08-18 MED ORDER — OXYBUTYNIN CHLORIDE 5 MG PO TABS: 5.0000 mg | ORAL_TABLET | Freq: Three times a day (TID) | ORAL | Status: DC | PRN

## 2019-08-18 NOTE — Progress Notes (Signed)
Urology Inpatient Progress Note  Subjective: Tony Hernandez is a 64 y.o. male admitted on 08/17/2019 for scheduled robotic assisted laparoscopic radical prostatectomy, bilateral nerve sparing, with bilateral pelvic lymph node dissection with Dr. 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 Dahlia Byes for management of large ascending colon with high-grade dysplasia.   WBC down today, 12.7.  Hemoglobin and hematocrit down today, 11.1 and 33.1, respectively.  Creatinine down today, 0.99.  Foley catheter in place today draining pink urine.  Today, patient reports stable abdominal pressure since surgery.  He denies nausea or vomiting.  He has not yet ambulated.  He denies bladder pain or urinary urgency.  Anti-infectives: Anti-infectives (From admission, onward)   Start     Dose/Rate Route Frequency Ordered Stop   08/18/19 0600  cefoTEtan (CEFOTAN) 2 g in sodium chloride 0.9 % 100 mL IVPB     2 g 200 mL/hr over 30 Minutes Intravenous On call to O.R. 08/17/19 1700 08/19/19 0559   08/17/19 1130  cefoTEtan (CEFOTAN) 2 g in sodium chloride 0.9 % 100 mL IVPB  Status:  Discontinued     2 g 200 mL/hr over 30 Minutes Intravenous  Once 08/17/19 1122 08/17/19 1655   08/17/19 0629  sodium chloride 0.9 % with cefoTEtan (CEFOTAN) ADS Med    Note to Pharmacy: Trudie Reed   : cabinet override      08/17/19 0629 08/17/19 0756   08/17/19 0600  cefoTEtan (CEFOTAN) 2 g in sodium chloride 0.9 % 100 mL IVPB     2 g 200 mL/hr over 30 Minutes Intravenous On call to O.R. 08/16/19 2211 08/17/19 0816      Current Facility-Administered Medications  Medication Dose Route Frequency Provider Last Rate Last Admin  . 0.9 %  sodium chloride infusion   Intravenous Continuous Jules Husbands, MD 75 mL/hr at 08/18/19 0607 New Bag at 08/18/19 [id number]  . acetaminophen (TYLENOL) tablet 1,000 mg  1,000 mg Oral Q6H  Pabon, Diego F, MD   1,000 mg at 08/18/19 0607  . alvimopan (ENTEREG) capsule 12 mg  12 mg Oral On Call to OR Pabon, [place] F, MD      . cefoTEtan (CEFOTAN) 2 g in sodium chloride 0.9 % 100 mL IVPB  2 g Intravenous On Call to [place], Craig, MD      . Chlorhexidine Gluconate Cloth 2 % PADS 6 each  6 each Topical Daily Jules Husbands, MD   6 each at 08/18/19 0039  . diphenhydrAMINE (BENADRYL) 12.5 MG/5ML elixir 12.5 mg  12.5 mg Oral Q6H PRN Pabon, Diego F, MD       Or  . diphenhydrAMINE (BENADRYL) injection 12.5 mg  12.5 mg Intravenous Q6H PRN Pabon, Diego F, MD      . enoxaparin (LOVENOX) injection 40 mg  40 mg Subcutaneous Q24H Pabon, Diego F, MD      . guaiFENesin (ROBITUSSIN) 100 MG/5ML solution 100 mg  5 mL Oral Q4H PRN Herbert Pun, MD   100 mg at 08/18/19 0612  . ketorolac (TORADOL) 30 MG/ML injection 30 mg  30 mg Intravenous Q6H Pabon, Diego F, MD   30 mg at 08/18/19 0607  . levothyroxine (SYNTHROID) tablet 150 mcg  150 mcg Oral Daily Pabon, [place] F, MD   150 mcg at 08/18/19 0608  . morphine 2 MG/ML injection 2 mg  2 mg Intravenous Q3H PRN Jules Husbands, MD      .  ondansetron (ZOFRAN-ODT) disintegrating tablet 4 mg  4 mg Oral Q6H PRN Pabon, Diego F, MD       Or  . ondansetron (ZOFRAN) injection 4 mg  4 mg Intravenous Q6H PRN Pabon, Diego F, MD      . oxyCODONE (Oxy IR/ROXICODONE) immediate release tablet 5-10 mg  5-10 mg Oral Q4H PRN Pabon, Diego F, MD      . prochlorperazine (COMPAZINE) tablet 10 mg  10 mg Oral Q6H PRN Pabon, Diego F, MD       Or  . prochlorperazine (COMPAZINE) injection 5-10 mg  5-10 mg Intravenous Q6H PRN Pabon, Diego F, MD       Objective: Vital signs in last 24 hours: Temp:  [97.3 F (36.3 C)-99.6 F (37.6 C)] 98.7 F (37.1 C) (05/28 0518) Pulse Rate:  [87-101] 89 (05/28 0518) Resp:  [12-22] 16 (05/28 0518) BP: (123-163)/(84-96) 146/86 (05/28 0518) SpO2:  [95 %-100 %] 97 % (05/28 0518) Arterial Line BP: (182-204)/(82-102) 191/82 (05/27  1625) Weight:  [79.4 kg] 79.4 kg (05/27 1755)  Intake/Output from previous day: 05/27 0701 - 05/28 0700 In: 3694.1 [P.O.:40; I.V.:3654.1] Out: 1450 [Urine:1300; Blood:150] Intake/Output this shift: No intake/output data recorded.  Physical Exam Vitals and nursing note reviewed.  Constitutional:      General: He is not in acute distress.    Appearance: He is not ill-appearing, toxic-appearing or diaphoretic.  HENT:     Head: Normocephalic and atraumatic.  Abdominal:     Palpations: Abdomen is soft.     Tenderness: There is no guarding or rebound.     Comments: Abdominal incisions visualized over the anterior abdomen, all clean, dry, and intact with overlying surgical adhesive.  Skin:    General: Skin is warm and dry.  Neurological:     Mental Status: He is alert and oriented to person, place, and time.  Psychiatric:        Mood and Affect: Mood normal.        Behavior: Behavior normal.    Lab Results:  Recent Labs    08/17/19 1725 08/18/19 0411  WBC 17.0* 12.7*  HGB 11.7* 11.1*  HCT 36.8* 33.1*  PLT 298 272   BMET Recent Labs    08/15/19 1038 08/15/19 1038 08/17/19 1725 08/18/19 0411  NA 139  --   --  133*  K 3.8  --   --  3.9  CL 108  --   --  102  CO2 25  --   --  24  GLUCOSE 97  --   --  104*  BUN 10  --   --  16  CREATININE 0.84   < > 1.24 0.99  CALCIUM 9.2  --   --  7.8*   < > = values in this interval not displayed.   PT/INR Recent Labs    08/15/19 1038  LABPROT 11.9  INR 0.9   Assessment & Plan: 64 year old male POD 1 from scheduled combined robotic assisted laparoscopic radical prostatectomy with bilateral pelvic lymph node dissection by urology as well as robotic assisted laparoscopic right colectomy with splenic flexure takedown with general surgery.  Patient recovering well with normal postoperative lab changes today.  Okay to discharge from a urologic perspective once he has advanced diet and ambulated.  Recommendations: -Keep Foley  catheter, urology to remove in 1 week (scheduled) -Oxybutynin 5 mg every 8 hours as needed for management of bladder spasms  Debroah Loop, PA-C 08/18/2019

## 2019-08-18 NOTE — Progress Notes (Signed)
POD # 1 s/p Robotic Right Colectomy and prostatectomy  avss Good UO Taking CLD  PE NAD Abd: soft, decrease bs, incisions c/d/i, no peritonitis  A/p Doing well Fulls today Keep foley 7 days Mobilize Once starting flatus will stop entereg and advance to regular/soft diet

## 2019-08-18 NOTE — Plan of Care (Signed)
Continuing with plan of care. 

## 2019-08-19 MED ORDER — KETOROLAC TROMETHAMINE 30 MG/ML IJ SOLN
30.0000 mg | Freq: Four times a day (QID) | INTRAMUSCULAR | Status: AC
  Administered 2019-08-20 – 2019-08-22 (×10): 30 mg via INTRAVENOUS
  Filled 2019-08-19 (×10): qty 1

## 2019-08-19 MED ORDER — ACETAMINOPHEN 500 MG PO TABS
1000.0000 mg | ORAL_TABLET | Freq: Four times a day (QID) | ORAL | Status: DC
  Administered 2019-08-19 – 2019-08-24 (×4): 1000 mg via ORAL
  Filled 2019-08-19 (×10): qty 2

## 2019-08-19 NOTE — Progress Notes (Signed)
08/19/2019  Subjective: Patient is 2 Days Post-Op robotic assisted right colectomy as well as radical prostatectomy.  No acute events overnight.  Patient reports that he had a bowel movement this morning and is having flatus.  Denies any worsening pain but does report still feeling a little bit distended.  Vital signs: Temp:  [98.7 F (37.1 C)-99.2 F (37.3 C)] 99.2 F (37.3 C) (05/29 0508) Pulse Rate:  [92-104] 92 (05/29 0530) Resp:  [14-20] 20 (05/29 0508) BP: (138-161)/(90-100) 161/92 (05/29 0508) SpO2:  [97 %-99 %] 97 % (05/29 0508)   Intake/Output: 05/28 0701 - 05/29 0700 In: 1765.3 [P.O.:240; I.V.:1525.3] Out: 900 [Urine:900] Last BM Date: 08/17/19  Physical Exam: Constitutional: No acute distress Abdomen: Soft, mildly distended, appropriately tender to palpation.  Incisions are clean dry and intact and healing well.  Labs:  Recent Labs    08/17/19 1725 08/18/19 0411  WBC 17.0* 12.7*  HGB 11.7* 11.1*  HCT 36.8* 33.1*  PLT 298 272   Recent Labs    08/17/19 1725 08/18/19 0411  NA  --  133*  K  --  3.9  CL  --  102  CO2  --  24  GLUCOSE  --  104*  BUN  --  16  CREATININE 1.24 0.99  CALCIUM  --  7.8*   No results for input(s): LABPROT, INR in the last 72 hours.  Imaging: No results found.  Assessment/Plan: This is a 64 y.o. male s/p robotic assisted right colectomy as well as radical prostatectomy.  -Patient currently has been tolerating a full liquid diet.  Given that he still will be distended although he is having return of bowel function, will wait until tonight to start him on a soft diet.  If he does well with a soft diet we could potentially discharge him home tomorrow. -Continue Foley catheter. -Continue ambulation   Melvyn Neth, [place]

## 2019-08-19 NOTE — Plan of Care (Signed)
In am pt reported that he feels bloated, but declined an intervention.  Denies pain.  Did not eat today breakfast or lunch.  Drank water.  Vomited once 432ml of clear emesis.  Decline nausea med.  Reports that he feels better.  Had liquid BM x2 , brown-green.

## 2019-08-20 ENCOUNTER — Inpatient Hospital Stay: Payer: 59

## 2019-08-20 LAB — BASIC METABOLIC PANEL
Anion gap: 9 (ref 5–15)
BUN: 18 mg/dL (ref 8–23)
CO2: 25 mmol/L (ref 22–32)
Calcium: 8.7 mg/dL — ABNORMAL LOW (ref 8.9–10.3)
Chloride: 106 mmol/L (ref 98–111)
Creatinine, Ser: 0.82 mg/dL (ref 0.61–1.24)
GFR calc Af Amer: >60 mL/min (ref >60)
GFR calc non Af Amer: >60 mL/min (ref >60)
Glucose, Bld: 102 mg/dL — ABNORMAL HIGH (ref 70–99)
Potassium: 3.5 mmol/L (ref 3.5–5.1)
Sodium: 140 mmol/L (ref 135–145)

## 2019-08-20 MED ORDER — KCL IN DEXTROSE-NACL 20-5-0.45 MEQ/L-%-% IV SOLN
INTRAVENOUS | Status: AC
  Filled 2019-08-20 (×7): qty 1000

## 2019-08-20 MED ORDER — LACTATED RINGERS IV SOLN: INTRAVENOUS | Status: DC

## 2019-08-20 NOTE — Progress Notes (Signed)
08/20/2019  Subjective: Patient is 3 Days Post-Op status post combined robotic assisted right colectomy and radical prostatectomy.  The patient started having more bowel movements yesterday his diet was advanced to a soft diet last night.  However the patient had episode of emesis overnight and still feels distended this morning.  Denies any worsening pain.  Vital signs: Temp:  [98.9 F (37.2 C)-99.8 F (37.7 C)] 99.4 F (37.4 C) (05/30 1213) Pulse Rate:  [85-96] 85 (05/30 1213) Resp:  [20] 20 (05/30 1213) BP: (155-172)/(88-94) 160/94 (05/30 1213) SpO2:  [97 %-99 %] 99 % (05/30 1213)   Intake/Output: 05/29 0701 - 05/30 0700 In: 663.2 [I.V.:663.2] Out: 900 [Urine:900] Last BM Date: 08/20/19  Physical Exam: Constitutional: No acute distress Abdomen: Soft, distended with tympany, appropriately tender to palpation over the incisions.  Incisions are clean dry and intact with no evidence of infection.  Labs:  Recent Labs    08/17/19 1725 08/18/19 0411  WBC 17.0* 12.7*  HGB 11.7* 11.1*  HCT 36.8* 33.1*  PLT 298 272   Recent Labs    08/18/19 0411 08/20/19 1006  NA 133* 140  K 3.9 3.5  CL 102 106  CO2 24 25  GLUCOSE 104* 102*  BUN 16 18  CREATININE 0.99 0.82  CALCIUM 7.8* 8.7*   No results for input(s): LABPROT, INR in the last 72 hours.  Imaging: DG Abd 1 View  Result Date: 08/20/2019 CLINICAL DATA:  Postoperative nausea and vomiting following partial colectomy and prostatectomy EXAM: ABDOMEN - 1 VIEW COMPARISON:  CT abdomen and pelvis Jul 31, 2019 FINDINGS: There are multiple loops of dilated small bowel without appreciable air-fluid levels. No free air. There is bibasilar atelectasis. There are surgical clips in the right upper quadrant. IMPRESSION: Multiple loops of dilated bowel. Question postoperative ileus versus developing bowel obstruction. Bowel obstruction is not excluded in this circumstance. No free air. Surgical clips right upper quadrant. Electronically  Signed   By: Lowella Grip III M.D.   On: 08/20/2019 09:37    Assessment/Plan: This is a 64 y.o. male s/p robotic assisted right colectomy and radical prostatectomy.  -Patient developed a postoperative ileus as seen on KUB today with multiple dilated loops of small bowel.  I have backed him down to clear liquid diet.  However if he has further emesis, he may require to be n.p.o. with NG tube placement.  Restarted IV fluid hydration.   --Continue Foley catheter.   Melvyn Neth, [facility]

## 2019-08-20 NOTE — Progress Notes (Signed)
Order received from Dr Hampton Abbot for clear liquid diet and LR at 50 ml/hr

## 2019-08-21 LAB — BASIC METABOLIC PANEL
Anion gap: 8 (ref 5–15)
BUN: 24 mg/dL — ABNORMAL HIGH (ref 8–23)
CO2: 27 mmol/L (ref 22–32)
Calcium: 8.8 mg/dL — ABNORMAL LOW (ref 8.9–10.3)
Chloride: 107 mmol/L (ref 98–111)
Creatinine, Ser: 0.92 mg/dL (ref 0.61–1.24)
GFR calc Af Amer: >60 mL/min (ref >60)
GFR calc non Af Amer: >60 mL/min (ref >60)
Glucose, Bld: 113 mg/dL — ABNORMAL HIGH (ref 70–99)
Potassium: 3.9 mmol/L (ref 3.5–5.1)
Sodium: 142 mmol/L (ref 135–145)

## 2019-08-21 LAB — MAGNESIUM: Magnesium: 2.1 mg/dL (ref 1.7–2.4)

## 2019-08-21 NOTE — Progress Notes (Signed)
08/21/2019  Subjective: Patient is 4 Days Post-Op status post combined robotic assisted right colectomy and radical prostatectomy. Due to n/v, diet dropped back to clears.  This AM, his RN reported clear drainage from right lateral port site.   Vital signs: Temp:  [98.5 F (36.9 C)-99.4 F (37.4 C)] 98.5 F (36.9 C) (05/31 0412) Pulse Rate:  [71-86] 71 (05/31 0412) Resp:  [20] 20 (05/31 0412) BP: (154-160)/(84-94) 154/92 (05/31 0412) SpO2:  [97 %-99 %] 99 % (05/31 0412)   Intake/Output: 05/30 0701 - 05/31 0700 In: 1181.3 [P.O.:240; I.V.:941.3] Out: 825 [Urine:675; Emesis/NG output:150] Last BM Date: 08/20/19  Physical Exam: Constitutional: No acute distress Abdomen: Soft, mildly distended, appropriately tender to palpation over the incisions.  Incisions are clean dry and intact with no evidence of infection. No drainage seen.  Labs:  No results for input(s): WBC, HGB, HCT, PLT in the last 72 hours. Recent Labs    08/20/19 1006 08/21/19 0434  NA 140 142  K 3.5 3.9  CL 106 107  CO2 25 27  GLUCOSE 102* 113*  BUN 18 24*  CREATININE 0.82 0.92  CALCIUM 8.7* 8.8*   No results for input(s): LABPROT, INR in the last 72 hours.  Imaging: No results found.  Assessment/Plan: This is a 64 y.o. male s/p robotic assisted right colectomy and radical prostatectomy.  -Patient developed a postoperative ileus. Still experiencing nausea without further emesis.  Continue clears for now.  However if he has further emesis, he may require to be n.p.o. with NG tube placement. Continue IVF.  --Continue Foley catheter.

## 2019-08-21 NOTE — Progress Notes (Signed)
Dr Celine Ahr was informed that the patient continues to have nausea. Not vomiting. Dr Celine Ahr suggested that nursing use compazine

## 2019-08-22 LAB — CBC
HCT: 35.3 % — ABNORMAL LOW (ref 39.0–52.0)
Hemoglobin: 11.1 g/dL — ABNORMAL LOW (ref 13.0–17.0)
MCH: 24.3 pg — ABNORMAL LOW (ref 26.0–34.0)
MCHC: 31.4 g/dL (ref 30.0–36.0)
MCV: 77.2 fL — ABNORMAL LOW (ref 80.0–100.0)
Platelets: 370 10*3/uL (ref 150–400)
RBC: 4.57 MIL/uL (ref 4.22–5.81)
RDW: 14.3 % (ref 11.5–15.5)
WBC: 7.6 10*3/uL (ref 4.0–10.5)
nRBC: 0 % (ref 0.0–0.2)

## 2019-08-22 LAB — BASIC METABOLIC PANEL
Anion gap: 8 (ref 5–15)
BUN: 26 mg/dL — ABNORMAL HIGH (ref 8–23)
CO2: 25 mmol/L (ref 22–32)
Calcium: 8.5 mg/dL — ABNORMAL LOW (ref 8.9–10.3)
Chloride: 108 mmol/L (ref 98–111)
Creatinine, Ser: 1.12 mg/dL (ref 0.61–1.24)
GFR calc Af Amer: >60 mL/min (ref >60)
GFR calc non Af Amer: >60 mL/min (ref >60)
Glucose, Bld: 124 mg/dL — ABNORMAL HIGH (ref 70–99)
Potassium: 4.5 mmol/L (ref 3.5–5.1)
Sodium: 141 mmol/L (ref 135–145)

## 2019-08-22 LAB — MAGNESIUM: Magnesium: 1.8 mg/dL (ref 1.7–2.4)

## 2019-08-22 LAB — PHOSPHORUS: Phosphorus: 3 mg/dL (ref 2.5–4.6)

## 2019-08-22 NOTE — Progress Notes (Signed)
Urology Consult Follow Up  Subjective: Continues to struggle with postoperative ileus with abdominal distention and nausea but no vomiting.  No BM in several days.  No other complaints.  Ambulating.    Anti-infectives: Anti-infectives (From admission, onward)   Start     Dose/Rate Route Frequency Ordered Stop   08/18/19 0600  cefoTEtan (CEFOTAN) 2 g in sodium chloride 0.9 % 100 mL IVPB     2 g 200 mL/hr over 30 Minutes Intravenous On call to O.R. 08/17/19 1700 08/19/19 0559   08/17/19 1130  cefoTEtan (CEFOTAN) 2 g in sodium chloride 0.9 % 100 mL IVPB  Status:  Discontinued     2 g 200 mL/hr over 30 Minutes Intravenous  Once 08/17/19 1122 08/17/19 1655   08/17/19 0629  sodium chloride 0.9 % with cefoTEtan (CEFOTAN) ADS Med    Note to Pharmacy: Trudie Reed   : cabinet override      08/17/19 0629 08/17/19 0756   08/17/19 0600  cefoTEtan (CEFOTAN) 2 g in sodium chloride 0.9 % 100 mL IVPB     2 g 200 mL/hr over 30 Minutes Intravenous On call to O.R. 08/16/19 2211 08/17/19 0816      Current Facility-Administered Medications  Medication Dose Route Frequency Provider Last Rate Last Admin  . acetaminophen (TYLENOL) tablet 1,000 mg  1,000 mg Oral Q6H Pabon, Diego F, MD   1,000 mg at 08/21/19 0212  . Chlorhexidine Gluconate Cloth 2 % PADS 6 each  6 each Topical Daily Jules Husbands, MD   6 each at 08/21/19 1859  . dextrose 5 % and 0.45 % NaCl with KCl 20 mEq/L infusion   Intravenous Continuous Piscoya, Jose, MD 75 mL/hr at 08/22/19 0600 Rate Verify at 08/22/19 0600  . diphenhydrAMINE (BENADRYL) 12.5 MG/5ML elixir 12.5 mg  12.5 mg Oral Q6H PRN Pabon, Diego F, MD       Or  . diphenhydrAMINE (BENADRYL) injection 12.5 mg  12.5 mg Intravenous Q6H PRN Pabon, Diego F, MD      . enoxaparin (LOVENOX) injection 40 mg  40 mg Subcutaneous Q24H Jules Husbands, MD   Stopped at 08/22/19 0729  . guaiFENesin (ROBITUSSIN) 100 MG/5ML solution 100 mg  5 mL Oral Q4H PRN Herbert Pun, MD   100 mg at 08/18/19  0612  . ketorolac (TORADOL) 30 MG/ML injection 30 mg  30 mg Intravenous Q6H Pabon, Diego F, MD   30 mg at 08/22/19 0834  . levothyroxine (SYNTHROID) tablet 150 mcg  150 mcg Oral Daily Pabon, [place] F, MD   150 mcg at 08/22/19 0609  . morphine 2 MG/ML injection 2 mg  2 mg Intravenous Q3H PRN Jules Husbands, MD   2 mg at 08/21/19 1903  . ondansetron (ZOFRAN-ODT) disintegrating tablet 4 mg  4 mg Oral Q6H PRN Pabon, Diego F, MD       Or  . ondansetron (ZOFRAN) injection 4 mg  4 mg Intravenous Q6H PRN Jules Husbands, MD   4 mg at 08/22/19 0233  . oxybutynin (DITROPAN) tablet 5 mg  5 mg Oral Q8H PRN Vaillancourt, Samantha, PA-C      . oxyCODONE (Oxy IR/ROXICODONE) immediate release tablet 5-10 mg  5-10 mg Oral Q4H PRN Pabon, Diego F, MD   5 mg at 08/18/19 1205  . prochlorperazine (COMPAZINE) tablet 10 mg  10 mg Oral Q6H PRN Pabon, Diego F, MD       Or  . prochlorperazine (COMPAZINE) injection 5-10 mg  5-10 mg Intravenous Q6H PRN  Pabon, [place] F, MD   5 mg at 08/22/19 [id number]     Objective: Vital signs in last 24 hours: Temp:  [98.3 F (36.8 C)-99.1 F (37.3 C)] 98.3 F (36.8 C) (06/01 0439) Pulse Rate:  [72-86] 78 (06/01 0439) Resp:  [16-20] 16 (06/01 0516) BP: (127-148)/(76-78) 127/76 (06/01 0439) SpO2:  [98 %-100 %] 99 % (06/01 0439)  Intake/Output from previous day: 05/31 0701 - 06/01 0700 In: 1842.6 [I.V.:1842.6] Out: 850 [Urine:850] Intake/Output this shift: No intake/output data recorded.   Physical Exam  No acute distress Abdomen soft, mildly distended and tympanic without rebound or guarding.  Scant clear drainage from right lateral port site. Foley catheter in place urine clear yellow urine  Lab Results:  Recent Labs    08/22/19 0417  WBC 7.6  HGB 11.1*  HCT 35.3*  PLT 370   BMET Recent Labs    08/21/19 0434 08/22/19 0417  NA 142 141  K 3.9 4.5  CL 107 108  CO2 27 25  GLUCOSE 113* 124*  BUN 24* 26*  CREATININE 0.92 1.12  CALCIUM 8.8* 8.5*   PT/INR No  results for input(s): LABPROT, INR in the last 72 hours. ABG No results for input(s): PHART, HCO3 in the last 72 hours.  Invalid input(s): PCO2, PO2  Studies/Results: No results found.   Assessment: s/p Procedure(s): XI ROBOT ASSISTED LAPAROSCOPIC PARTIAL COLECTOMY, Right XI ROBOTIC ASSISTED LAPAROSCOPIC PROSTATECTOMY, BILATERAL LYMPH NODE DISSECTION INDOCYANINE GREEN FLUORESCENCE IMAGING (ICG)  Plan: -Ileus management by general surgery -Maintain Foley catheter -We will reassess patient on Thursday whether or not to proceed with voiding trial if he still in house     LOS: 5 days    Hollice Espy 08/22/2019

## 2019-08-22 NOTE — Progress Notes (Signed)
POD # 5 Ileus persist No vomiting No BM or flatus Ambulates AVSS   PE NAD Abd: soft but distended, decrease bs, incision c/d/i, no infection or peritonitis  A/P Persistent ileus  No surgical intervention May need TPN and picc if no improvement

## 2019-08-23 ENCOUNTER — Inpatient Hospital Stay: Payer: Self-pay

## 2019-08-23 ENCOUNTER — Inpatient Hospital Stay: Payer: 59

## 2019-08-23 LAB — HEMOGLOBIN A1C
Hgb A1c MFr Bld: 6.1 % — ABNORMAL HIGH (ref 4.8–5.6)
Mean Plasma Glucose: 128.37 mg/dL

## 2019-08-23 LAB — SURGICAL PATHOLOGY

## 2019-08-23 MED ORDER — TRACE MINERALS CU-MN-SE-ZN 300-55-60-3000 MCG/ML IV SOLN
INTRAVENOUS | Status: AC
  Filled 2019-08-23: qty 960

## 2019-08-23 MED ORDER — FAT EMULSION PLANT BASED 20 % IV EMUL
250.0000 mL | INTRAVENOUS | Status: AC
  Administered 2019-08-23: 250 mL via INTRAVENOUS
  Filled 2019-08-23: qty 250

## 2019-08-23 MED ORDER — KCL IN DEXTROSE-NACL 20-5-0.45 MEQ/L-%-% IV SOLN
INTRAVENOUS | Status: AC
  Filled 2019-08-23 (×2): qty 1000

## 2019-08-23 MED ORDER — SODIUM CHLORIDE 0.9% FLUSH
10.0000 mL | Freq: Two times a day (BID) | INTRAVENOUS | Status: DC
  Administered 2019-08-23 – 2019-08-24 (×2): 20 mL
  Administered 2019-08-25 – 2019-08-30 (×6): 10 mL

## 2019-08-23 MED ORDER — SODIUM CHLORIDE 0.9% FLUSH: 10.0000 mL | INTRAVENOUS | Status: DC | PRN

## 2019-08-23 MED ORDER — INSULIN ASPART 100 UNIT/ML ~~LOC~~ SOLN
0.0000 [IU] | Freq: Four times a day (QID) | SUBCUTANEOUS | Status: DC
  Administered 2019-08-24: 2 [IU] via SUBCUTANEOUS
  Administered 2019-08-24: 1 [IU] via SUBCUTANEOUS
  Administered 2019-08-25: 2 [IU] via SUBCUTANEOUS
  Administered 2019-08-25: 1 [IU] via SUBCUTANEOUS
  Administered 2019-08-26: 2 [IU] via SUBCUTANEOUS
  Administered 2019-08-27 – 2019-08-29 (×7): 1 [IU] via SUBCUTANEOUS
  Filled 2019-08-23 (×12): qty 1

## 2019-08-23 NOTE — Progress Notes (Signed)
Initial Nutrition Assessment  DOCUMENTATION CODES:   Not applicable  INTERVENTION:  Once PICC placed and confirmed initiate Clinimix E 5/20 at 40 mL/hr x 24 hrs + 20% ILE at 20 mL/hr x 12 hrs.   After 24 hours if electrolytes, CBGs, and triglycerides are within acceptable range advance to Clinimix E 5/20 at 83 mL/hr x 24 hrs + 20% ILE at 20 mL/hr x 12 hrs. Provides 2233 kcal, 100 grams of protein, 1992 mL fluid daily from Clinimix.  Provide adult MVI and trace elements as daily TPN additives.  NUTRITION DIAGNOSIS:   Inadequate oral intake related to acute illness(post-operative ileus) as evidenced by (clear liquid diet, meal completion <50%).  GOAL:   Patient will meet greater than or equal to 90% of their needs  MONITOR:   PO intake, Diet advancement, Labs, Weight trends, I & O's  REASON FOR ASSESSMENT:   Consult New TPN/TNA  ASSESSMENT:   64 year old male with PMHx of GERD, arthritis s/p robotic assisted laparoscopic right hemicolectomy and radical prostatectomy with pelvic lymph node dissection for prostate cancer on 5/27, now with post-operative ileus.   Met with patient at bedside. He reports he has not been able to eat well since 5/26. He reports he has a good appetite and intake at baseline and typically eats 3 good meals per day. Since admission patient reports he has not been able to eat well. He is struggling with nausea, occasional emesis, and distention. He had emesis this morning after breakfast. Patient reports he is only taking in small sips of water and lemonade.  Patient reports his UBW is 165 lbs. According to chart he was 79.4 kg (175.05 lbs) on 08/17/2019. RD obtained bed scale weight of 78.8 kg today.   Medications reviewed and include: levothyroxine, D5-1/2NS with KCl 20 mEq/L at 75 mL/hr.  Labs reviewed: BUN 26. Patient has been on D5-1/2 NS since 5/30 and electrolytes are stable.  IV Access: order in for PICC placement today  Patient does not meet  criteria for malnutrition at this time but is at risk for malnutrition.  Discussed with Pharmacy and Surgery PA. Plan is for initiation of tube feeds today. Total fluid volume of 100 mL/hr including TPN.  NUTRITION - FOCUSED PHYSICAL EXAM:    Most Recent Value  Orbital Region  No depletion  Upper Arm Region  No depletion  Thoracic and Lumbar Region  No depletion  Buccal Region  No depletion  Temple Region  Mild depletion  Clavicle Bone Region  No depletion  Clavicle and Acromion Bone Region  No depletion  Scapular Bone Region  No depletion  Dorsal Hand  No depletion  Patellar Region  No depletion  Anterior Thigh Region  No depletion  Posterior Calf Region  No depletion  Edema (RD Assessment)  None  Hair  Reviewed  Eyes  Reviewed  Mouth  Reviewed  Skin  Reviewed  Nails  Reviewed     Diet Order:   Diet Order            Diet clear liquid Room service appropriate? Yes; Fluid consistency: Thin  Diet effective now             EDUCATION NEEDS:   No education needs have been identified at this time  Skin:  Skin Assessment: Reviewed RN Assessment  Last BM:  08/20/2019  Height:   Ht Readings from Last 1 Encounters:  08/17/19 '5\' 5"'  (1.651 m)   Weight:   Wt Readings from Last 1  Encounters:  08/23/19 78.8 kg   BMI:  Body mass index is 28.91 kg/m.  Estimated Nutritional Needs:   Kcal:  2000-2300  Protein:  100-115 grams  Fluid:  >/= 2.3 L/day  Jacklynn Barnacle, MS, RD, LDN Pager number available on Amion

## 2019-08-23 NOTE — Consult Note (Signed)
PHARMACY - TOTAL PARENTERAL NUTRITION CONSULT NOTE   Indication: High Risk for Malnutrition  Patient Measurements: Height: 5\' 5"  (165.1 cm) Weight: 79.4 kg (175 lb 0.7 oz) IBW/kg (Calculated) : 61.5 TPN AdjBW (KG): 66 Body mass index is 29.13 kg/m.  Assessment: 64 y.o. male with persistent post-operative ileus awaiting return of bowel function 6 Days Post-Op s/p robotic assisted laparoscopic right hemicolectomy and radical prostatectomy with pelvic lymph node dissection for prostate cancer  Glucose / Insulin: no h/o DM, BG<180 Electrolytes: WNL Renal: Scr 1.24-->0.82-->1.12 LFTs / TGs: next draw 6/3 Prealbumin / albumin: next draw 6/3 Intake / Output; MIVF: D5 & 0.45% NaCl w/ 20 mEq KCl/L GI Imaging: no recent Surgeries / Procedures: none  Central access: 08/23/19 (pending) TPN start date: 08/23/19 (pending central line placement)  Nutritional Goals (per RD recommendation on 08/23/19): kCal/day: 2000 - 3000, Protein: 100-115 g/day, Fluid: >2300 mL/day Goal TPN rate is 83 mL/hr (provides 100 g of protein and 2233 kcals per day)  Current Nutrition:  Clear liquids  Plan:   Start E 5/20 TPN at 85mL/hr at 1800 + 20% ILE at 20 mL/hr x 12 hrs  Electrolytes in TPN: 38mEq/L of Na, 40mEq/L of K, 4mEq/L of Ca, 8mEq/L of Mg, and 1mmol/L of Phos. Cl:Ac ratio 1:1  Add standard MVI and trace elements to TPN  Initiate Sensitive q6h SSI and adjust as needed   Reduce MIVF to 60 mL/hr at 1800  Monitor TPN labs on Mon/Thurs and more often if clinically appropriate  Dallie Piles 08/23/2019,10:29 AM

## 2019-08-23 NOTE — Progress Notes (Addendum)
[facility] Day(s): 6.   Post op day(s): 6 Days Post-Op.   Interval History:  Patient seen and examined no acute events or new complaints overnight.  Patient reports he continues to feel distended, no complaints of pain He did have an episode of emesis this morning, >500 ccs Labs yesterday were reassuring, no new labs or imaging this morning No flatus or BM, he does endorse frequent burping He has been mobilizing  Vital signs in last 24 hours: [min-max] current  Temp:  [97.9 F (36.6 C)-98.9 F (37.2 C)] 98.2 F (36.8 C) (06/02 0451) Pulse Rate:  [86-106] 106 (06/02 0451) Resp:  [20] 20 (06/02 0451) BP: (128-155)/(83-93) 155/93 (06/02 0451) SpO2:  [100 %] 100 % (06/02 0451)     Height: 5\' 5"  (165.1 cm) Weight: 79.4 kg BMI (Calculated): 29.13   Intake/Output last 2 shifts:  06/01 0701 - 06/02 0700 In: 1449 [I.V.:1449] Out: 500 [Urine:500]   Physical Exam:  Constitutional: alert, cooperative and no distress  Respiratory: breathing non-labored at rest  Cardiovascular: regular rate and sinus rhythm  Gastrointestinal: soft, non-tender, remains distended, tympanic to percussion, no rebound or guarding Genitourinary: Foley in place, no hematuria Integumentary: Laparoscopic incisions are CDI with dermabond, no erythema or drainage  Labs:  CBC Latest Ref Rng & Units 08/22/2019 08/18/2019 08/17/2019  WBC 4.0 - 10.5 K/uL 7.6 12.7(H) 17.0(H)  Hemoglobin 13.0 - 17.0 g/dL 11.1(L) 11.1(L) 11.7(L)  Hematocrit 39.0 - 52.0 % 35.3(L) 33.1(L) 36.8(L)  Platelets 150 - 400 K/uL 370 272 298   CMP Latest Ref Rng & Units 08/22/2019 08/21/2019 08/20/2019  Glucose 70 - 99 mg/dL 124(H) 113(H) 102(H)  BUN 8 - 23 mg/dL 26(H) 24(H) 18  Creatinine 0.61 - 1.24 mg/dL 1.12 0.92 0.82  Sodium 135 - 145 mmol/L 141 142 140  Potassium 3.5 - 5.1 mmol/L 4.5 3.9 3.5  Chloride 98 - 111 mmol/L 108 107 106  CO2 22 - 32 mmol/L 25 27 25   Calcium 8.9 - 10.3 mg/dL 8.5(L)  8.8(L) 8.7(L)  Total Protein 6.1 - 8.1 g/dL - - -  Total Bilirubin 0.2 - 1.2 mg/dL - - -  Alkaline Phos 39 - 117 U/L - - -  AST 10 - 35 U/L - - -  ALT 9 - 46 U/L - - -    Imaging studies: No new pertinent imaging studies   Assessment/Plan:  64 y.o. male with persistent post-operative ileus awaiting return of bowel function 6 Days Post-Op s/p robotic assisted laparoscopic right hemicolectomy and radical prostatectomy with pelvic lymph node dissection (Dr Erlene Quan) for prostate cancer   - Given persistent ileus without bowel function, I do think it is appropriate to initiate TPN today; I will consult for PICC placement  - I did discuss the role of NGT placement in these situations for symptom management, he would like to hold off. He understands that if his nausea/emesis return or distension worsens I will place this   - Remain NPO  - Continue IVF resuscitation  - Monitor electrolytes while NPO; will order nutritional labs for tomorrow, electrolytes were normal 06/01 (okay to initiate TPN today)  - Monitor abdominal examination; on-going bowel function  - Will order KUB this morning for reassessment  - Continue foley per urology   - Mobilization encouraged    All of the above findings and recommendations were discussed with the patient, and the medical team, and all of patient's questions were answered to his expressed satisfaction.  -- Alroy Dust  Freda Jackson [facility] 08/23/2019, 10:12 AM [phone] M-F: 7am - 4pm

## 2019-08-23 NOTE — Progress Notes (Signed)
Peripherally Inserted Central Catheter Placement  The IV Nurse has discussed with the patient and/or persons authorized to consent for the patient, the purpose of this procedure and the potential benefits and risks involved with this procedure.  The benefits include less needle sticks, lab draws from the catheter, and the patient may be discharged home with the catheter. Risks include, but not limited to, infection, bleeding, blood clot (thrombus formation), and puncture of an artery; nerve damage and irregular heartbeat and possibility to perform a PICC exchange if needed/ordered by physician.  Alternatives to this procedure were also discussed.  Bard Power PICC patient education guide, fact sheet on infection prevention and patient information card has been provided to patient /or left at bedside.    PICC Placement Documentation  PICC Double Lumen 0000000 PICC Right Basilic 37 cm 0 cm (Active)  Indication for Insertion or Continuance of Line Administration of hyperosmolar/irritating solutions (i.e. TPN, Vancomycin, etc.) 08/23/19 1326  Exposed Catheter (cm) 0 cm 08/23/19 1326  Site Assessment Clean;Dry;Intact 08/23/19 1326  Lumen #1 Status Flushed;Saline locked;Blood return noted 08/23/19 1326  Lumen #2 Status Flushed;Saline locked;Blood return noted 08/23/19 1326  Dressing Type Transparent;Securing device 08/23/19 1326  Dressing Change Due 08/30/19 08/23/19 1326       Frances Maywood 08/23/2019, 1:29 PM

## 2019-08-24 ENCOUNTER — Ambulatory Visit: Payer: 59 | Admitting: Physician Assistant

## 2019-08-24 ENCOUNTER — Inpatient Hospital Stay: Payer: 59

## 2019-08-24 LAB — CBC
HCT: 34.1 % — ABNORMAL LOW (ref 39.0–52.0)
Hemoglobin: 10.9 g/dL — ABNORMAL LOW (ref 13.0–17.0)
MCH: 24.3 pg — ABNORMAL LOW (ref 26.0–34.0)
MCHC: 32 g/dL (ref 30.0–36.0)
MCV: 75.9 fL — ABNORMAL LOW (ref 80.0–100.0)
Platelets: 388 10*3/uL (ref 150–400)
RBC: 4.49 MIL/uL (ref 4.22–5.81)
RDW: 14.1 % (ref 11.5–15.5)
WBC: 7 10*3/uL (ref 4.0–10.5)
nRBC: 0.3 % — ABNORMAL HIGH (ref 0.0–0.2)

## 2019-08-24 LAB — TRIGLYCERIDES: Triglycerides: 105 mg/dL (ref <150)

## 2019-08-24 LAB — BASIC METABOLIC PANEL
Anion gap: 8 (ref 5–15)
BUN: 19 mg/dL (ref 8–23)
CO2: 23 mmol/L (ref 22–32)
Calcium: 8.8 mg/dL — ABNORMAL LOW (ref 8.9–10.3)
Chloride: 107 mmol/L (ref 98–111)
Creatinine, Ser: 0.97 mg/dL (ref 0.61–1.24)
GFR calc Af Amer: >60 mL/min (ref >60)
GFR calc non Af Amer: >60 mL/min (ref >60)
Glucose, Bld: 133 mg/dL — ABNORMAL HIGH (ref 70–99)
Potassium: 3.9 mmol/L (ref 3.5–5.1)
Sodium: 138 mmol/L (ref 135–145)

## 2019-08-24 LAB — GLUCOSE, CAPILLARY
Glucose-Capillary: 129 mg/dL — ABNORMAL HIGH (ref 70–99)
Glucose-Capillary: 134 mg/dL — ABNORMAL HIGH (ref 70–99)
Glucose-Capillary: 152 mg/dL — ABNORMAL HIGH (ref 70–99)

## 2019-08-24 LAB — PHOSPHORUS: Phosphorus: 3.1 mg/dL (ref 2.5–4.6)

## 2019-08-24 LAB — PREALBUMIN: Prealbumin: 16.4 mg/dL — ABNORMAL LOW (ref 18–38)

## 2019-08-24 LAB — MAGNESIUM: Magnesium: 2 mg/dL (ref 1.7–2.4)

## 2019-08-24 MED ORDER — DEXTROSE-NACL 5-0.45 % IV SOLN: INTRAVENOUS | Status: DC

## 2019-08-24 MED ORDER — TRACE MINERALS CU-MN-SE-ZN 300-55-60-3000 MCG/ML IV SOLN
INTRAVENOUS | Status: AC
  Filled 2019-08-24: qty 1992

## 2019-08-24 MED ORDER — FAT EMULSION PLANT BASED 20 % IV EMUL
250.0000 mL | INTRAVENOUS | Status: AC
  Administered 2019-08-24: 250 mL via INTRAVENOUS
  Filled 2019-08-24: qty 250

## 2019-08-24 MED ORDER — PHENOL 1.4 % MT LIQD
1.0000 | OROMUCOSAL | Status: DC | PRN
  Administered 2019-08-25: 1 via OROMUCOSAL
  Filled 2019-08-24 (×2): qty 177

## 2019-08-24 NOTE — Progress Notes (Signed)
Urology Inpatient Progress Note  Subjective: Tony Hernandez is a 64 y.o. male admitted on 08/17/2019 for scheduled robotic assisted laparoscopic radical prostatectomy, bilateral nerve sparing, with bilateral pelvic lymph node dissection with Dr. 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 Adora Fridge for management of large ascending colon with high-grade dysplasia.  Postoperative course complicated by ileus; patient was started on TPN yesterday.  Today, patient reports several bowel movements overnight.  He is passing flatus.  He notes improvement in his abdominal distention.  He is tolerating his Foley catheter well with no acute concerns.  Anti-infectives: Anti-infectives (From admission, onward)   Start     Dose/Rate Route Frequency Ordered Stop   08/18/19 0600  cefoTEtan (CEFOTAN) 2 g in sodium chloride 0.9 % 100 mL IVPB     2 g 200 mL/hr over 30 Minutes Intravenous On call to O.R. 08/17/19 1700 08/19/19 0559   08/17/19 1130  cefoTEtan (CEFOTAN) 2 g in sodium chloride 0.9 % 100 mL IVPB  Status:  Discontinued     2 g 200 mL/hr over 30 Minutes Intravenous  Once 08/17/19 1122 08/17/19 1655   08/17/19 0629  sodium chloride 0.9 % with cefoTEtan (CEFOTAN) ADS Med    Note to Pharmacy: Trudie Reed   : cabinet override      08/17/19 0629 08/17/19 0756   08/17/19 0600  cefoTEtan (CEFOTAN) 2 g in sodium chloride 0.9 % 100 mL IVPB     2 g 200 mL/hr over 30 Minutes Intravenous On call to O.R. 08/16/19 2211 08/17/19 0816      Current Facility-Administered Medications  Medication Dose Route Frequency Provider Last Rate Last Admin  . Marland KitchenTPN (CLINIMIX-E) Adult   Intravenous Continuous TPN Dallie Piles, RPH 40 mL/hr at 08/24/19 0000 Rate Verify at 08/24/19 0000  . acetaminophen (TYLENOL) tablet 1,000 mg  1,000 mg Oral Q6H Pabon, Diego F, MD   1,000 mg at 08/24/19 0917  . Chlorhexidine  Gluconate Cloth 2 % PADS 6 each  6 each Topical Daily Jules Husbands, MD   6 each at 08/24/19 [phone]  . dextrose 5 % and 0.45 % NaCl with KCl 20 mEq/L infusion   Intravenous Continuous Dallie Piles, RPH 60 mL/hr at 08/24/19 0916 New Bag at 08/24/19 0916  . diphenhydrAMINE (BENADRYL) 12.5 MG/5ML elixir 12.5 mg  12.5 mg Oral Q6H PRN Pabon, Diego F, MD       Or  . diphenhydrAMINE (BENADRYL) injection 12.5 mg  12.5 mg Intravenous Q6H PRN Pabon, Diego F, MD      . enoxaparin (LOVENOX) injection 40 mg  40 mg Subcutaneous Q24H Pabon, Diego F, MD   40 mg at 08/24/19 0917  . guaiFENesin (ROBITUSSIN) 100 MG/5ML solution 100 mg  5 mL Oral Q4H PRN Herbert Pun, MD   100 mg at 08/18/19 0612  . insulin aspart (novoLOG) injection 0-9 Units  0-9 Units Subcutaneous Q6H Dallie Piles, RPH      . levothyroxine (SYNTHROID) tablet 150 mcg  150 mcg Oral Daily Pabon, [place] F, MD   150 mcg at 08/24/19 0540  . morphine 2 MG/ML injection 2 mg  2 mg Intravenous Q3H PRN Jules Husbands, MD   2 mg at 08/22/19 2024  . ondansetron (ZOFRAN-ODT) disintegrating tablet 4 mg  4 mg Oral Q6H PRN Pabon, Diego F, MD       Or  . ondansetron (ZOFRAN) injection 4 mg  4 mg Intravenous Q6H PRN Caroleen Hamman F, MD   4 mg at 08/22/19 0233  . oxybutynin (DITROPAN) tablet 5 mg  5 mg Oral Q8H PRN Ralynn San, PA-C      . oxyCODONE (Oxy IR/ROXICODONE) immediate release tablet 5-10 mg  5-10 mg Oral Q4H PRN Pabon, Diego F, MD   10 mg at 08/22/19 1302  . prochlorperazine (COMPAZINE) tablet 10 mg  10 mg Oral Q6H PRN Pabon, Diego F, MD       Or  . prochlorperazine (COMPAZINE) injection 5-10 mg  5-10 mg Intravenous Q6H PRN Caroleen Hamman F, MD   10 mg at 08/22/19 2024  . sodium chloride flush (NS) 0.9 % injection 10-40 mL  10-40 mL Intracatheter Q12H Pabon, Diego F, MD   20 mL at 08/24/19 0918  . sodium chloride flush (NS) 0.9 % injection 10-40 mL  10-40 mL Intracatheter PRN Pabon, Diego F, MD       Objective: Vital signs in last  24 hours: Temp:  [98 F (36.7 C)-98.4 F (36.9 C)] 98.4 F (36.9 C) (06/03 0400) Pulse Rate:  [91-99] 99 (06/03 0400) Resp:  [16-20] 20 (06/03 0400) BP: (133-138)/(84-90) 133/86 (06/03 0400) SpO2:  [99 %-100 %] 99 % (06/03 0400) Weight:  [78.8 kg] 78.8 kg (06/02 1137)  Intake/Output from previous day: 06/02 0701 - 06/03 0700 In: 1216.7 [P.O.:480; I.V.:736.7] Out: 1100 [Urine:1100] Intake/Output this shift: No intake/output data recorded.  Physical Exam Vitals and nursing note reviewed.  Constitutional:      General: He is not in acute distress.    Appearance: He is not ill-appearing, toxic-appearing or diaphoretic.  HENT:     Head: Normocephalic and atraumatic.  Pulmonary:     Effort: Pulmonary effort is normal. No respiratory distress.  Abdominal:     General: There is distension.     Palpations: Abdomen is soft.     Tenderness: There is no guarding or rebound.     Comments: Tympanic  Skin:    General: Skin is warm and dry.  Neurological:     Mental Status: He is alert and oriented to person, place, and time.  Psychiatric:        Mood and Affect: Mood normal.        Behavior: Behavior normal.    Lab Results:  Recent Labs    08/22/19 0417 08/24/19 0922  WBC 7.6 7.0  HGB 11.1* 10.9*  HCT 35.3* 34.1*  PLT 370 388   BMET Recent Labs    08/22/19 0417 08/24/19 0922  NA 141 138  K 4.5 3.9  CL 108 107  CO2 25 23  GLUCOSE 124* 133*  BUN 26* 19  CREATININE 1.12 0.97  CALCIUM 8.5* 8.8*   Assessment & Plan: 64 year old male s/p combined robotic assisted laparoscopic radical prostatectomy with bilateral pelvic lymph node dissection by urology as well as robotic assisted laparoscopic right colectomy with splenic flexure takedown by general surgery with postoperative course complicated by ileus, started on TPN.  Ileus appears to be resolving today with increased bowel function overnight.  However, I am concerned that his prolonged NPO period may have impacted his  anastomotic healing.  In discussion with Dr. Erlene Quan, we have elected to defer Foley catheter removal today.  Recommend continuing Foley until at least Tuesday, 08/29/2019.  Will revisit at that time based on his clinical status.  If he is outpatient at that time, can see him in clinic for Foley removal.  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  Patient is in agreement with this plan.  Debroah Loop, PA-C 08/24/2019

## 2019-08-24 NOTE — Progress Notes (Signed)
[facility] Day(s): 7.   Post op day(s): 7 Days Post-Op.   Interval History:  Patient seen and examined no acute events or new complaints overnight.  Patient reports he is feeling better this morning Feels distension is improving, no longer with burping No fever, chills, nausea, or emesis Nutritional labs pending this morning, these had not been drawn yet Started on TPN yesterday secondary to prolonged ileus  He did report he has started passing flatus, had BM Mobilizing well  Vital signs in last 24 hours: [min-max] current  Temp:  [98 F (36.7 C)-98.4 F (36.9 C)] 98.4 F (36.9 C) (06/03 0400) Pulse Rate:  [91-99] 99 (06/03 0400) Resp:  [16-20] 20 (06/03 0400) BP: (133-138)/(84-90) 133/86 (06/03 0400) SpO2:  [99 %-100 %] 99 % (06/03 0400) Weight:  [78.8 kg] 78.8 kg (06/02 1137)     Height: 5\' 5"  (165.1 cm) Weight: 78.8 kg(bed scale) BMI (Calculated): 28.91   Intake/Output last 2 shifts:  06/02 0701 - 06/03 0700 In: 1216.7 [P.O.:480; I.V.:736.7] Out: 1100 [Urine:1100]   Physical Exam:  Constitutional: alert, cooperative and no distress  Respiratory: breathing non-labored at rest  Cardiovascular: regular rate and sinus rhythm  Gastrointestinal: Soft, non-tender, tympany and distension improved from yesterday, no rebound/guarding Genitourinary: Foley in place, no hematuria Integumentary: Laparoscopic incisions are CDI with dermabond, no erythema or drainage  Labs:  CBC Latest Ref Rng & Units 08/22/2019 08/18/2019 08/17/2019  WBC 4.0 - 10.5 K/uL 7.6 12.7(H) 17.0(H)  Hemoglobin 13.0 - 17.0 g/dL 11.1(L) 11.1(L) 11.7(L)  Hematocrit 39.0 - 52.0 % 35.3(L) 33.1(L) 36.8(L)  Platelets 150 - 400 K/uL 370 272 298   CMP Latest Ref Rng & Units 08/22/2019 08/21/2019 08/20/2019  Glucose 70 - 99 mg/dL 124(H) 113(H) 102(H)  BUN 8 - 23 mg/dL 26(H) 24(H) 18  Creatinine 0.61 - 1.24 mg/dL 1.12 0.92 0.82  Sodium 135 - 145 mmol/L 141 142 140   Potassium 3.5 - 5.1 mmol/L 4.5 3.9 3.5  Chloride 98 - 111 mmol/L 108 107 106  CO2 22 - 32 mmol/L 25 27 25   Calcium 8.9 - 10.3 mg/dL 8.5(L) 8.8(L) 8.7(L)  Total Protein 6.1 - 8.1 g/dL - - -  Total Bilirubin 0.2 - 1.2 mg/dL - - -  Alkaline Phos 39 - 117 U/L - - -  AST 10 - 35 U/L - - -  ALT 9 - 46 U/L - - -    Imaging studies: No new pertinent imaging studies   Assessment/Plan:  64 y.o. male with clinically improving post-operative ileus 7 Days Post-Op s/p robotic assisted laparoscopic right hemicolectomy and radical prostatectomy with pelvic lymph node dissection (Dr Erlene Quan) for prostate cancer   - He had CLD ordered; okay for sips as tolerated. Encouraged him to take it easy, sips of liquids okay today  - Okay to advance TPN to goal rate today pending labs to ensure he is not refeeding  - Continue IVF resuscitation             - Monitor electrolytes/mutritional labs   - Monitor abdominal examination; on-going bowel function   - Continue foley per urology              - Mobilization encouraged     All of the above findings and recommendations were discussed with the patient, and the medical team, and all of patient's questions were answered to his expressed satisfaction.  -- Edison Simon, PA-C [facility] 08/24/2019, 7:26 AM [phone] M-F: 7am -  4pm

## 2019-08-24 NOTE — Consult Note (Signed)
PHARMACY - TOTAL PARENTERAL NUTRITION CONSULT NOTE   Indication: High Risk for Malnutrition  Patient Measurements: Height: 5\' 5"  (165.1 cm) Weight: 78.8 kg (173 lb 11.6 oz)(bed scale) IBW/kg (Calculated) : 61.5 TPN AdjBW (KG): 66 Body mass index is 28.91 kg/m.  Assessment: 64 y.o. male with persistent post-operative ileus awaiting return of bowel function 6 Days Post-Op s/p robotic assisted laparoscopic right hemicolectomy and radical prostatectomy with pelvic lymph node dissection for prostate cancer  Glucose / Insulin: no h/o DM, sSSI (none required) CBG: 133 - 134 Electrolytes: WNL Renal: Scr 1.24-->0.82-->1.12-->0.97 LFTs / TGs: TG 105 at baseline Prealbumin / albumin: pending result Intake / Output; MIVF: D5 & 0.45% NaCl GI Imaging: no new imaging since starting TPN Surgeries / Procedures: no new surgeries since starting TPN  Central access: 08/23/19 TPN start date: 08/23/19  Nutritional Goals (per RD recommendation on 08/23/19): kCal/day: 2000 - 3000, Protein: 100-115 g/day, Fluid: >2300 mL/day Goal TPN rate is 83 mL/hr (provides 100 g of protein and 2233 kcals per day)  Current Nutrition:  Clear liquids  Plan:   advance E 5/20 TPN to 83 mL/hr at 1800 + 20% ILE at 20 mL/hr x 12 hrs  Electrolytes in TPN: standard  Add standard MVI and trace elements to TPN  continue Sensitive q6h SSI and adjust as needed   Reduce MIVF to 20 mL/hr at 1800  Monitor TPN labs on Mon/Thurs and more often if clinically appropriate  Labs are wnl: continue daily until stable x 3 days  Tony Hernandez 08/24/2019,7:05 AM

## 2019-08-25 ENCOUNTER — Inpatient Hospital Stay: Payer: 59

## 2019-08-25 ENCOUNTER — Telehealth: Payer: Self-pay | Admitting: Emergency Medicine

## 2019-08-25 ENCOUNTER — Encounter: Payer: Self-pay | Admitting: Surgery

## 2019-08-25 LAB — CBC
HCT: 36.5 % — ABNORMAL LOW (ref 39.0–52.0)
Hemoglobin: 11.6 g/dL — ABNORMAL LOW (ref 13.0–17.0)
MCH: 24.4 pg — ABNORMAL LOW (ref 26.0–34.0)
MCHC: 31.8 g/dL (ref 30.0–36.0)
MCV: 76.8 fL — ABNORMAL LOW (ref 80.0–100.0)
Platelets: 447 10*3/uL — ABNORMAL HIGH (ref 150–400)
RBC: 4.75 MIL/uL (ref 4.22–5.81)
RDW: 14.5 % (ref 11.5–15.5)
WBC: 9.3 10*3/uL (ref 4.0–10.5)
nRBC: 0 % (ref 0.0–0.2)

## 2019-08-25 LAB — RENAL FUNCTION PANEL
Albumin: 3.2 g/dL — ABNORMAL LOW (ref 3.5–5.0)
Anion gap: 12 (ref 5–15)
BUN: 22 mg/dL (ref 8–23)
CO2: 25 mmol/L (ref 22–32)
Calcium: 9.2 mg/dL (ref 8.9–10.3)
Chloride: 103 mmol/L (ref 98–111)
Creatinine, Ser: 1.43 mg/dL — ABNORMAL HIGH (ref 0.61–1.24)
GFR calc Af Amer: >60 mL/min (ref >60)
GFR calc non Af Amer: 52 mL/min — ABNORMAL LOW (ref >60)
Glucose, Bld: 160 mg/dL — ABNORMAL HIGH (ref 70–99)
Phosphorus: 4.3 mg/dL (ref 2.5–4.6)
Potassium: 3.5 mmol/L (ref 3.5–5.1)
Sodium: 140 mmol/L (ref 135–145)

## 2019-08-25 LAB — GLUCOSE, CAPILLARY
Glucose-Capillary: 105 mg/dL — ABNORMAL HIGH (ref 70–99)
Glucose-Capillary: 134 mg/dL — ABNORMAL HIGH (ref 70–99)
Glucose-Capillary: 148 mg/dL — ABNORMAL HIGH (ref 70–99)
Glucose-Capillary: 151 mg/dL — ABNORMAL HIGH (ref 70–99)
Glucose-Capillary: 154 mg/dL — ABNORMAL HIGH (ref 70–99)
Glucose-Capillary: 172 mg/dL — ABNORMAL HIGH (ref 70–99)

## 2019-08-25 LAB — MAGNESIUM: Magnesium: 2 mg/dL (ref 1.7–2.4)

## 2019-08-25 MED ORDER — IOHEXOL 300 MG/ML  SOLN
100.0000 mL | Freq: Once | INTRAMUSCULAR | Status: AC | PRN
  Administered 2019-08-25: 100 mL via INTRAVENOUS

## 2019-08-25 MED ORDER — SODIUM CHLORIDE 0.9 % IV SOLN: INTRAVENOUS | Status: DC

## 2019-08-25 MED ORDER — FAT EMULSION PLANT BASED 20 % IV EMUL
250.0000 mL | INTRAVENOUS | Status: AC
  Administered 2019-08-25: 250 mL via INTRAVENOUS
  Filled 2019-08-25: qty 250

## 2019-08-25 MED ORDER — SODIUM CHLORIDE 0.9 % IV BOLUS
1000.0000 mL | Freq: Once | INTRAVENOUS | Status: AC
  Administered 2019-08-25: 1000 mL via INTRAVENOUS

## 2019-08-25 MED ORDER — ALBUMIN HUMAN 25 % IV SOLN
12.5000 g | Freq: Once | INTRAVENOUS | Status: AC
  Administered 2019-08-25: 12.5 g via INTRAVENOUS
  Filled 2019-08-25: qty 50

## 2019-08-25 MED ORDER — IOHEXOL 9 MG/ML PO SOLN
500.0000 mL | ORAL | Status: AC
  Administered 2019-08-25 (×2): 500 mL via ORAL

## 2019-08-25 MED ORDER — TRACE MINERALS CU-MN-SE-ZN 300-55-60-3000 MCG/ML IV SOLN
INTRAVENOUS | Status: AC
  Filled 2019-08-25: qty 1992

## 2019-08-25 NOTE — Progress Notes (Signed)
[facility] Day(s): 8.   Post op day(s): 8 Days Post-Op.   Interval History:  Patient seen and examined Overnight, had episode of hypotension and restlessness, systolic BP measured at 70, patient removed NGT. RR was called, he received a 1L bolus, and patient's BP improved.  This morning, patient reports he is feeling better No abdominal pain, distension, nausea, or emesis since last night He did have a slight AKI on labs this morning with sCr - 1.43 (which was previously normal), UO - 800 ccs in last 24 hours Prior to removal of NGT it had put out 4.0L He denied any flatus this morning, no bowel movements Has not been mobilizing much  Vital signs in last 24 hours: [min-max] current  Temp:  [97.7 F (36.5 C)-99.5 F (37.5 C)] 99.5 F (37.5 C) (06/03 2123) Pulse Rate:  [80-112] 84 (06/04 0546) Resp:  [18-20] 20 (06/04 0520) BP: (78-131)/(59-85) 106/74 (06/04 0546) SpO2:  [98 %-100 %] 100 % (06/04 0520) Weight:  [72.8 kg] 72.8 kg (06/04 0500)     Height: 5\' 5"  (165.1 cm) Weight: 72.8 kg BMI (Calculated): 26.69   Intake/Output last 2 shifts:  06/03 0701 - 06/04 0700 In: 1590.3 [I.V.:1590.3] Out: 4800 [Urine:800; Emesis/NG output:4000]   Physical Exam:  Constitutional: alert, cooperative and no distress  Respiratory: breathing non-labored at rest  Cardiovascular: regular rate and sinus rhythm  Gastrointestinal: Soft, non-tender, distension improved from yesterday, he remains tympanic, no rebound/guarding Genitourinary:Foley in place, no hematuria Integumentary:Laparoscopic incisions are CDI with dermabond, no erythema or drainage  Labs:  CBC Latest Ref Rng & Units 08/24/2019 08/22/2019 08/18/2019  WBC 4.0 - 10.5 K/uL 7.0 7.6 12.7(H)  Hemoglobin 13.0 - 17.0 g/dL 10.9(L) 11.1(L) 11.1(L)  Hematocrit 39.0 - 52.0 % 34.1(L) 35.3(L) 33.1(L)  Platelets 150 - 400 K/uL 388 370 272   CMP Latest Ref Rng & Units 08/25/2019 08/24/2019 08/22/2019   Glucose 70 - 99 mg/dL 160(H) 133(H) 124(H)  BUN 8 - 23 mg/dL 22 19 26(H)  Creatinine 0.61 - 1.24 mg/dL 1.43(H) 0.97 1.12  Sodium 135 - 145 mmol/L 140 138 141  Potassium 3.5 - 5.1 mmol/L 3.5 3.9 4.5  Chloride 98 - 111 mmol/L 103 107 108  CO2 22 - 32 mmol/L 25 23 25   Calcium 8.9 - 10.3 mg/dL 9.2 8.8(L) 8.5(L)  Total Protein 6.1 - 8.1 g/dL - - -  Total Bilirubin 0.2 - 1.2 mg/dL - - -  Alkaline Phos 39 - 117 U/L - - -  AST 10 - 35 U/L - - -  ALT 9 - 46 U/L - - -     Imaging studies: No new pertinent imaging studies   Assessment/Plan:  64 y.o. male with persistent prolonged post-surgical ileus 8 Days Post-Op s/p robotic assisted laparoscopic right hemicolectomy and radical prostatectomy with pelvic lymph node dissection (Dr Erlene Quan) for prostate cancer   - Continue NPO + TPN   - Will re-evaluate need for NGT, if he develops more emesis then recommend we replace this   - Continue IVF resuscitation - Monitor electrolytes/mutritional labs              - Monitor abdominal examination; on-going bowel function              - Continue foley per urology; maintain until 06/08 - Mobilization encouraged    All of the above findings and recommendations were discussed with the patient, and the medical team, and all of patient's questions were answered  to his expressed satisfaction.  -- Edison Simon, PA-C [facility] 08/25/2019, 7:09 AM [phone] M-F: 7am - 4pm

## 2019-08-25 NOTE — Plan of Care (Signed)
Continuing with plan of care. 

## 2019-08-25 NOTE — Progress Notes (Signed)
Rapid Response Event Note  Overview:  RR called to room 223 for hypotension    Initial Focused Assessment: Pt in bed alert and oriented. Had pulled out NGT. Bedside RN stated pt had been restless and cramping. Had BP with systolic in 89H.   Interventions: Labs drawn, repeat blood pressure had systolic of 99.  Plan of Care (if not transferred): RN will notify surgery and continue to assess.  Event Summary:   at  called at 0521    at   ended 20       Tony Hernandez A

## 2019-08-25 NOTE — Telephone Encounter (Signed)
Tony Hernandez from [facility]iology called stating Dr Jacalyn Lefevre wants Dr Tony Hernandez to review pt CT scan from today. He wants Dr Tony Hernandez to look at impressions 1, 3 and 4.   Outbound called to Dr Tony Hernandez to make him aware.

## 2019-08-25 NOTE — Progress Notes (Signed)
Ct reviewed and d/w radiologist. Ileus, some edema at anastomosis w/o leak. Continue NPO may need NGT. No surgical intervention

## 2019-08-25 NOTE — Consult Note (Signed)
PHARMACY - TOTAL PARENTERAL NUTRITION CONSULT NOTE   Indication: High Risk for Malnutrition  Patient Measurements: Height: 5\' 5"  (165.1 cm) Weight: 72.8 kg (160 lb 6.4 oz) IBW/kg (Calculated) : 61.5 TPN AdjBW (KG): 66 Body mass index is 26.69 kg/m.  Assessment: 64 y.o. male with persistent post-operative ileus awaiting return of bowel function 8 Days Post-Op s/p robotic assisted laparoscopic right hemicolectomy and radical prostatectomy with pelvic lymph node dissection for prostate cancer  Glucose / Insulin: no h/o DM, sSSI (none required) CBG: 129 - 160 Electrolytes: WNL Renal: Scr 1.24-->0.82-->1.12-->0.97>1.43 LFTs / TGs: TG 105 at baseline Prealbumin / albumin: 16.4/3.2 Intake / Output; MIVF:  NS@20ml /hr GI Imaging:multiple loops of dilated small bowel consistent with either persistent ileus or a degree of bowel obstruction. No free air evident. Clips noted right upper quadrant.  Surgeries / Procedures: no new surgeries since starting TPN  Central access: 08/23/19 TPN start date: 08/23/19  Nutritional Goals (per RD recommendation on 08/23/19): kCal/day: 2000 - 3000, Protein: 100-115 g/day, Fluid: >2300 mL/day Goal TPN rate is 83 mL/hr (provides 100 g of protein and 2233 kcals per day)  Current Nutrition:  Clear liquids  Plan:   Continue Clinimix E 5/20 TPN to 83 mL/hr at 1800 + 20% ILE at 20 mL/hr x 12 hrs  Electrolytes in TPN: standard  Add standard MVI and trace elements to TPN  continue Sensitive q6h SSI and adjust as needed   Reduce MIVF to 20 mL/hr at 1800  Monitor TPN labs on Mon/Thurs and more often if clinically appropriate  Labs are wnl: continue daily until stable x 3 days  Corby Vandenberghe A [place] 08/25/2019,11:55 AM

## 2019-08-26 LAB — GLUCOSE, CAPILLARY
Glucose-Capillary: 102 mg/dL — ABNORMAL HIGH (ref 70–99)
Glucose-Capillary: 111 mg/dL — ABNORMAL HIGH (ref 70–99)
Glucose-Capillary: 132 mg/dL — ABNORMAL HIGH (ref 70–99)
Glucose-Capillary: 156 mg/dL — ABNORMAL HIGH (ref 70–99)
Glucose-Capillary: 96 mg/dL (ref 70–99)

## 2019-08-26 LAB — COMPREHENSIVE METABOLIC PANEL
ALT: 41 U/L (ref 0–44)
AST: 23 U/L (ref 15–41)
Albumin: 3.6 g/dL (ref 3.5–5.0)
Alkaline Phosphatase: 48 U/L (ref 38–126)
Anion gap: 8 (ref 5–15)
BUN: 23 mg/dL (ref 8–23)
CO2: 26 mmol/L (ref 22–32)
Calcium: 9 mg/dL (ref 8.9–10.3)
Chloride: 104 mmol/L (ref 98–111)
Creatinine, Ser: 1.02 mg/dL (ref 0.61–1.24)
GFR calc Af Amer: >60 mL/min (ref >60)
GFR calc non Af Amer: >60 mL/min (ref >60)
Glucose, Bld: 121 mg/dL — ABNORMAL HIGH (ref 70–99)
Potassium: 3.5 mmol/L (ref 3.5–5.1)
Sodium: 138 mmol/L (ref 135–145)
Total Bilirubin: 0.4 mg/dL (ref 0.3–1.2)
Total Protein: 6.8 g/dL (ref 6.5–8.1)

## 2019-08-26 LAB — CBC
HCT: 34.9 % — ABNORMAL LOW (ref 39.0–52.0)
Hemoglobin: 11.1 g/dL — ABNORMAL LOW (ref 13.0–17.0)
MCH: 24 pg — ABNORMAL LOW (ref 26.0–34.0)
MCHC: 31.8 g/dL (ref 30.0–36.0)
MCV: 75.5 fL — ABNORMAL LOW (ref 80.0–100.0)
Platelets: 381 10*3/uL (ref 150–400)
RBC: 4.62 MIL/uL (ref 4.22–5.81)
RDW: 14.1 % (ref 11.5–15.5)
WBC: 11 10*3/uL — ABNORMAL HIGH (ref 4.0–10.5)
nRBC: 0 % (ref 0.0–0.2)

## 2019-08-26 LAB — URINALYSIS, ROUTINE W REFLEX MICROSCOPIC
Bacteria, UA: NONE SEEN
Bilirubin Urine: NEGATIVE
Glucose, UA: 50 mg/dL — AB
Ketones, ur: NEGATIVE mg/dL
Nitrite: NEGATIVE
Protein, ur: >=300 mg/dL — AB
RBC / HPF: >50 RBC/hpf — ABNORMAL HIGH (ref 0–5)
Specific Gravity, Urine: 1.023 (ref 1.005–1.030)
Squamous Epithelial / HPF: NONE SEEN (ref 0–5)
WBC, UA: >50 WBC/hpf — ABNORMAL HIGH (ref 0–5)
pH: 6 (ref 5.0–8.0)

## 2019-08-26 LAB — PHOSPHORUS: Phosphorus: 3.9 mg/dL (ref 2.5–4.6)

## 2019-08-26 LAB — MAGNESIUM: Magnesium: 2 mg/dL (ref 1.7–2.4)

## 2019-08-26 MED ORDER — FAT EMULSION PLANT BASED 20 % IV EMUL
250.0000 mL | INTRAVENOUS | Status: AC
  Administered 2019-08-26: 250 mL via INTRAVENOUS
  Filled 2019-08-26: qty 250

## 2019-08-26 MED ORDER — TRACE MINERALS CU-MN-SE-ZN 300-55-60-3000 MCG/ML IV SOLN
INTRAVENOUS | Status: AC
  Filled 2019-08-26: qty 1992

## 2019-08-26 NOTE — Consult Note (Signed)
PHARMACY - TOTAL PARENTERAL NUTRITION CONSULT NOTE   Indication: High Risk for Malnutrition  Patient Measurements: Height: 5\' 5"  (165.1 cm) Weight: 72.8 kg (160 lb 6.4 oz) IBW/kg (Calculated) : 61.5 TPN AdjBW (KG): 66 Body mass index is 26.69 kg/m.  Assessment: 64 y.o. male with persistent post-operative ileus awaiting return of bowel function 8 Days Post-Op s/p robotic assisted laparoscopic right hemicolectomy and radical prostatectomy with pelvic lymph node dissection for prostate cancer  Glucose / Insulin: no h/o DM, sSSI (2units in 24h) CBG: 102-156.  Electrolytes: WNL Renal: Scr 1.24-->0.82-->1.12-->0.97>1.43>>1.02 LFTs / TGs: TG 105 at baseline Prealbumin / albumin: 16.4/3.2 Intake / Output; MIVF:  NS@20ml /hr GI Imaging:multiple loops of dilated small bowel consistent with either persistent ileus or a degree of bowel obstruction. No free air evident. Clips noted right upper quadrant.  Surgeries / Procedures: no new surgeries since starting TPN  Central access: 08/23/19 TPN start date: 08/23/19  Nutritional Goals (per RD recommendation on 08/23/19): kCal/day: 2000 - 3000, Protein: 100-115 g/day, Fluid: >2300 mL/day Goal TPN rate is 83 mL/hr (provides 100 g of protein and 2233 kcals per day)  Current Nutrition:  Clear liquids  Plan:   Continue Clinimix E 5/20 TPN to 83 mL/hr at 1800 + 20% ILE at 20 mL/hr x 12 hrs  Electrolytes in TPN: standard  Add standard MVI and trace elements to TPN  continue Sensitive q6h SSI and adjust as needed   Reduce MIVF to 20 mL/hr at 1800  Monitor TPN labs on Mon/Thurs and more often if clinically appropriate  Labs are wnl: continue daily until stable x 3 days  Pernell Dupre, PharmD, BCPS Clinical Pharmacist 08/26/2019 10:43 AM

## 2019-08-26 NOTE — Progress Notes (Signed)
Urology Inpatient Progress Report  S/P right colectomy [Z90.49]  Procedure(s): XI ROBOT ASSISTED LAPAROSCOPIC PARTIAL COLECTOMY, Right XI ROBOTIC ASSISTED LAPAROSCOPIC PROSTATECTOMY, BILATERAL LYMPH NODE DISSECTION INDOCYANINE GREEN FLUORESCENCE IMAGING (ICG)  9 Days Post-Op   Intv/Subj: No acute events overnight. Patient is without complaint. Patient having loose bowel movements.  Abdomen seems softer.  Although this is my first time examining him.  He underwent a CT of the abdomen and pelvis given his abdominal distention.  This revealed likely ileus.  He also had a 2.5 cm indeterminate renal mass for which follow-up was recommended.  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  He also had a striated nephrogram.  However, clinically, he has no symptoms of pyelonephritis.  Overnight, he had some mild tachycardia and a maximum temperature of 99.6 white blood cell count 11.  Creatinine improving and is 1.02.  He had a little bit of low urine output last night with 250 cc.  He feels well today.  Active Problems:   S/P right colectomy  Current Facility-Administered Medications  Medication Dose Route Frequency Provider Last Rate Last Admin  . Marland KitchenTPN (CLINIMIX-E) Adult   Intravenous Continuous TPN Pearla Dubonnet, RPH 83 mL/hr at 08/25/19 1827 New Bag at 08/25/19 1827  . Marland KitchenTPN (CLINIMIX-E) Adult   Intravenous Continuous TPN Hallaji, Sheema M, RPH      . 0.9 %  sodium chloride infusion   Intravenous Continuous Jules Husbands, MD 20 mL/hr at 08/26/19 0008 New Bag at 08/26/19 0008  . acetaminophen (TYLENOL) tablet 1,000 mg  1,000 mg Oral Q6H Pabon, Diego F, MD   1,000 mg at 08/24/19 0917  . Chlorhexidine Gluconate Cloth 2 % PADS 6 each  6 each Topical Daily Pabon, [place] F, MD   6 each at 08/26/19 1000  . diphenhydrAMINE (BENADRYL) 12.5 MG/5ML elixir 12.5 mg  12.5 mg Oral Q6H PRN Pabon, Diego F, MD       Or  . diphenhydrAMINE (BENADRYL) injection  12.5 mg  12.5 mg Intravenous Q6H PRN Pabon, Diego F, MD      . enoxaparin (LOVENOX) injection 40 mg  40 mg Subcutaneous Q24H Pabon, Diego F, MD   40 mg at 08/26/19 0813  . fat emul(INTRAlipid) 20 % infusion 250 mL  250 mL Intravenous Continuous TPN Hallaji, Sheema M, RPH      . guaiFENesin (ROBITUSSIN) 100 MG/5ML solution 100 mg  5 mL Oral Q4H PRN Herbert Pun, MD   100 mg at 08/18/19 0612  . insulin aspart (novoLOG) injection 0-9 Units  0-9 Units Subcutaneous Q6H Dallie Piles, [facility]   2 Units at 08/26/19 0112  . levothyroxine (SYNTHROID) tablet 150 mcg  150 mcg Oral Daily Pabon, [place] F, MD   150 mcg at 08/24/19 0540  . morphine 2 MG/ML injection 2 mg  2 mg Intravenous Q3H PRN Jules Husbands, MD   2 mg at 08/22/19 2024  . ondansetron (ZOFRAN-ODT) disintegrating tablet 4 mg  4 mg Oral Q6H PRN Pabon, Diego F, MD       Or  . ondansetron (ZOFRAN) injection 4 mg  4 mg Intravenous Q6H PRN Pabon, Diego F, MD   4 mg at 08/24/19 1534  . oxybutynin (DITROPAN) tablet 5 mg  5 mg Oral Q8H PRN Vaillancourt, Samantha, PA-C      . oxyCODONE (Oxy IR/ROXICODONE) immediate release tablet 5-10 mg  5-10 mg Oral Q4H PRN Pabon, Diego F, MD   10 mg at  08/22/19 1302  . phenol (CHLORASEPTIC) mouth spray 1 spray  1 spray Mouth/Throat PRN Jules Husbands, MD   1 spray at 08/25/19 0052  . prochlorperazine (COMPAZINE) tablet 10 mg  10 mg Oral Q6H PRN Pabon, Diego F, MD       Or  . prochlorperazine (COMPAZINE) injection 5-10 mg  5-10 mg Intravenous Q6H PRN Caroleen Hamman F, MD   10 mg at 08/22/19 2024  . sodium chloride flush (NS) 0.9 % injection 10-40 mL  10-40 mL Intracatheter Q12H Pabon, Diego F, MD   10 mL at 08/26/19 0900  . sodium chloride flush (NS) 0.9 % injection 10-40 mL  10-40 mL Intracatheter PRN Caroleen Hamman F, MD         Objective: Vital: Vitals:   08/25/19 2113 08/26/19 0428 08/26/19 0824 08/26/19 1209  BP: 135/85 127/84 (!) 120/95 (!) 124/101  Pulse: (!) 101 (!) 106 (!) 105 (!) 106  Resp: 20 20  19 14   Temp: 99.6 F (37.6 C) 98.4 F (36.9 C) 98.5 F (36.9 C) 98 F (36.7 C)  TempSrc: Oral Oral Oral Oral  SpO2: 99% 98% 100% 100%  Weight:      Height:       I/Os: I/O last 3 completed shifts: In: 2522.5 [I.V.:2522.5] Out: 3300 [Urine:1000; Emesis/NG output:2300]  Physical Exam:  General: Patient is in no apparent distress Lungs: Normal respiratory effort, chest expands symmetrically. GI: Incisions are c/d/i. The abdomen is soft and nontender without mass.  It is mildly distended but again it is soft. Foley: Draining clear yellow urine Ext: lower extremities symmetric  Lab Results: Recent Labs    08/24/19 0922 08/25/19 0523 08/26/19 0549  WBC 7.0 9.3 11.0*  HGB 10.9* 11.6* 11.1*  HCT 34.1* 36.5* 34.9*   Recent Labs    08/24/19 0922 08/25/19 0523 08/26/19 0549  NA 138 140 138  K 3.9 3.5 3.5  CL 107 103 104  CO2 23 25 26   GLUCOSE 133* 160* 121*  BUN 19 22 23   CREATININE 0.97 1.43* 1.02  CALCIUM 8.8* 9.2 9.0   No results for input(s): LABPT, INR in the last 72 hours. No results for input(s): LABURIN in the last 72 hours. Results for orders placed or performed during the hospital encounter of 08/15/19  SARS CORONAVIRUS 2 (TAT 6-24 HRS) Nasopharyngeal Nasopharyngeal Swab     Status: None   Collection Time: 08/15/19 10:37 AM   Specimen: Nasopharyngeal Swab  Result Value Ref Range Status   SARS Coronavirus 2 NEGATIVE NEGATIVE Final    Comment: (NOTE) SARS-CoV-2 target nucleic acids are NOT DETECTED. The SARS-CoV-2 RNA is generally detectable in upper and lower respiratory specimens during the acute phase of infection. Negative results do not preclude SARS-CoV-2 infection, do not rule out co-infections with other pathogens, and should not be used as the sole basis for treatment or other patient management decisions. Negative results must be combined with clinical observations, patient history, and epidemiological information. The expected result is  Negative. Fact Sheet for Patients: SugarRoll.be Fact Sheet for Healthcare Providers: https://www.woods-mathews.com/ This test is not yet approved or cleared by the [place] FDA and  has been authorized for detection and/or diagnosis of SARS-CoV-2 by FDA under an Emergency Use Authorization (EUA). This EUA will remain  in effect (meaning this test can be used) for the duration of the COVID-19 declaration under Section 56 4(b)(1) of the Act, 21 U.S.C. section 360bbb-3(b)(1), unless the authorization is terminated or revoked sooner. Performed at [facility], [address], [place], [place] [address]     Studies/Results: DG Abd 1 View  Result Date: 08/24/2019 CLINICAL DATA:  NG tube placement EXAM: ABDOMEN - 1 VIEW COMPARISON:  None. FINDINGS: NG tube tip is in the proximal stomach. Dilated small bowel loops, similar prior study. IMPRESSION: NG tube tip in the proximal stomach. Electronically Signed   By: Rolm Baptise M.D.   On: 08/24/2019 18:21   CT ABDOMEN PELVIS W CONTRAST  Result Date: 08/25/2019 CLINICAL DATA:  History of RIGHT hemicolectomy and concurrent prostatectomy. Nausea vomiting, suspicious for ileus. EXAM: CT ABDOMEN AND PELVIS WITH CONTRAST TECHNIQUE: Multidetector CT imaging of the abdomen and pelvis was performed using the standard protocol following bolus administration of intravenous contrast. CONTRAST:  11mL OMNIPAQUE IOHEXOL 300 MG/ML  SOLN COMPARISON:  Preoperative assessment 07/31/2019 FINDINGS: Lower chest: Basilar atelectasis. No dense consolidation or evidence of pleural effusion. Central venous access device terminates in the upper RIGHT atrium. Hepatobiliary: Portal vein is patent into the liver. Post cholecystectomy with mild intra and extrahepatic biliary ductal distension. No suspicious focal hepatic abnormality. Pancreas: No ductal dilation, inflammation or lesion. Spleen: Spleen normal size without focal lesion.  Adrenals/Urinary Tract: Adrenal glands are normal. Patchy hypoattenuation in the renal parenchyma bilaterally compatible with striated nephrogram, more focal areas in the LEFT upper pole laterally and in the anterior interpolar RIGHT kidney. There is no sign of hydronephrosis. Foley catheter present in the urinary bladder. Postcontrast density greater than 52 Hounsfield units of a lesion arising from the upper pole the RIGHT kidney, well-circumscribed measuring 2.5 x 2.1 cm unchanged from recent imaging. Stomach/Bowel: Global distension of small bowel. Ingested positive enteric contrast passes into the jejunum. There is gradual dilution of contrast material as it passes more distally. A gradual transition is noted proximal to the ileocecal anastomosis where there is mild thickening of the anastomotic site. No Peri anastomotic fluid or free air. Fluid filled colon becomes less distended distally Vascular/Lymphatic: Calcific and noncalcific atheromatous plaque in the abdominal aorta. No aneurysmal dilation. No adenopathy in the upper abdomen or in the retroperitoneum. No evidence of pelvic adenopathy. Along the LEFT external iliac chain is a 4.4 x 2.1 cm area of fluid density without peripheral enhancement. Less well-defined RIGHT pelvic sidewall fluid density is noted as well as fluid density in the space of Retzius anterior to the urinary bladder following prostatectomy and pelvic lymphadenectomy. Reproductive: Post prostatectomy with pelvic fluid as described. Fluid tracking anteriorly into the extraperitoneal spaces of the pelvis measuring approximately 2.3 cm greatest thickness extending cephalad approximately 5.5 cm without peripheral enhancement. Other: No intraperitoneal fluid collection. Musculoskeletal: Spinal degenerative changes. No acute or destructive bone finding. IMPRESSION: 1. Patchy hypoattenuation in the renal parenchyma bilaterally compatible with striated nephrogram, more focal areas in the LEFT  upper pole laterally and in the anterior interpolar RIGHT kidney. Findings are concerning for pyelonephritis. Correlation with urine studies 2. Small bowel distension with gradual transition, developing small bowel obstruction versus is marked postoperative ileus. Attention on follow-up is suggested. 3. Extraperitoneal fluid in the pelvis may represent a mixture of postoperative change and developing lymphocele particularly along the LEFT external iliac chain. Strictly speaking extraperitoneal vesicourethral anastomotic leak is not excluded. However, findings remain nonspecific and potentially in the range of normal postoperative findings. Consider nonemergent cystogram prior to catheter removal as clinically warranted. 4. Intermediate density lesion associated with upper pole RIGHT kidney. Solid lesion versus hemorrhagic or proteinaceous cyst is considered. Follow-up renal protocol CT is suggested on a  nonemergent basis for specific assessment of this finding to exclude renal neoplasm. 5. Post cholecystectomy with mild intra and extrahepatic biliary ductal distension. 6. Aortic atherosclerosis. These results will be called to the ordering clinician or representative by the Radiologist Assistant, and communication documented in the PACS or [employer]. Aortic Atherosclerosis (ICD10-I70.0). Electronically Signed   By: Zetta Bills M.D.   On: 08/25/2019 14:46   DG Abd Portable 2V  Result Date: 08/25/2019 CLINICAL DATA:  Ileus.  Recent abdominal surgery EXAM: PORTABLE ABDOMEN - 2 VIEW COMPARISON:  August 24, 2019 FINDINGS: Nasogastric tube no longer evident. There remains dilatation of multiple loops of small bowel. Colon appears decompressed. No free air. Surgical clips noted in right upper abdomen. Visualized lung bases are clear. Apparent central catheter tip in superior vena cava, stable. IMPRESSION: Nasogastric tube no longer evident. There remain multiple loops of dilated small bowel consistent with either  persistent ileus or a degree of bowel obstruction. No free air evident. Clips noted right upper quadrant. Electronically Signed   By: Lowella Grip III M.D.   On: 08/25/2019 10:48   CT scan personally reviewed and is detailed in the history of present illness as well as above.  Assessment: Prostate cancer Postoperative ileus Indeterminate renal mass  Procedure(s): XI ROBOT ASSISTED LAPAROSCOPIC PARTIAL COLECTOMY, Right XI ROBOTIC ASSISTED LAPAROSCOPIC PROSTATECTOMY, BILATERAL LYMPH NODE DISSECTION INDOCYANINE GREEN FLUORESCENCE IMAGING (ICG), 9 Days Post-Op  doing well.  Plan: Clinically, he does not seem to have pyelonephritis.  Recommend against antibiotics at this time.  Agree with continuing TPN and n.p.o. today.  If he does well, agree with slowly advancing diet.  Lymphocele is not unexpected after the lymph node dissection.  Observe this.  He may need a little bit more fluid resuscitation given his low urine output and borderline tachycardia which I think is more related to hypovolemia.  Continue Foley catheter until at least 6/8.  Link Snuffer, MD Urology 08/26/2019, 1:29 PM

## 2019-08-26 NOTE — Progress Notes (Signed)
Subjective:  CC: Tony Hernandez is a 64 y.o. male  Hospital stay day 9, 9 Days Post-Op persistent prolonged post-surgical ileus  s/p robotic assisted laparoscopic right hemicolectomy and radical prostatectomy with pelvic lymph node dissection (Dr Erlene Quan) for prostate cancer  HPI: No acute issues overnight.  Patient states she has been having multiple bowel movements and flatus during BMs.  He does state he has moderate amount of hiccups.  Of note, EMs have not been recorded by the nurse, and she stated concerns about increasing distention on exam this morning  ROS:  General: Denies weight loss, weight gain, fatigue, fevers, chills, and night sweats. Heart: Denies chest pain, palpitations, racing heart, irregular heartbeat, leg pain or swelling, and decreased activity tolerance. Respiratory: Denies breathing difficulty, shortness of breath, wheezing, cough, and sputum. GI: Denies change in appetite, heartburn, nausea, vomiting, constipation, diarrhea, and blood in stool. GU: Denies difficulty urinating, pain with urinating, urgency, frequency, blood in urine.   Objective:   Temp:  [97.5 F (36.4 C)-99.6 F (37.6 C)] 98.5 F (36.9 C) (06/05 0824) Pulse Rate:  [95-106] 105 (06/05 0824) Resp:  [18-20] 19 (06/05 0824) BP: (120-135)/(84-95) 120/95 (06/05 0824) SpO2:  [98 %-100 %] 100 % (06/05 0824)     Height: 5\' 5"  (165.1 cm) Weight: 72.8 kg BMI (Calculated): 26.69   Intake/Output this shift:   Intake/Output Summary (Last 24 hours) at 08/26/2019 1029 Last data filed at 08/26/2019 7001 Gross per 24 hour  Intake 932.21 ml  Output 800 ml  Net 132.21 ml    Constitutional :  alert, cooperative, appears stated age and no distress  Respiratory:  clear to auscultation bilaterally  Cardiovascular:  regular rate and rhythm  Gastrointestinal: Soft, no guarding, touring abdomen with some tympany noted especially in the epigastric area.  Incisions clean dry and intact otherwise, and nontender to  palpation.   Skin: Cool and moist.   Psychiatric: Normal affect, non-agitated, not confused       LABS:  CMP Latest Ref Rng & Units 08/26/2019 08/25/2019 08/24/2019  Glucose 70 - 99 mg/dL 121(H) 160(H) 133(H)  BUN 8 - 23 mg/dL 23 22 19   Creatinine 0.61 - 1.24 mg/dL 1.02 1.43(H) 0.97  Sodium 135 - 145 mmol/L 138 140 138  Potassium 3.5 - 5.1 mmol/L 3.5 3.5 3.9  Chloride 98 - 111 mmol/L 104 103 107  CO2 22 - 32 mmol/L 26 25 23   Calcium 8.9 - 10.3 mg/dL 9.0 9.2 8.8(L)  Total Protein 6.5 - 8.1 g/dL 6.8 - -  Total Bilirubin 0.3 - 1.2 mg/dL 0.4 - -  Alkaline Phos 38 - 126 U/L 48 - -  AST 15 - 41 U/L 23 - -  ALT 0 - 44 U/L 41 - -   CBC Latest Ref Rng & Units 08/26/2019 08/25/2019 08/24/2019  WBC 4.0 - 10.5 K/uL 11.0(H) 9.3 7.0  Hemoglobin 13.0 - 17.0 g/dL 11.1(L) 11.6(L) 10.9(L)  Hematocrit 39.0 - 52.0 % 34.9(L) 36.5(L) 34.1(L)  Platelets 150 - 400 K/uL 381 447(H) 388    RADS: CLINICAL DATA:  History of RIGHT hemicolectomy and concurrent prostatectomy. Nausea vomiting, suspicious for ileus.  EXAM: CT ABDOMEN AND PELVIS WITH CONTRAST  TECHNIQUE: Multidetector CT imaging of the abdomen and pelvis was performed using the standard protocol following bolus administration of intravenous contrast.  CONTRAST:  153mL OMNIPAQUE IOHEXOL 300 MG/ML  SOLN  COMPARISON:  Preoperative assessment 07/31/2019  FINDINGS: Lower chest: Basilar atelectasis. No dense consolidation or evidence of pleural effusion. Central venous access device  terminates in the upper RIGHT atrium.  Hepatobiliary: Portal vein is patent into the liver. Post cholecystectomy with mild intra and extrahepatic biliary ductal distension. No suspicious focal hepatic abnormality.  Pancreas: No ductal dilation, inflammation or lesion.  Spleen: Spleen normal size without focal lesion.  Adrenals/Urinary Tract: Adrenal glands are normal.  Patchy hypoattenuation in the renal parenchyma bilaterally compatible with striated  nephrogram, more focal areas in the LEFT upper pole laterally and in the anterior interpolar RIGHT kidney. There is no sign of hydronephrosis. Foley catheter present in the urinary bladder.  Postcontrast density greater than 52 Hounsfield units of a lesion arising from the upper pole the RIGHT kidney, well-circumscribed measuring 2.5 x 2.1 cm unchanged from recent imaging.  Stomach/Bowel: Global distension of small bowel. Ingested positive enteric contrast passes into the jejunum. There is gradual dilution of contrast material as it passes more distally. A gradual transition is noted proximal to the ileocecal anastomosis where there is mild thickening of the anastomotic site. No Peri anastomotic fluid or free air. Fluid filled colon becomes less distended distally  Vascular/Lymphatic: Calcific and noncalcific atheromatous plaque in the abdominal aorta. No aneurysmal dilation. No adenopathy in the upper abdomen or in the retroperitoneum.  No evidence of pelvic adenopathy. Along the LEFT external iliac chain is a 4.4 x 2.1 cm area of fluid density without peripheral enhancement. Less well-defined RIGHT pelvic sidewall fluid density is noted as well as fluid density in the space of Retzius anterior to the urinary bladder following prostatectomy and pelvic lymphadenectomy.  Reproductive: Post prostatectomy with pelvic fluid as described. Fluid tracking anteriorly into the extraperitoneal spaces of the pelvis measuring approximately 2.3 cm greatest thickness extending cephalad approximately 5.5 cm without peripheral enhancement.  Other: No intraperitoneal fluid collection.  Musculoskeletal: Spinal degenerative changes. No acute or destructive bone finding.  IMPRESSION: 1. Patchy hypoattenuation in the renal parenchyma bilaterally compatible with striated nephrogram, more focal areas in the LEFT upper pole laterally and in the anterior interpolar RIGHT kidney. Findings are  concerning for pyelonephritis. Correlation with urine studies 2. Small bowel distension with gradual transition, developing small bowel obstruction versus is marked postoperative ileus. Attention on follow-up is suggested. 3. Extraperitoneal fluid in the pelvis may represent a mixture of postoperative change and developing lymphocele particularly along the LEFT external iliac chain. Strictly speaking extraperitoneal vesicourethral anastomotic leak is not excluded. However, findings remain nonspecific and potentially in the range of normal postoperative findings. Consider nonemergent cystogram prior to catheter removal as clinically warranted. 4. Intermediate density lesion associated with upper pole RIGHT kidney. Solid lesion versus hemorrhagic or proteinaceous cyst is considered. Follow-up renal protocol CT is suggested on a nonemergent basis for specific assessment of this finding to exclude renal neoplasm. 5. Post cholecystectomy with mild intra and extrahepatic biliary ductal distension. 6. Aortic atherosclerosis.  These results will be called to the ordering clinician or representative by the Radiologist Assistant, and communication documented in the PACS or [employer].  Aortic Atherosclerosis (ICD10-I70.0).   Electronically Signed   By: Zetta Bills M.D.   On: 08/25/2019 14:46  Assessment:   persistent prolonged post-surgical ileus  s/p robotic assisted laparoscopic right hemicolectomy and radical prostatectomy with pelvic lymph node dissection (Dr Erlene Quan) for prostate cancer.  With recent CT image of persistently dilated small bowel, as well as distention and persistent hiccups noted on exam this morning, will continue n.p.o. for 1 more day to be absolutely sure ileus is resolving.  Will obtain urinalysis to further investigate possible pyelonephritis as  well, could potentially explain the slight increase in leukocytosis this morning as well.  Gum and hard  candy okay for now.  We will continue TPN until we can resume a diet

## 2019-08-26 NOTE — Progress Notes (Signed)
Patient's abdomen noted to have increased firmness from yesterday more so on the left side with hypoactive bowel sounds.  LUQ noted to be distended.  MD notified and awaiting further instruction.

## 2019-08-26 NOTE — Plan of Care (Signed)
Continuing with plan of care. 

## 2019-08-27 LAB — MAGNESIUM: Magnesium: 2.1 mg/dL (ref 1.7–2.4)

## 2019-08-27 LAB — CBC WITH DIFFERENTIAL/PLATELET
Abs Immature Granulocytes: 0.68 10*3/uL — ABNORMAL HIGH (ref 0.00–0.07)
Basophils Absolute: 0.2 10*3/uL — ABNORMAL HIGH (ref 0.0–0.1)
Basophils Relative: 1 %
Eosinophils Absolute: 0.4 10*3/uL (ref 0.0–0.5)
Eosinophils Relative: 3 %
HCT: 35.4 % — ABNORMAL LOW (ref 39.0–52.0)
Hemoglobin: 11.5 g/dL — ABNORMAL LOW (ref 13.0–17.0)
Immature Granulocytes: 6 %
Lymphocytes Relative: 22 %
Lymphs Abs: 2.8 10*3/uL (ref 0.7–4.0)
MCH: 24.2 pg — ABNORMAL LOW (ref 26.0–34.0)
MCHC: 32.5 g/dL (ref 30.0–36.0)
MCV: 74.4 fL — ABNORMAL LOW (ref 80.0–100.0)
Monocytes Absolute: 1.4 10*3/uL — ABNORMAL HIGH (ref 0.1–1.0)
Monocytes Relative: 11 %
Neutro Abs: 7.1 10*3/uL (ref 1.7–7.7)
Neutrophils Relative %: 57 %
Platelets: 418 10*3/uL — ABNORMAL HIGH (ref 150–400)
RBC: 4.76 MIL/uL (ref 4.22–5.81)
RDW: 14.4 % (ref 11.5–15.5)
Smear Review: NORMAL
WBC: 12.4 10*3/uL — ABNORMAL HIGH (ref 4.0–10.5)
nRBC: 0 % (ref 0.0–0.2)

## 2019-08-27 LAB — GLUCOSE, CAPILLARY
Glucose-Capillary: 120 mg/dL — ABNORMAL HIGH (ref 70–99)
Glucose-Capillary: 123 mg/dL — ABNORMAL HIGH (ref 70–99)
Glucose-Capillary: 129 mg/dL — ABNORMAL HIGH (ref 70–99)
Glucose-Capillary: 96 mg/dL (ref 70–99)

## 2019-08-27 LAB — BASIC METABOLIC PANEL
Anion gap: 9 (ref 5–15)
BUN: 23 mg/dL (ref 8–23)
CO2: 22 mmol/L (ref 22–32)
Calcium: 9.1 mg/dL (ref 8.9–10.3)
Chloride: 106 mmol/L (ref 98–111)
Creatinine, Ser: 1.08 mg/dL (ref 0.61–1.24)
GFR calc Af Amer: >60 mL/min (ref >60)
GFR calc non Af Amer: >60 mL/min (ref >60)
Glucose, Bld: 126 mg/dL — ABNORMAL HIGH (ref 70–99)
Potassium: 4.2 mmol/L (ref 3.5–5.1)
Sodium: 137 mmol/L (ref 135–145)

## 2019-08-27 LAB — PHOSPHORUS: Phosphorus: 3.9 mg/dL (ref 2.5–4.6)

## 2019-08-27 MED ORDER — TRACE MINERALS CU-MN-SE-ZN 300-55-60-3000 MCG/ML IV SOLN
INTRAVENOUS | Status: DC
  Filled 2019-08-27: qty 1992

## 2019-08-27 MED ORDER — TRACE MINERALS CU-MN-SE-ZN 300-55-60-3000 MCG/ML IV SOLN
INTRAVENOUS | Status: DC
  Filled 2019-08-27 (×3): qty 960

## 2019-08-27 MED ORDER — TRACE MINERALS CU-MN-SE-ZN 300-55-60-3000 MCG/ML IV SOLN
INTRAVENOUS | Status: AC
  Filled 2019-08-27: qty 960

## 2019-08-27 MED ORDER — ACETAMINOPHEN 325 MG PO TABS: 650.0000 mg | ORAL_TABLET | Freq: Four times a day (QID) | ORAL | Status: DC | PRN

## 2019-08-27 MED ORDER — FAT EMULSION PLANT BASED 20 % IV EMUL
250.0000 mL | INTRAVENOUS | Status: AC
  Administered 2019-08-27: 250 mL via INTRAVENOUS
  Filled 2019-08-27: qty 250

## 2019-08-27 NOTE — Progress Notes (Signed)
Urology Inpatient Progress Report  S/P right colectomy [Z90.49]  Procedure(s): XI ROBOT ASSISTED LAPAROSCOPIC PARTIAL COLECTOMY, Right XI ROBOTIC ASSISTED LAPAROSCOPIC PROSTATECTOMY, BILATERAL LYMPH NODE DISSECTION INDOCYANINE GREEN FLUORESCENCE IMAGING (ICG)  10 Days Post-Op   Intv/Subj: No acute events overnight. Patient is without complaint. He does have some mild leukocytosis.  White blood cell count is 12.4 from 11.  However, he is afebrile with vital signs stable.  Nursing only recorded 2 cc of urine last night but this is incorrect.  Creatinine is okay.  His diet is being advanced to clear liquids.  Active Problems:   S/P right colectomy  Current Facility-Administered Medications  Medication Dose Route Frequency Provider Last Rate Last Admin  . Marland KitchenTPN (CLINIMIX-E) Adult   Intravenous Continuous TPN Pernell Dupre, RPH 83 mL/hr at 08/27/19 1200 Rate Verify at 08/27/19 1200  . Marland KitchenTPN (CLINIMIX-E) Adult   Intravenous Continuous TPN Hallaji, Sheema M, RPH      . 0.9 %  sodium chloride infusion   Intravenous Continuous Pabon, Diego F, MD 20 mL/hr at 08/27/19 1200 Rate Verify at 08/27/19 1200  . acetaminophen (TYLENOL) tablet 650 mg  650 mg Oral Q6H PRN Sakai, Isami, DO      . Chlorhexidine Gluconate Cloth 2 % PADS 6 each  6 each Topical Daily Pabon, [place] F, MD   6 each at 08/26/19 1000  . diphenhydrAMINE (BENADRYL) 12.5 MG/5ML elixir 12.5 mg  12.5 mg Oral Q6H PRN Pabon, Diego F, MD       Or  . diphenhydrAMINE (BENADRYL) injection 12.5 mg  12.5 mg Intravenous Q6H PRN Pabon, Diego F, MD      . enoxaparin (LOVENOX) injection 40 mg  40 mg Subcutaneous Q24H Pabon, Diego F, MD   40 mg at 08/27/19 0803  . fat emul(INTRAlipid) 20 % infusion 250 mL  250 mL Intravenous Continuous TPN Hallaji, Sheema M, RPH      . guaiFENesin (ROBITUSSIN) 100 MG/5ML solution 100 mg  5 mL Oral Q4H PRN Herbert Pun, MD   100 mg at 08/18/19 0612  . insulin aspart (novoLOG) injection 0-9 Units  0-9 Units  Subcutaneous Q6H Dallie Piles, RPH   1 Units at 08/27/19 0555  . levothyroxine (SYNTHROID) tablet 150 mcg  150 mcg Oral Daily Pabon, [place] F, MD   150 mcg at 08/27/19 0554  . morphine 2 MG/ML injection 2 mg  2 mg Intravenous Q3H PRN Jules Husbands, MD   2 mg at 08/22/19 2024  . ondansetron (ZOFRAN-ODT) disintegrating tablet 4 mg  4 mg Oral Q6H PRN Pabon, Diego F, MD       Or  . ondansetron (ZOFRAN) injection 4 mg  4 mg Intravenous Q6H PRN Pabon, Diego F, MD   4 mg at 08/24/19 1534  . oxybutynin (DITROPAN) tablet 5 mg  5 mg Oral Q8H PRN Vaillancourt, Samantha, PA-C      . oxyCODONE (Oxy IR/ROXICODONE) immediate release tablet 5-10 mg  5-10 mg Oral Q4H PRN Pabon, Diego F, MD   10 mg at 08/22/19 1302  . phenol (CHLORASEPTIC) mouth spray 1 spray  1 spray Mouth/Throat PRN Jules Husbands, MD   1 spray at 08/25/19 0052  . prochlorperazine (COMPAZINE) tablet 10 mg  10 mg Oral Q6H PRN Pabon, Diego F, MD       Or  . prochlorperazine (COMPAZINE) injection 5-10 mg  5-10 mg Intravenous Q6H PRN Caroleen Hamman F, MD   10 mg at 08/22/19 2024  . sodium chloride flush (NS) 0.9 %  injection 10-40 mL  10-40 mL Intracatheter Q12H Pabon, [place] F, MD   10 mL at 08/27/19 0804  . sodium chloride flush (NS) 0.9 % injection 10-40 mL  10-40 mL Intracatheter PRN Jules Husbands, MD         Objective: Vital: Vitals:   08/26/19 1700 08/26/19 2030 08/27/19 0515 08/27/19 1200  BP: (!) 128/91 (!) 135/94 122/72 139/78  Pulse: 98 (!) 101 (!) 104 98  Resp:  20 18 16   Temp:  99.2 F (37.3 C) 98.8 F (37.1 C) 99.1 F (37.3 C)  TempSrc:  Oral Oral Oral  SpO2:  99% 99% 100%  Weight:      Height:       I/Os: I/O last 3 completed shifts: In: 2428.1 [P.O.:240; I.V.:2188.1] Out: 68 [Urine:677; Stool:2]  Physical Exam:  General: Patient is in no apparent distress Lungs: Normal respiratory effort, chest expands symmetrically. GI: Incisions are c/d/i.The abdomen is soft, mildly distended, and nontender without  mass. Foley: Draining clear yellow urine Ext: lower extremities symmetric  Lab Results: Recent Labs    08/25/19 0523 08/26/19 0549 08/27/19 0601  WBC 9.3 11.0* 12.4*  HGB 11.6* 11.1* 11.5*  HCT 36.5* 34.9* 35.4*   Recent Labs    08/25/19 0523 08/26/19 0549 08/27/19 0601  NA 140 138 137  K 3.5 3.5 4.2  CL 103 104 106  CO2 25 26 22   GLUCOSE 160* 121* 126*  BUN 22 23 23   CREATININE 1.43* 1.02 1.08  CALCIUM 9.2 9.0 9.1   No results for input(s): LABPT, INR in the last 72 hours. No results for input(s): LABURIN in the last 72 hours. Results for orders placed or performed during the hospital encounter of 08/15/19  SARS CORONAVIRUS 2 (TAT 6-24 HRS) Nasopharyngeal Nasopharyngeal Swab     Status: None   Collection Time: 08/15/19 10:37 AM   Specimen: Nasopharyngeal Swab  Result Value Ref Range Status   SARS Coronavirus 2 NEGATIVE NEGATIVE Final    Comment: (NOTE) SARS-CoV-2 target nucleic acids are NOT DETECTED. The SARS-CoV-2 RNA is generally detectable in upper and lower respiratory specimens during the acute phase of infection. Negative results do not preclude SARS-CoV-2 infection, do not rule out co-infections with other pathogens, and should not be used as the sole basis for treatment or other patient management decisions. Negative results must be combined with clinical observations, patient history, and epidemiological information. The expected result is Negative. Fact Sheet for Patients: SugarRoll.be Fact Sheet for Healthcare Providers: https://www.woods-mathews.com/ This test is not yet approved or cleared by the [place] FDA and  has been authorized for detection and/or diagnosis of SARS-CoV-2 by FDA under an Emergency Use Authorization (EUA). This EUA will remain  in effect (meaning this test can be used) for the duration of the COVID-19 declaration under Section 56 4(b)(1) of the Act, 21 U.S.C. section  360bbb-3(b)(1), unless the authorization is terminated or revoked sooner. Performed at [facility], [place] [address], [place], [place] [address]     Studies/Results: CT ABDOMEN PELVIS W CONTRAST  Result Date: 08/25/2019 CLINICAL DATA:  History of RIGHT hemicolectomy and concurrent prostatectomy. Nausea vomiting, suspicious for ileus. EXAM: CT ABDOMEN AND PELVIS WITH CONTRAST TECHNIQUE: Multidetector CT imaging of the abdomen and pelvis was performed using the standard protocol following bolus administration of intravenous contrast. CONTRAST:  139mL OMNIPAQUE IOHEXOL 300 MG/ML  SOLN COMPARISON:  Preoperative assessment 07/31/2019 FINDINGS: Lower chest: Basilar atelectasis. No dense consolidation or evidence of pleural effusion. Central venous access device  terminates in the upper RIGHT atrium. Hepatobiliary: Portal vein is patent into the liver. Post cholecystectomy with mild intra and extrahepatic biliary ductal distension. No suspicious focal hepatic abnormality. Pancreas: No ductal dilation, inflammation or lesion. Spleen: Spleen normal size without focal lesion. Adrenals/Urinary Tract: Adrenal glands are normal. Patchy hypoattenuation in the renal parenchyma bilaterally compatible with striated nephrogram, more focal areas in the LEFT upper pole laterally and in the anterior interpolar RIGHT kidney. There is no sign of hydronephrosis. Foley catheter present in the urinary bladder. Postcontrast density greater than 52 Hounsfield units of a lesion arising from the upper pole the RIGHT kidney, well-circumscribed measuring 2.5 x 2.1 cm unchanged from recent imaging. Stomach/Bowel: Global distension of small bowel. Ingested positive enteric contrast passes into the jejunum. There is gradual dilution of contrast material as it passes more distally. A gradual transition is noted proximal to the ileocecal anastomosis where there is mild thickening of the anastomotic site. No Peri anastomotic fluid or free  air. Fluid filled colon becomes less distended distally Vascular/Lymphatic: Calcific and noncalcific atheromatous plaque in the abdominal aorta. No aneurysmal dilation. No adenopathy in the upper abdomen or in the retroperitoneum. No evidence of pelvic adenopathy. Along the LEFT external iliac chain is a 4.4 x 2.1 cm area of fluid density without peripheral enhancement. Less well-defined RIGHT pelvic sidewall fluid density is noted as well as fluid density in the space of Retzius anterior to the urinary bladder following prostatectomy and pelvic lymphadenectomy. Reproductive: Post prostatectomy with pelvic fluid as described. Fluid tracking anteriorly into the extraperitoneal spaces of the pelvis measuring approximately 2.3 cm greatest thickness extending cephalad approximately 5.5 cm without peripheral enhancement. Other: No intraperitoneal fluid collection. Musculoskeletal: Spinal degenerative changes. No acute or destructive bone finding. IMPRESSION: 1. Patchy hypoattenuation in the renal parenchyma bilaterally compatible with striated nephrogram, more focal areas in the LEFT upper pole laterally and in the anterior interpolar RIGHT kidney. Findings are concerning for pyelonephritis. Correlation with urine studies 2. Small bowel distension with gradual transition, developing small bowel obstruction versus is marked postoperative ileus. Attention on follow-up is suggested. 3. Extraperitoneal fluid in the pelvis may represent a mixture of postoperative change and developing lymphocele particularly along the LEFT external iliac chain. Strictly speaking extraperitoneal vesicourethral anastomotic leak is not excluded. However, findings remain nonspecific and potentially in the range of normal postoperative findings. Consider nonemergent cystogram prior to catheter removal as clinically warranted. 4. Intermediate density lesion associated with upper pole RIGHT kidney. Solid lesion versus hemorrhagic or proteinaceous  cyst is considered. Follow-up renal protocol CT is suggested on a nonemergent basis for specific assessment of this finding to exclude renal neoplasm. 5. Post cholecystectomy with mild intra and extrahepatic biliary ductal distension. 6. Aortic atherosclerosis. These results will be called to the ordering clinician or representative by the Radiologist Assistant, and communication documented in the PACS or [employer]. Aortic Atherosclerosis (ICD10-I70.0). Electronically Signed   By: Zetta Bills M.D.   On: 08/25/2019 14:46    Assessment: Prostate cancer Postoperative ileus, improving  Procedure(s): XI ROBOT ASSISTED LAPAROSCOPIC PARTIAL COLECTOMY, Right XI ROBOTIC ASSISTED LAPAROSCOPIC PROSTATECTOMY, BILATERAL LYMPH NODE DISSECTION INDOCYANINE GREEN FLUORESCENCE IMAGING (ICG), 10 Days Post-Op  doing well.  Plan: Patient does have mild leukocytosis.  However, he is asymptomatic from a urinary standpoint.  Low suspicion for catheter associated UTI.  I do not see a need for urine culture at this time as it would likely show colonization with bacteria since his Foley catheter has been in since surgery  And since he is asymptomatic, this does not need to be treated.  Continue to follow white blood cell count.  Agree with advancing diet.  Continue Foley catheter until at least 6/8.   Link Snuffer, MD Urology 08/27/2019, 12:35 PM

## 2019-08-27 NOTE — Consult Note (Addendum)
PHARMACY - TOTAL PARENTERAL NUTRITION CONSULT NOTE   Indication: High Risk for Malnutrition  Patient Measurements: Height: 5\' 5"  (165.1 cm) Weight: 72.8 kg (160 lb 6.4 oz) IBW/kg (Calculated) : 61.5 TPN AdjBW (KG): 66 Body mass index is 26.69 kg/m.  Assessment: 64 y.o. male with persistent post-operative ileus awaiting return of bowel function 8 Days Post-Op s/p robotic assisted laparoscopic right hemicolectomy and radical prostatectomy with pelvic lymph node dissection for prostate cancer  Glucose / Insulin: no h/o DM, sSSI (2units in 24h) CBG: 96-129.  Electrolytes: WNL Renal: Scr 1.24-->0.82-->1.12-->0.97>1.43>>1.02 LFTs / TGs: TG 105 at baseline Prealbumin / albumin: 16.4/3.2 Intake / Output; MIVF:  NS@20ml /hr GI Imaging:multiple loops of dilated small bowel consistent with either persistent ileus or a degree of bowel obstruction. No free air evident. Clips noted right upper quadrant.  Surgeries / Procedures: no new surgeries since starting TPN  Central access: 08/23/19 TPN start date: 08/23/19  Nutritional Goals (per RD recommendation on 08/23/19): kCal/day: 2000 - 3000, Protein: 100-115 g/day, Fluid: >2300 mL/day Goal TPN rate is 83 mL/hr (provides 100 g of protein and 2233 kcals per day)  Current Nutrition:  Clear liquids  Plan:   Starting clear liquid diet and start weaning TPN today.   Per RD- Clinimix E 5/20 TPN @ 40 mL/hr at 1800 + 20% ILE at 20 mL/hr x 12 hrs  Electrolytes in TPN: standard  Add standard MVI and trace elements to TPN  continue Sensitive q6h SSI and adjust as needed   Continue MIVF to 20 mL/hr at 1800  Monitor TPN labs on Mon/Thurs and more often if clinically appropriate  Labs are wnl. Continue to monitor Mon/Thurs   Pernell Dupre, PharmD, BCPS Clinical Pharmacist 08/27/2019 10:36 AM

## 2019-08-27 NOTE — Plan of Care (Signed)
Continuing with plan of care. 

## 2019-08-27 NOTE — Progress Notes (Signed)
Subjective:  CC: Tony Hernandez is a 64 y.o. male  Hospital stay day 10, 10 Days Post-Op persistent prolonged post-surgical ileus  s/p robotic assisted laparoscopic right hemicolectomy and radical prostatectomy with pelvic lymph node dissection (Dr Erlene Quan) for prostate cancer  HPI: No acute issues overnight.  Hiccups resolving, states he had more BMs  ROS:  General: Denies weight loss, weight gain, fatigue, fevers, chills, and night sweats. Heart: Denies chest pain, palpitations, racing heart, irregular heartbeat, leg pain or swelling, and decreased activity tolerance. Respiratory: Denies breathing difficulty, shortness of breath, wheezing, cough, and sputum. GI: Denies change in appetite, heartburn, nausea, vomiting, constipation, diarrhea, and blood in stool. GU: Denies difficulty urinating, pain with urinating, urgency, frequency, blood in urine.   Objective:   Temp:  [98 F (36.7 C)-99.2 F (37.3 C)] 98.8 F (37.1 C) (06/06 0515) Pulse Rate:  [98-106] 104 (06/06 0515) Resp:  [14-20] 18 (06/06 0515) BP: (122-135)/(72-101) 122/72 (06/06 0515) SpO2:  [99 %-100 %] 99 % (06/06 0515)     Height: 5\' 5"  (165.1 cm) Weight: 72.8 kg BMI (Calculated): 26.69   Intake/Output this shift:   Intake/Output Summary (Last 24 hours) at 08/27/2019 1053 Last data filed at 08/27/2019 0100 Gross per 24 hour  Intake 2428.07 ml  Output 429 ml  Net 1999.07 ml    Constitutional :  alert, cooperative, appears stated age and no distress  Respiratory:  clear to auscultation bilaterally  Cardiovascular:  regular rate and rhythm  Gastrointestinal: Soft, no guarding, abdomen still distended with slight tympany, but no TTP. Incisions clean dry and intact.   Skin: Cool and moist.   Psychiatric: Normal affect, non-agitated, not confused       LABS:  CMP Latest Ref Rng & Units 08/27/2019 08/26/2019 08/25/2019  Glucose 70 - 99 mg/dL 126(H) 121(H) 160(H)  BUN 8 - 23 mg/dL 23 23 22   Creatinine 0.61 - 1.24 mg/dL 1.08  1.02 1.43(H)  Sodium 135 - 145 mmol/L 137 138 140  Potassium 3.5 - 5.1 mmol/L 4.2 3.5 3.5  Chloride 98 - 111 mmol/L 106 104 103  CO2 22 - 32 mmol/L 22 26 25   Calcium 8.9 - 10.3 mg/dL 9.1 9.0 9.2  Total Protein 6.5 - 8.1 g/dL - 6.8 -  Total Bilirubin 0.3 - 1.2 mg/dL - 0.4 -  Alkaline Phos 38 - 126 U/L - 48 -  AST 15 - 41 U/L - 23 -  ALT 0 - 44 U/L - 41 -   CBC Latest Ref Rng & Units 08/27/2019 08/26/2019 08/25/2019  WBC 4.0 - 10.5 K/uL 12.4(H) 11.0(H) 9.3  Hemoglobin 13.0 - 17.0 g/dL 11.5(L) 11.1(L) 11.6(L)  Hematocrit 39.0 - 52.0 % 35.4(L) 34.9(L) 36.5(L)  Platelets 150 - 400 K/uL 418(H) 381 447(H)    RADS: N/a  Assessment:   persistent prolonged post-surgical ileus  s/p robotic assisted laparoscopic right hemicolectomy and radical prostatectomy with pelvic lymph node dissection (Dr Erlene Quan) for prostate cancer.  Doing well today with no change in exam, improving hiccups and recorded BMs.  Will resume diet and start weaning TPN.  Discussed leukocytosis with Dr. Gloriann Loan, he believes nothing to address as long as he is asymptomatic from urology standpoint.  No obvious source of the leukocytosis and absence of fever, so will just monitor for now.

## 2019-08-28 ENCOUNTER — Inpatient Hospital Stay: Payer: 59

## 2019-08-28 ENCOUNTER — Telehealth: Payer: Self-pay | Admitting: Urology

## 2019-08-28 DIAGNOSIS — C61 Malignant neoplasm of prostate: Secondary | ICD-10-CM

## 2019-08-28 LAB — CBC
HCT: 34.9 % — ABNORMAL LOW (ref 39.0–52.0)
Hemoglobin: 11.5 g/dL — ABNORMAL LOW (ref 13.0–17.0)
MCH: 24.6 pg — ABNORMAL LOW (ref 26.0–34.0)
MCHC: 33 g/dL (ref 30.0–36.0)
MCV: 74.7 fL — ABNORMAL LOW (ref 80.0–100.0)
Platelets: 395 10*3/uL (ref 150–400)
RBC: 4.67 MIL/uL (ref 4.22–5.81)
RDW: 14.6 % (ref 11.5–15.5)
WBC: 10.4 10*3/uL (ref 4.0–10.5)
nRBC: 0 % (ref 0.0–0.2)

## 2019-08-28 LAB — GLUCOSE, CAPILLARY
Glucose-Capillary: 123 mg/dL — ABNORMAL HIGH (ref 70–99)
Glucose-Capillary: 125 mg/dL — ABNORMAL HIGH (ref 70–99)
Glucose-Capillary: 125 mg/dL — ABNORMAL HIGH (ref 70–99)
Glucose-Capillary: 134 mg/dL — ABNORMAL HIGH (ref 70–99)

## 2019-08-28 MED ORDER — FAT EMULSION PLANT BASED 20 % IV EMUL
250.0000 mL | INTRAVENOUS | Status: DC
  Administered 2019-08-28: 250 mL via INTRAVENOUS
  Filled 2019-08-28: qty 250

## 2019-08-28 MED ORDER — ENSURE ENLIVE PO LIQD
237.0000 mL | Freq: Two times a day (BID) | ORAL | Status: DC
  Administered 2019-08-28 – 2019-08-30 (×5): 237 mL via ORAL

## 2019-08-28 MED ORDER — TRACE MINERALS CU-MN-SE-ZN 300-55-60-3000 MCG/ML IV SOLN
INTRAVENOUS | Status: DC
  Filled 2019-08-28: qty 960

## 2019-08-28 MED ORDER — SODIUM CHLORIDE 0.9 % IV SOLN
1.0000 g | INTRAVENOUS | Status: DC
  Administered 2019-08-28: 1 g via INTRAVENOUS
  Filled 2019-08-28: qty 1

## 2019-08-28 NOTE — Progress Notes (Addendum)
Urology Inpatient Progress Note  ADDENDUM 12:30PM: Cystogram completed; small anastomotic leak. Continue Foley.  Subjective: Tony Hernandez is a 64 y.o. male admitted on 08/17/2019 for scheduled robotic assisted laparoscopic radical prostatectomy, bilateral nerve sparing, with bilateral pelvic lymph node dissection with Dr. 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 Dahlia Byes for management of large ascending colon with high-grade dysplasia.  Postoperative course complicated by ileus, resolving.  Patient on TPN.  Foley catheter extended due to concern for poor wound healing secondary to extended n.p.o. status for management of postoperative ileus; had planned for possible removal tomorrow, 6/8.  Patient noted to have leukocytosis over the weekend; empiric ceftriaxone started this morning. WBC count is down today, 10.4.  Foley catheter remains in place today draining yellow urine.  Today, patient reports feeling well.  He continues to have bowel movements and is passing flatus.  He denies low back or lower abdominal pain and is tolerating his Foley catheter well.  No acute concerns.  Anti-infectives: Anti-infectives (From admission, onward)   Start     Dose/Rate Route Frequency Ordered Stop   08/28/19 0900  cefTRIAXone (ROCEPHIN) 1 g in sodium chloride 0.9 % 100 mL IVPB     1 g 200 mL/hr over 30 Minutes Intravenous Every 24 hours 08/28/19 0757     08/18/19 0600  cefoTEtan (CEFOTAN) 2 g in sodium chloride 0.9 % 100 mL IVPB     2 g 200 mL/hr over 30 Minutes Intravenous On call to O.R. 08/17/19 1700 08/19/19 0559   08/17/19 1130  cefoTEtan (CEFOTAN) 2 g in sodium chloride 0.9 % 100 mL IVPB  Status:  Discontinued     2 g 200 mL/hr over 30 Minutes Intravenous  Once 08/17/19 1122 08/17/19 1655   08/17/19 0629  sodium chloride 0.9 % with cefoTEtan (CEFOTAN) ADS Med    Note to  Pharmacy: Trudie Reed   : cabinet override      08/17/19 0629 08/17/19 0756   08/17/19 0600  cefoTEtan (CEFOTAN) 2 g in sodium chloride 0.9 % 100 mL IVPB     2 g 200 mL/hr over 30 Minutes Intravenous On call to O.R. 08/16/19 2211 08/17/19 0816      Current Facility-Administered Medications  Medication Dose Route Frequency Provider Last Rate Last Admin  . Marland KitchenTPN (CLINIMIX-E) Adult   Intravenous Continuous TPN Pabon, Diego F, MD 40 mL/hr at 08/28/19 0600 Rate Verify at 08/28/19 0600  . 0.9 %  sodium chloride infusion   Intravenous Continuous Pabon, Diego F, MD 20 mL/hr at 08/28/19 0600 Rate Verify at 08/28/19 0600  . acetaminophen (TYLENOL) tablet 650 mg  650 mg Oral Q6H PRN Sakai, Isami, DO      . cefTRIAXone (ROCEPHIN) 1 g in sodium chloride 0.9 % 100 mL IVPB  1 g Intravenous Q24H Tylene Fantasia, PA-C      . Chlorhexidine Gluconate Cloth 2 % PADS 6 each  6 each Topical Daily Pabon, [place] F, MD   6 each at 08/27/19 1000  . diphenhydrAMINE (BENADRYL) 12.5 MG/5ML elixir 12.5 mg  12.5 mg Oral Q6H PRN Pabon, Diego F, MD       Or  . diphenhydrAMINE (BENADRYL) injection 12.5 mg  12.5 mg Intravenous Q6H PRN Pabon, Diego F, MD      . enoxaparin (LOVENOX) injection 40 mg  40 mg Subcutaneous Q24H Pabon, Diego F, MD   40 mg at 08/28/19 0803  .  guaiFENesin (ROBITUSSIN) 100 MG/5ML solution 100 mg  5 mL Oral Q4H PRN Herbert Pun, MD   100 mg at 08/18/19 0612  . insulin aspart (novoLOG) injection 0-9 Units  0-9 Units Subcutaneous Q6H Dallie Piles, RPH   1 Units at 08/28/19 5009  . levothyroxine (SYNTHROID) tablet 150 mcg  150 mcg Oral Daily Pabon, [place] F, MD   150 mcg at 08/28/19 0718  . morphine 2 MG/ML injection 2 mg  2 mg Intravenous Q3H PRN Jules Husbands, MD   2 mg at 08/22/19 2024  . ondansetron (ZOFRAN-ODT) disintegrating tablet 4 mg  4 mg Oral Q6H PRN Pabon, Diego F, MD       Or  . ondansetron (ZOFRAN) injection 4 mg  4 mg Intravenous Q6H PRN Pabon, Diego F, MD   4 mg at 08/24/19 1534   . oxybutynin (DITROPAN) tablet 5 mg  5 mg Oral Q8H PRN Takyia Sindt, PA-C      . oxyCODONE (Oxy IR/ROXICODONE) immediate release tablet 5-10 mg  5-10 mg Oral Q4H PRN Pabon, Diego F, MD   10 mg at 08/22/19 1302  . phenol (CHLORASEPTIC) mouth spray 1 spray  1 spray Mouth/Throat PRN Jules Husbands, MD   1 spray at 08/25/19 0052  . prochlorperazine (COMPAZINE) tablet 10 mg  10 mg Oral Q6H PRN Pabon, Diego F, MD       Or  . prochlorperazine (COMPAZINE) injection 5-10 mg  5-10 mg Intravenous Q6H PRN Caroleen Hamman F, MD   10 mg at 08/22/19 2024  . sodium chloride flush (NS) 0.9 % injection 10-40 mL  10-40 mL Intracatheter Q12H Pabon, Diego F, MD   10 mL at 08/27/19 0804  . sodium chloride flush (NS) 0.9 % injection 10-40 mL  10-40 mL Intracatheter PRN Pabon, Diego F, MD       Objective: Vital signs in last 24 hours: Temp:  [98.4 F (36.9 C)-99.4 F (37.4 C)] 98.4 F (36.9 C) (06/07 0438) Pulse Rate:  [98-115] 104 (06/07 0438) Resp:  [16] 16 (06/07 0438) BP: (109-139)/(74-78) 125/77 (06/07 0438) SpO2:  [94 %-100 %] 100 % (06/07 0438) Weight:  [74.6 kg] 74.6 kg (06/07 3818)  Intake/Output from previous day: 06/06 0701 - 06/07 0700 In: 3390.3 [I.V.:3390.3] Out: 1125 [Urine:1125] Intake/Output this shift: No intake/output data recorded.  Physical Exam Vitals and nursing note reviewed.  Constitutional:      General: He is not in acute distress.    Appearance: He is not ill-appearing, toxic-appearing or diaphoretic.  HENT:     Head: Normocephalic and atraumatic.  Pulmonary:     Effort: Pulmonary effort is normal. No respiratory distress.  Skin:    General: Skin is warm and dry.  Neurological:     Mental Status: He is alert and oriented to person, place, and time.  Psychiatric:        Mood and Affect: Mood normal.        Behavior: Behavior normal.    Lab Results:  Recent Labs    08/27/19 0601 08/28/19 0807  WBC 12.4* 10.4  HGB 11.5* 11.5*  HCT 35.4* 34.9*  PLT 418*  395   BMET Recent Labs    08/26/19 0549 08/27/19 0601  NA 138 137  K 3.5 4.2  CL 104 106  CO2 26 22  GLUCOSE 121* 126*  BUN 23 23  CREATININE 1.02 1.08  CALCIUM 9.0 9.1   Assessment & Plan: 64 year old male s/p combined robotic assisted laparoscopic radical prostatectomy with bilateral pelvic  lymph node dissection by urology as well as robotic assisted laparoscopic right colectomy with splenic flexure takedown by general surgery with postoperative course complicated by ileus, resolving, on TPN.  In consultation with Dr. Erlene Quan, we will proceed with cystogram today to evaluate anastomotic healing.  If no contrast leaks, will proceed with Foley catheter removal today.  As patient is already on empiric ceftriaxone, he will not need additional one-dose antibiotics with Foley discontinuation.  DG cystogram ordered; do not remove Foley catheter without urology orders to do so.  Debroah Loop, PA-C 08/28/2019

## 2019-08-28 NOTE — Progress Notes (Addendum)
[place] [facility] Day(s): 11.   Post op day(s): 11 Days Post-Op.   Interval History:  Patient seen and examined no acute events or new complaints overnight.  Patient reports he is feeling good this morning, no abdominal pain, distension improved No fever, chills, nausea, or emesis He has been intermittently tachycardic in the last 24 hours  Leukocytosis resolved, WBC 10.4 this morning No electrolyte derangement seen yesterday TPN weaned down, on FLD, tolerating well; + bowel fucntion Mobilizing well. Plan for urology evaluation for foley need today   Vital signs in last 24 hours: [min-max] current  Temp:  [98.4 F (36.9 C)-99.4 F (37.4 C)] 98.4 F (36.9 C) (06/07 0438) Pulse Rate:  [98-115] 104 (06/07 0438) Resp:  [16] 16 (06/07 0438) BP: (109-139)/(74-78) 125/77 (06/07 0438) SpO2:  [94 %-100 %] 100 % (06/07 0438) Weight:  [74.6 kg] 74.6 kg (06/07 4097)     Height: 5\' 5"  (165.1 cm) Weight: 74.6 kg BMI (Calculated): 27.37   Intake/Output last 2 shifts:  06/06 0701 - 06/07 0700 In: 3390.3 [I.V.:3390.3] Out: 1125 [Urine:1125]   Physical Exam:  Constitutional: alert, cooperative and no distress  Respiratory: breathing non-labored at rest  Cardiovascular: regular rate and sinus rhythm Gastrointestinal:Soft, non-tender, distension improved from yesterday, he remains tympanic, no rebound/guarding Genitourinary:Foley in place, no hematuria Integumentary:Laparoscopic incisions are CDI with dermabond, no erythema or drainage   Labs:  CBC Latest Ref Rng & Units 08/27/2019 08/26/2019 08/25/2019  WBC 4.0 - 10.5 K/uL 12.4(H) 11.0(H) 9.3  Hemoglobin 13.0 - 17.0 g/dL 11.5(L) 11.1(L) 11.6(L)  Hematocrit 39.0 - 52.0 % 35.4(L) 34.9(L) 36.5(L)  Platelets 150 - 400 K/uL 418(H) 381 447(H)   CMP Latest Ref Rng & Units 08/27/2019 08/26/2019 08/25/2019  Glucose 70 - 99 mg/dL 126(H) 121(H) 160(H)  BUN 8 - 23 mg/dL 23 23 22   Creatinine 0.61 - 1.24  mg/dL 1.08 1.02 1.43(H)  Sodium 135 - 145 mmol/L 137 138 140  Potassium 3.5 - 5.1 mmol/L 4.2 3.5 3.5  Chloride 98 - 111 mmol/L 106 104 103  CO2 22 - 32 mmol/L 22 26 25   Calcium 8.9 - 10.3 mg/dL 9.1 9.0 9.2  Total Protein 6.5 - 8.1 g/dL - 6.8 -  Total Bilirubin 0.3 - 1.2 mg/dL - 0.4 -  Alkaline Phos 38 - 126 U/L - 48 -  AST 15 - 41 U/L - 23 -  ALT 0 - 44 U/L - 41 -     Imaging studies: No new pertinent imaging studies   Assessment/Plan:  64 y.o. male with resolving post-surgical ileus 11 Days Post-Op s/p robotic assisted laparoscopic right hemicolectomy and radical prostatectomy with pelvic lymph node dissection (Dr Erlene Quan) for prostate cancer   - Advanced to full liquids this morning; possibly soft diet for dinner vs tomorrow morning  - continue TPN 1/2 rate for now; discontinue once tolerating soft diet and beyond   - pain control prn  - monitor abdominal examination; on-going bowel fucntion    - Monitor electrolytes/mutritional labs - Continue foley per urology; reviewed plans for cystogram and possible removal today, ABX given as prophylaxis   - Mobilization encouraged    All of the above findings and recommendations were discussed with the patient, and the medical team, and all of patient's questions were answered to his expressed satisfaction.  -- Edison Simon, PA-C [facility] 08/28/2019, 7:26 AM [phone] M-F: 7am - 4pm  Patient seen and examined with Mr. Freda Jackson.  Resolving ileus now  with a small contained urethral leak.  His abdomen is completely benign.  He has been eating some passing flatus and having bowel movement.  Per urology will have to keep the Foley for 1 more week or so given the leak.  Hopefully we can discharge him in the next 24 to 48 hours if he continues to improve

## 2019-08-28 NOTE — Telephone Encounter (Signed)
Please reschedule this patient for next week.  He needs a cystogram prior to his appointment with Sam for Foley removal (possible).  Hollice Espy, MD

## 2019-08-28 NOTE — Consult Note (Signed)
PHARMACY - TOTAL PARENTERAL NUTRITION CONSULT NOTE   Indication: High Risk for Malnutrition  Patient Measurements: Height: 5\' 5"  (165.1 cm) Weight: 74.6 kg (164 lb 7.4 oz) IBW/kg (Calculated) : 61.5 TPN AdjBW (KG): 66 Body mass index is 27.37 kg/m.  Assessment: 64 y.o. male with persistent post-operative ileus awaiting return of bowel function 11 Days Post-Op s/p robotic assisted laparoscopic right hemicolectomy and radical prostatectomy with pelvic lymph node dissection for prostate cancer  Glucose / Insulin: no h/o DM, sSSI (1 units in 24h) CBG: 96-134.  Electrolytes: WNL on 6/6, no labs on 6/7 Renal: Scr 1.24-->0.82-->1.12-->0.97>1.43>1.02>1.08 LFTs / TGs: TG 105 at baseline Prealbumin / albumin: 16.4/3.2>3.6 Intake / Output; MIVF:  NS@20ml /hr GI Imaging:multiple loops of dilated small bowel consistent with either persistent ileus or a degree of bowel obstruction. No free air evident. Clips noted right upper quadrant.  Surgeries / Procedures: no new surgeries since starting TPN  Central access: 08/23/19 TPN start date: 08/23/19  Nutritional Goals (per RD recommendation on 08/23/19): kCal/day: 2000 - 3000, Protein: 100-115 g/day, Fluid: >2300 mL/day Goal TPN rate is 83 mL/hr (provides 100 g of protein and 2233 kcals per day)  Current Nutrition:  Clear liquids  Plan:   Started clear liquid diet and started weaning TPN yesterday; advanced to full liquid diet today.  Per RD- Clinimix E 5/20 TPN @ 40 mL/hr at 1800 + 20% ILE at 20 mL/hr x 12 hrs  Electrolytes in TPN: standard, no additional supplementation  Add standard MVI and trace elements to TPN  continue Sensitive q6h SSI and adjust as needed   Continue MIVF to 20 mL/hr   Monitor TPN labs on Mon/Thurs and more often if clinically appropriate  Labs are wnl from 6/6. Will order labs for tomorrow as none today.    Rocky Morel, PharmD, BCPS Clinical Pharmacist 08/28/2019 8:01 AM

## 2019-08-29 ENCOUNTER — Inpatient Hospital Stay: Payer: 59

## 2019-08-29 ENCOUNTER — Ambulatory Visit: Payer: 59 | Admitting: Physician Assistant

## 2019-08-29 LAB — GLUCOSE, CAPILLARY
Glucose-Capillary: 114 mg/dL — ABNORMAL HIGH (ref 70–99)
Glucose-Capillary: 129 mg/dL — ABNORMAL HIGH (ref 70–99)
Glucose-Capillary: 130 mg/dL — ABNORMAL HIGH (ref 70–99)
Glucose-Capillary: 144 mg/dL — ABNORMAL HIGH (ref 70–99)

## 2019-08-29 LAB — BASIC METABOLIC PANEL
Anion gap: 9 (ref 5–15)
BUN: 27 mg/dL — ABNORMAL HIGH (ref 8–23)
CO2: 22 mmol/L (ref 22–32)
Calcium: 9 mg/dL (ref 8.9–10.3)
Chloride: 105 mmol/L (ref 98–111)
Creatinine, Ser: 1.14 mg/dL (ref 0.61–1.24)
GFR calc Af Amer: >60 mL/min (ref >60)
GFR calc non Af Amer: >60 mL/min (ref >60)
Glucose, Bld: 136 mg/dL — ABNORMAL HIGH (ref 70–99)
Potassium: 4.3 mmol/L (ref 3.5–5.1)
Sodium: 136 mmol/L (ref 135–145)

## 2019-08-29 LAB — MAGNESIUM: Magnesium: 2.1 mg/dL (ref 1.7–2.4)

## 2019-08-29 LAB — PHOSPHORUS: Phosphorus: 4 mg/dL (ref 2.5–4.6)

## 2019-08-29 MED ORDER — ALBUMIN HUMAN 25 % IV SOLN
12.5000 g | Freq: Once | INTRAVENOUS | Status: AC
  Administered 2019-08-29: 12.5 g via INTRAVENOUS
  Filled 2019-08-29: qty 50

## 2019-08-29 NOTE — Progress Notes (Signed)
Nutrition Follow Up Note   DOCUMENTATION CODES:   Not applicable  INTERVENTION:   Discontinue TPN  Ensure Enlive po BID, each supplement provides 350 kcal and 20 grams of protein  NUTRITION DIAGNOSIS:   Inadequate oral intake related to acute illness(post-operative ileus) as evidenced by (clear liquid diet, meal completion <50%).  GOAL:   Patient will meet greater than or equal to 90% of their needs  -met with TPN -progressing with oral intake   MONITOR:   PO intake, Supplement acceptance, Labs, Weight trends, Skin, I & O's  ASSESSMENT:   64 year old male with PMHx of GERD, arthritis s/p robotic assisted laparoscopic right hemicolectomy and radical prostatectomy with pelvic lymph node dissection for prostate cancer on 5/27, now with post-operative ileus.   Pt tolerating TPN well at half rate; plan is to discontinue today. Refeed labs stable. Pt eating 50-100% of his full liquid diet. Pt advanced to soft diet today. Pt is drinking Ensure supplements. Pt is having flatus and BMs. Per chart, pt is down ~13lbs since admit; RD will continue to monitor.     Medications reviewed and include: lovenox, synthroid  Labs reviewed: K 4.3 wnl, BUN 27(H), P 4.0 wnl, Mg 2.1 wnl Triglycerides 105- 6/3 cbgs- 144, 130, 129 x 24 hrs  Diet Order:   Diet Order            DIET SOFT Room service appropriate? Yes; Fluid consistency: Thin  Diet effective now             EDUCATION NEEDS:   No education needs have been identified at this time  Skin:  Skin Assessment: Reviewed RN Assessment  Last BM:  6/7- type 7  Height:   Ht Readings from Last 1 Encounters:  08/17/19 '5\' 5"'  (1.651 m)   Weight:   Wt Readings from Last 1 Encounters:  08/29/19 73.3 kg   BMI:  Body mass index is 26.89 kg/m.  Estimated Nutritional Needs:   Kcal:  0109-3235  Protein:  100-115 grams  Fluid:  >/= 2.3 L/day  Koleen Distance MS, RD, LDN Please refer to [facility] for RD and/or RD  on-call/weekend/after hours pager

## 2019-08-29 NOTE — Consult Note (Signed)
PHARMACY - TOTAL PARENTERAL NUTRITION CONSULT NOTE   Indication: High Risk for Malnutrition  Patient Measurements: Height: 5\' 5"  (165.1 cm) Weight: 73.3 kg (161 lb 9.6 oz) IBW/kg (Calculated) : 61.5 TPN AdjBW (KG): 66 Body mass index is 26.89 kg/m.  Assessment: 64 y.o. male with persistent post-operative ileus awaiting return of bowel function 12 Days Post-Op s/p robotic assisted laparoscopic right hemicolectomy and radical prostatectomy with pelvic lymph node dissection for prostate cancer  Glucose / Insulin: no h/o DM, sSSI (4 units in 24h) CBG: 123-144.  Electrolytes: WNL on 6/8 Renal: Scr 1.24-->0.82-->1.12-->0.97>1.43>1.02>1.08>1.14 LFTs / TGs: TG 105 at baseline Prealbumin / albumin: 16.4/3.2>3.6 Intake / Output; MIVF:  NS@20ml /hr GI Imaging:multiple loops of dilated small bowel consistent with either persistent ileus or a degree of bowel obstruction. No free air evident. Clips noted right upper quadrant.  Surgeries / Procedures: no new surgeries since starting TPN  Central access: 08/23/19 TPN start date: 08/23/19  Nutritional Goals (per RD recommendation on 08/23/19): kCal/day: 2000 - 3000, Protein: 100-115 g/day, Fluid: >2300 mL/day Goal TPN rate is 83 mL/hr (provides 100 g of protein and 2233 kcals per day)  Current Nutrition:  Clear liquids  Plan:   Per PA stop TPN today  No electrolyte supplementation needed at this time  Will stop  Sensitive q6h SSI as no hx of DM and BG WNL  Will sign off at this time. Thank you for the consult.      Rocky Morel, PharmD, BCPS Clinical Pharmacist 08/29/2019 9:14 AM

## 2019-08-29 NOTE — Progress Notes (Addendum)
[place] Hospital Day(s): 12.   Post op day(s): 12 Days Post-Op.   Interval History:  Patient seen and examined no acute events or new complaints overnight.  Patient reports he is feeling better this morning No abdominal pain, distension, nausea, or emesis Labs are reassuring this morning, no electrolyte derangement Cystogram shows contained leak, foley will stay in place for an additional week TPN is at 1/2 rate currently He has tolerated full liquids, + flatus, + BM Mobilizing   Vital signs in last 24 hours: [min-max] current  Temp:  [97.5 F (36.4 C)-98.2 F (36.8 C)] 98.1 F (36.7 C) (06/08 0542) Pulse Rate:  [96-129] 96 (06/08 0600) Resp:  [14-16] 16 (06/08 0542) BP: (113-132)/(88-95) 132/90 (06/08 0542) SpO2:  [100 %] 100 % (06/08 0542) Weight:  [73.3 kg] 73.3 kg (06/08 0500)     Height: 5\' 5"  (165.1 cm) Weight: 73.3 kg BMI (Calculated): 26.89   Intake/Output last 2 shifts:  06/07 0701 - 06/08 0700 In: 2496.3 [P.O.:840; I.V.:1656.3] Out: 750 [Urine:750]   Physical Exam:  Constitutional: alert, cooperative and no distress  Respiratory: breathing non-labored at rest  Cardiovascular: regular rate and sinus rhythm Gastrointestinal:Soft, non-tender,distension improved from yesterday, he remains tympanic,no rebound/guarding Genitourinary:Foley in place, no hematuria Integumentary:Laparoscopic incisions are CDI with dermabond, no erythema or drainage  Labs:  CBC Latest Ref Rng & Units 08/28/2019 08/27/2019 08/26/2019  WBC 4.0 - 10.5 K/uL 10.4 12.4(H) 11.0(H)  Hemoglobin 13.0 - 17.0 g/dL 11.5(L) 11.5(L) 11.1(L)  Hematocrit 39.0 - 52.0 % 34.9(L) 35.4(L) 34.9(L)  Platelets 150 - 400 K/uL 395 418(H) 381   CMP Latest Ref Rng & Units 08/29/2019 08/27/2019 08/26/2019  Glucose 70 - 99 mg/dL 136(H) 126(H) 121(H)  BUN 8 - 23 mg/dL 27(H) 23 23  Creatinine 0.61 - 1.24 mg/dL 1.14 1.08 1.02  Sodium 135 - 145 mmol/L 136 137 138  Potassium 3.5  - 5.1 mmol/L 4.3 4.2 3.5  Chloride 98 - 111 mmol/L 105 106 104  CO2 22 - 32 mmol/L 22 22 26   Calcium 8.9 - 10.3 mg/dL 9.0 9.1 9.0  Total Protein 6.5 - 8.1 g/dL - - 6.8  Total Bilirubin 0.3 - 1.2 mg/dL - - 0.4  Alkaline Phos 38 - 126 U/L - - 48  AST 15 - 41 U/L - - 23  ALT 0 - 44 U/L - - 41    Imaging studies: No new pertinent imaging studies   Assessment/Plan:  64 y.o. male doing well, return of bowel function, now with tachycardia of unclear etiology 12 Days Post-Op s/p robotic assisted laparoscopic right hemicolectomy and radical prostatectomy with pelvic lymph node dissection (Dr Erlene Quan) for prostate cancer   - Advance to soft diet  - Discontinue TPN  - Remove PICC ine once TPN stopped  - pain control prn             - monitor abdominal examination; on-going bowel fucntion               - Monitor electrolytes/mutritional labs   - Continue foley per urology; 1 more week   - Mobilization encouraged    - Discharge Planning: If tachycardia improves and tolerates soft diet, suspect discharge in next 24 hours  All of the above findings and recommendations were discussed with the patient, and the medical team, and all of patient's questions were answered to his expressed satisfaction.  -- Tony Simon, PA-C [facility] 08/29/2019, 7:36 AM [phone] M-F: 7am - 4pm

## 2019-08-30 ENCOUNTER — Inpatient Hospital Stay: Payer: 59

## 2019-08-30 LAB — CBC
HCT: 35.8 % — ABNORMAL LOW (ref 39.0–52.0)
Hemoglobin: 11.7 g/dL — ABNORMAL LOW (ref 13.0–17.0)
MCH: 24.2 pg — ABNORMAL LOW (ref 26.0–34.0)
MCHC: 32.7 g/dL (ref 30.0–36.0)
MCV: 74 fL — ABNORMAL LOW (ref 80.0–100.0)
Platelets: 443 10*3/uL — ABNORMAL HIGH (ref 150–400)
RBC: 4.84 MIL/uL (ref 4.22–5.81)
RDW: 14.5 % (ref 11.5–15.5)
WBC: 12.4 10*3/uL — ABNORMAL HIGH (ref 4.0–10.5)
nRBC: 0 % (ref 0.0–0.2)

## 2019-08-30 LAB — COMPREHENSIVE METABOLIC PANEL
ALT: 126 U/L — ABNORMAL HIGH (ref 0–44)
AST: 22 U/L (ref 15–41)
Albumin: 3.7 g/dL (ref 3.5–5.0)
Alkaline Phosphatase: 58 U/L (ref 38–126)
Anion gap: 11 (ref 5–15)
BUN: 29 mg/dL — ABNORMAL HIGH (ref 8–23)
CO2: 22 mmol/L (ref 22–32)
Calcium: 9.1 mg/dL (ref 8.9–10.3)
Chloride: 103 mmol/L (ref 98–111)
Creatinine, Ser: 1.28 mg/dL — ABNORMAL HIGH (ref 0.61–1.24)
GFR calc Af Amer: >60 mL/min (ref >60)
GFR calc non Af Amer: 60 mL/min — ABNORMAL LOW (ref >60)
Glucose, Bld: 109 mg/dL — ABNORMAL HIGH (ref 70–99)
Potassium: 4.3 mmol/L (ref 3.5–5.1)
Sodium: 136 mmol/L (ref 135–145)
Total Bilirubin: 0.4 mg/dL (ref 0.3–1.2)
Total Protein: 7 g/dL (ref 6.5–8.1)

## 2019-08-30 LAB — GLUCOSE, CAPILLARY
Glucose-Capillary: 112 mg/dL — ABNORMAL HIGH (ref 70–99)
Glucose-Capillary: 124 mg/dL — ABNORMAL HIGH (ref 70–99)

## 2019-08-30 MED ORDER — OXYBUTYNIN CHLORIDE 5 MG PO TABS: 5.0000 mg | ORAL_TABLET | Freq: Three times a day (TID) | ORAL | 0 refills | Status: DC | PRN

## 2019-08-30 MED ORDER — OXYCODONE HCL 5 MG PO TABS: 5.0000 mg | ORAL_TABLET | Freq: Four times a day (QID) | ORAL | 0 refills | Status: DC | PRN

## 2019-08-30 MED ORDER — SULFAMETHOXAZOLE-TRIMETHOPRIM 800-160 MG PO TABS
1.0000 | ORAL_TABLET | Freq: Two times a day (BID) | ORAL | 0 refills | Status: AC
Start: 2019-08-30 — End: 2019-09-06

## 2019-08-30 MED ORDER — ALBUMIN HUMAN 25 % IV SOLN
12.5000 g | Freq: Once | INTRAVENOUS | Status: AC
  Administered 2019-08-30: 12.5 g via INTRAVENOUS
  Filled 2019-08-30: qty 50

## 2019-08-30 NOTE — Discharge Summary (Signed)
[facility] SURGICAL DISCHARGE SUMMARY   Patient ID: LORETTA KLUENDER MRN: [id number] DOB/AGE: 62-04-1955 62 y.o.  Admit date: 08/17/2019 Discharge date: 08/30/2019  Discharge Diagnoses Patient Active Problem List   Diagnosis Date Noted  . S/P right colectomy 08/17/2019  . History of elevated PSA 04/07/2019    Consultants Urology  Procedures 08/17/2019:  1. Robotic assisted laparoscopic right hemicolectomy - Dr Dahlia Byes 2. Robotic assisted laparoscopic radical prostatectomy and bilateral pelvic lymph node disection - Dr Erlene Quan   HPI: Zahki Hoogendoorn Trapani is a 64 y.o. male with a history of large cecal polyp, as well as additional polyps in the ascending colon, found on screening colonoscopy in addition to gleason unfavorable intermediate risk prostate cancer who presents to [facility] on 05/27 for scheduled robotic assisted right hemicolectomy and radical prostatectomy and bilateral pelvic lymph node dissection with Dr Dahlia Byes and Dr Erlene Quan.   Hospital Course: Informed consent was obtained and documented, and patient underwent uneventful robotic assisted right hemicolectomy and radical prostatectomy and bilateral pelvic lymph node dissection (Dr Dahlia Byes and Dr Erlene Quan, 08/17/2019).  Post-operatively, patient initially did well and had return of bowel function on POD2 however he developed nausea and emesis in the subsequent post-operative days and had persistent post-operative ileus. He was started on TPN on 06/02. We did place an NGT for a few hours which had  High output but he did remove this the night of placement secondary to overwhelming discomfort. He underwent repeat CT Abdomen/Pelvis on 06/04 which was reassuring without evidence of anastomotic leak. Patient started to have return of bowel function on 06/05 and diet was sequentially advanced over the next few hospital days and TPN was weaned to discontinuation accordingly. He underwent cystogram on 06/07 to reassess need for foley however  this showed a small contained leak and so foley was not removed. PICC line was removed on 06/08 after discontinuing TPN. He was mildly tachycardic on POD11-12 however there was no clear etiology or signs of infection. The remainder of patient's hospital course was essentially unremarkable, and discharge planning was initiated accordingly with patient safely able to be discharged home with appropriate discharge instructions, antibiotics (Bactrim x 7 days), pain control, and outpatient follow-up after all of his questions were answered to his expressed satisfaction.   Discharge Condition: Good   Physical Examination:  Constitutional: alert, cooperative and no distress  Respiratory: breathing non-labored at rest  Cardiovascular: regular rate and sinus rhythm Gastrointestinal:Soft, non-tender,non-distended, no longer with gross tympany,no rebound/guarding Genitourinary:Foley in place, no hematuria Integumentary:Laparoscopic incisions are CDI with dermabond, no erythema or drainage   Allergies as of 08/30/2019   No Known Allergies     Medication List    STOP taking these medications   neomycin 500 MG tablet Commonly known as: MYCIFRADIN   polyethylene glycol powder 17 GM/SCOOP powder Commonly known as: MiraLax     TAKE these medications   levothyroxine 150 MCG tablet Commonly known as: SYNTHROID TAKE 1 TABLET BY MOUTH EVERY DAY   naproxen sodium 220 MG tablet Commonly known as: ALEVE Take 440 mg by mouth 2 (two) times daily as needed (pain.).   oxybutynin 5 MG tablet Commonly known as: DITROPAN Take 1 tablet (5 mg total) by mouth every 8 (eight) hours as needed for bladder spasms.   oxyCODONE 5 MG immediate release tablet Commonly known as: Oxy IR/ROXICODONE Take 1 tablet (5 mg total) by mouth every 6 (six) hours as needed for severe pain or breakthrough pain.   sulfamethoxazole-trimethoprim 800-160 MG tablet Commonly  known as: BACTRIM DS Take 1 tablet by mouth 2 (two)  times daily for 7 days.        Follow-up Information    P[place]on, [place] F, MD. Schedule an appointment as soon as possible for a visit in 1 week(s).   Specialty: General Surgery Why: s/p right hemicolectomy  Contact information: [address] [place] [address] [phone]        Debroah Loop, [place]. Schedule an appointment as soon as possible for a visit in 1 week(s).   Specialty: Urology Why: follow up in 1 week for possible foley removal, needs cystogram prior  Contact information: [place] [address] [phone]            Time spent on discharge management including discussion of hospital course, clinical condition, outpatient instructions, prescriptions, and follow up with the patient and members of the medical team: >30 minutes  -- Edison Simon , PA-C [facility]  08/30/2019, 12:16 PM [phone] M-F: 7am - 4pm

## 2019-08-30 NOTE — Plan of Care (Signed)
Patient discharged to home with self care with foley intact.  Foley care education provided as well as supplies given of  leg bag, urinary bag, urinal, foley wipes.  Patient demonstrated ability to change urinary bag to leg bag and secure to leg appropriately. Patient had no questions about care.Reviewed medication changes and discharge instructions with patient and family who verbalized understanding. Patient assisted to change clothes and transported to lobby for discharge. PIV removed prior to discharge.

## 2019-09-04 ENCOUNTER — Telehealth: Payer: Self-pay | Admitting: Surgery

## 2019-09-04 NOTE — Telephone Encounter (Signed)
Patient states having some constipation, has not been able to go to the bathroom several days now.  He is having some discomfort in that area as well.  Please call.  Thank you.

## 2019-09-04 NOTE — Telephone Encounter (Signed)
Spoke with patient and advised him to try over the counter Miralax or Laxatives to help with constipation since surgery. Also, informed patient he may be experiencing pain in the area due to not having a bowel movement/constipation. Patient states he will try it and if symptoms do not improved, patient will give our office a call back. Patient verbalized understanding.

## 2019-09-05 ENCOUNTER — Other Ambulatory Visit: Payer: Self-pay

## 2019-09-05 DIAGNOSIS — C61 Malignant neoplasm of prostate: Secondary | ICD-10-CM

## 2019-09-06 ENCOUNTER — Ambulatory Visit: Payer: 59 | Admitting: Physician Assistant

## 2019-09-06 ENCOUNTER — Ambulatory Visit
Admission: RE | Admit: 2019-09-06 | Discharge: 2019-09-06 | Disposition: A | Discharge status: Home / Self Care | Payer: 59 | Source: Ambulatory Visit | Attending: Urology | Admitting: Urology

## 2019-09-06 ENCOUNTER — Encounter: Payer: Self-pay | Admitting: Surgery

## 2019-09-06 ENCOUNTER — Ambulatory Visit (INDEPENDENT_AMBULATORY_CARE_PROVIDER_SITE_OTHER): Payer: Self-pay | Admitting: Surgery

## 2019-09-06 ENCOUNTER — Other Ambulatory Visit: Payer: Self-pay

## 2019-09-06 ENCOUNTER — Encounter: Payer: Self-pay | Admitting: Physician Assistant

## 2019-09-06 ENCOUNTER — Ambulatory Visit (INDEPENDENT_AMBULATORY_CARE_PROVIDER_SITE_OTHER): Payer: 59 | Admitting: Physician Assistant

## 2019-09-06 VITALS — BP 120/86 | HR 99 | Ht 65.0 in | Wt 154.0 lb

## 2019-09-06 VITALS — BP 111/79 | HR 102 | Temp 97.3°F | Resp 14 | Ht 65.0 in | Wt 153.8 lb

## 2019-09-06 DIAGNOSIS — C61 Malignant neoplasm of prostate: Secondary | ICD-10-CM | POA: Diagnosis present

## 2019-09-06 DIAGNOSIS — Z09 Encounter for follow-up examination after completed treatment for conditions other than malignant neoplasm: Secondary | ICD-10-CM

## 2019-09-06 MED ORDER — IOTHALAMATE MEGLUMINE 17.2 % UR SOLN
250.0000 mL | Freq: Once | URETHRAL | Status: AC | PRN
  Administered 2019-09-06: 50 mL

## 2019-09-06 NOTE — Patient Instructions (Signed)
Return in 2 weeks with another cystogram first. You will receive a phone call to schedule this test.

## 2019-09-06 NOTE — Progress Notes (Signed)
Patient presented to clinic today for possible Foley catheter removal with cystogram prior following radical prostatectomy with Dr. Erlene Quan on 08/17/2019.  Cystogram today shows persistent anastomotic leak.  Foley catheter to remain in place for an additional 2 weeks to allow for healing.  We will plan for repeat cystogram in 2 weeks with return to clinic for possible Foley catheter removal afterward per results.  Patient expressed understanding.  Leg strap provided today.  Followup: Return in about 2 weeks (around 09/20/2019) for Possible Foley removal with cystogram prior.

## 2019-09-06 NOTE — Patient Instructions (Signed)
Follow-up with our office as needed.  Please call and ask to speak with a nurse if you develop questions or concerns.   GENERAL POST-OPERATIVE PATIENT INSTRUCTIONS   WOUND CARE INSTRUCTIONS:  Keep a dry clean dressing on the wound if there is drainage. The initial bandage may be removed after 24 hours.  Once the wound has quit draining you may leave it open to air.  If clothing rubs against the wound or causes irritation and the wound is not draining you may cover it with a dry dressing during the daytime.  Try to keep the wound dry and avoid ointments on the wound unless directed to do so.  If the wound becomes bright red and painful or starts to drain infected material that is not clear, please contact your physician immediately.  If the wound is mildly pink and has a thick firm ridge underneath it, this is normal, and is referred to as a healing ridge.  This will resolve over the next 4-6 weeks.  BATHING: You may shower if you have been informed of this by your surgeon. However, Please do not submerge in a tub, hot tub, or pool until incisions are completely sealed or have been told by your surgeon that you may do so.  DIET:  You may eat any foods that you can tolerate.  It is a good idea to eat a high fiber diet and take in plenty of fluids to prevent constipation.  If you do become constipated you may want to take a mild laxative or take ducolax tablets on a daily basis until your bowel habits are regular.  Constipation can be very uncomfortable, along with straining, after recent surgery.  ACTIVITY:  You are encouraged to cough and deep breath or use your incentive spirometer if you were given one, every 15-30 minutes when awake.  This will help prevent respiratory complications and low grade fevers post-operatively if you had a general anesthetic.  You may want to hug a pillow when coughing and sneezing to add additional support to the surgical area, if you had abdominal or chest surgery, which  will decrease pain during these times.  You are encouraged to walk and engage in light activity for the next two weeks.  You should not lift more than 20 pounds for 6 weeks after surgery as it could put you at increased risk for complications.  Twenty pounds is roughly equivalent to a plastic bag of groceries. At that time- Listen to your body when lifting, if you have pain when lifting, stop and then try again in a few days. Soreness after doing exercises or activities of daily living is normal as you get back in to your normal routine.  MEDICATIONS:  Try to take narcotic medications and anti-inflammatory medications, such as tylenol, ibuprofen, naprosyn, etc., with food.  This will minimize stomach upset from the medication.  Should you develop nausea and vomiting from the pain medication, or develop a rash, please discontinue the medication and contact your physician.  You should not drive, make important decisions, or operate machinery when taking narcotic pain medication.  SUNBLOCK Use sun block to incision area over the next year if this area will be exposed to sun. This helps decrease scarring and will allow you avoid a permanent darkened area over your incision.  QUESTIONS:  Please feel free to call our office if you have any questions, and we will be glad to assist you.    

## 2019-09-07 ENCOUNTER — Encounter: Payer: Self-pay | Admitting: Surgery

## 2019-09-07 ENCOUNTER — Ambulatory Visit: Payer: 59 | Admitting: Physician Assistant

## 2019-09-07 NOTE — Progress Notes (Signed)
Mr. Wojtaszek is a status post robotic right colectomy.  Pathology discussed with the patient in detail.  No need for any further chemotherapy or intervention at this time. He is doing very well from an abdominal perspective and he is eating regular diet and having bowel movements.  He has a persistent leak from the urethra.  He still has a Foley.  Physical exam: No acute distress.  Abdomen: Soft nontender nondistended incisions healing well without any infection.  A/P doing very well from colectomy perspective, no complications.  Ileus resolved and persistent leak from the urethra  Likely contributed to it. Continue management per urology. Colonoscopy per GI recommendations.

## 2019-09-08 ENCOUNTER — Other Ambulatory Visit: Payer: Self-pay | Admitting: Family Medicine

## 2019-09-08 DIAGNOSIS — C61 Malignant neoplasm of prostate: Secondary | ICD-10-CM

## 2019-09-11 ENCOUNTER — Other Ambulatory Visit: Payer: Self-pay

## 2019-09-11 ENCOUNTER — Other Ambulatory Visit: Payer: 59

## 2019-09-11 DIAGNOSIS — C61 Malignant neoplasm of prostate: Secondary | ICD-10-CM

## 2019-09-12 ENCOUNTER — Telehealth: Payer: Self-pay | Admitting: *Deleted

## 2019-09-12 LAB — PSA: Prostate Specific Ag, Serum: <0.1 ng/mL (ref 0.0–4.0)

## 2019-09-12 NOTE — Telephone Encounter (Addendum)
Patient informed, appointment cancelled, aware of follow up.   ----- Message from Hollice Espy, MD sent at 09/12/2019  8:25 AM EDT ----- PSA is undetectable which means you are cancer free.  Awesome news.  No need for you to come to the office tomorrow, you are already scheduled next week with Sam for cystogram.  Please cancel tomorrow's follow-up.  Hollice Espy, MD

## 2019-09-13 ENCOUNTER — Ambulatory Visit: Payer: 59 | Admitting: Urology

## 2019-09-19 ENCOUNTER — Ambulatory Visit: Payer: 59 | Attending: Urology | Admitting: Physical Therapy

## 2019-09-20 ENCOUNTER — Ambulatory Visit (INDEPENDENT_AMBULATORY_CARE_PROVIDER_SITE_OTHER): Payer: 59 | Admitting: Physician Assistant

## 2019-09-20 ENCOUNTER — Ambulatory Visit
Admission: RE | Admit: 2019-09-20 | Discharge: 2019-09-20 | Disposition: A | Discharge status: Home / Self Care | Payer: 59 | Source: Ambulatory Visit | Attending: Physician Assistant | Admitting: Physician Assistant

## 2019-09-20 ENCOUNTER — Other Ambulatory Visit: Payer: Self-pay

## 2019-09-20 VITALS — BP 153/98 | HR 94 | Ht 65.0 in | Wt 153.0 lb

## 2019-09-20 DIAGNOSIS — Z466 Encounter for fitting and adjustment of urinary device: Secondary | ICD-10-CM

## 2019-09-20 DIAGNOSIS — C61 Malignant neoplasm of prostate: Secondary | ICD-10-CM

## 2019-09-20 MED ORDER — SULFAMETHOXAZOLE-TRIMETHOPRIM 800-160 MG PO TABS: 1.0000 | ORAL_TABLET | Freq: Two times a day (BID) | ORAL | Status: DC

## 2019-09-20 MED ORDER — SULFAMETHOXAZOLE-TRIMETHOPRIM 800-160 MG PO TABS
1.0000 | ORAL_TABLET | Freq: Two times a day (BID) | ORAL | Status: DC
  Administered 2019-09-20: 1 via ORAL

## 2019-09-20 MED ORDER — SULFAMETHOXAZOLE-TRIMETHOPRIM 800-160 MG PO TABS: 1.0000 | ORAL_TABLET | Freq: Two times a day (BID) | ORAL | 0 refills | Status: AC

## 2019-09-20 MED ORDER — IOTHALAMATE MEGLUMINE 17.2 % UR SOLN
250.0000 mL | Freq: Once | URETHRAL | Status: AC
  Administered 2019-09-20: 100 mL via URETHRAL

## 2019-09-20 NOTE — Progress Notes (Signed)
09/20/2019 9:29 AM   Tony Hernandez [birth year] [id number]  CC: Chief Complaint  Patient presents with  . Prostate Cancer    possible Foley removal    HPI: Tony Hernandez is a 64 y.o. 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 Postop course was complicated by ileus associated with concurrent right colectomy, resolved, as well as persistent anastomotic leak requiring prolonged Foley placement.  Repeat cystogram today reveals resolution of anastomotic leak.  PMH: Past Medical History:  Diagnosis Date  . Arthritis    knees  . GERD (gastroesophageal reflux disease)   . Thyroid disease   . Wears dentures     Surgical History: Past Surgical History:  Procedure Laterality Date  . CHOLECYSTECTOMY  2018  . COLONOSCOPY WITH PROPOFOL N/A 05/19/2019   Procedure: COLONOSCOPY WITH PROPOFOL;  Surgeon: Tony Bellows, MD;  Location: [facility];  Service: Endoscopy;  Laterality: N/A;  Priority 4  . POLYPECTOMY  05/19/2019   Procedure: POLYPECTOMY;  Surgeon: Tony Bellows, MD;  Location: [facility];  Service: Endoscopy;;  . ROBOT ASSISTED LAPAROSCOPIC RADICAL PROSTATECTOMY Bilateral 08/17/2019   Procedure: XI ROBOTIC ASSISTED LAPAROSCOPIC PROSTATECTOMY, BILATERAL LYMPH NODE DISSECTION;  Surgeon: Tony Espy, MD;  Location: [facility];  Service: Urology;  Laterality: Bilateral;    Home Medications:  Allergies as of 09/20/2019   No Known Allergies     Medication List       Accurate as of September 20, 2019  9:29 AM. If you have any questions, ask your nurse or doctor.        levothyroxine 150 MCG tablet Commonly known as: SYNTHROID TAKE 1 TABLET BY MOUTH EVERY DAY   naproxen sodium 220 MG tablet Commonly known as: ALEVE Take 440 mg by mouth 2 (two) times daily as needed (pain.).   oxybutynin 5 MG tablet Commonly known as: DITROPAN Take 1 tablet (5 mg total) by mouth every 8  (eight) hours as needed for bladder spasms.   oxyCODONE 5 MG immediate release tablet Commonly known as: Oxy IR/ROXICODONE Take 1 tablet (5 mg total) by mouth every 6 (six) hours as needed for severe pain or breakthrough pain.       Allergies:  No Known Allergies  Family History: Family History  Problem Relation Age of Onset  . Lung cancer Mother 36  . Diabetes Paternal Grandmother   . Colon cancer Sister 38    Social History:   reports that he quit smoking about 18 years ago. His smoking use included cigarettes. He quit after 10.00 years of use. He has never used smokeless tobacco. He reports previous drug use. Drugs: Cocaine and Marijuana. He reports that he does not drink alcohol.  Physical Exam: BP (!) 153/98   Pulse 94   Ht 5\' 5"  (1.651 m)   Wt 153 lb (69.4 kg)   BMI 25.46 kg/m   Constitutional:  Alert and oriented, no acute distress, nontoxic appearing HEENT: [place], AT Cardiovascular: No clubbing, cyanosis, or edema Respiratory: Normal respiratory effort, no increased work of breathing Skin: No rashes, bruises or suspicious lesions Neurologic: Grossly intact, no focal deficits, moving all 4 extremities Psychiatric: Normal mood and affect  Pertinent Imaging: Cystogram, 09/20/2019: CLINICAL DATA:  64 year old male status post prostatectomy. Evaluate for and asthmaticus leak prior to Foley removal.  EXAM: CYSTOGRAM  TECHNIQUE: Urinary bladder was filled with 100 mL Cysto-Hypaque 30% by drip infusion via the pre-existing indwelling Foley balloon  catheter. Serial spot images were obtained during bladder filling.  FLUOROSCOPY TIME:  Fluoroscopy Time:  1.2 minutes  Radiation Exposure Index (if provided by the fluoroscopic device): 22.5 mGy  COMPARISON:  Cystogram 09/06/2019.  FINDINGS: During bladder filling, there was no evidence of persistent extravasation of contrast material in the region of the prostatectomy bed. The bladder was repeatedly  pressurized, to the level of patient tolerance, and despite repeated pressurization, no evidence of persistent extravasation of contrast material was noted.  IMPRESSION: 1. Previously noted contained extravasation in the prostatectomy bed has resolved. Urinary bladder is unremarkable in appearance on today's examination.   Electronically Signed   By: Tony Hernandez M.D.   On: 09/20/2019 09:08  I personally reviewed the images referenced above and note resolution of the anastomotic leak.  Assessment & Plan:   1. Prostate cancer ([place]) Postop PSA undetectable. Will schedule 3 month follow-up with Dr. Erlene Hernandez with PSA prior. - PSA; Future  2. Encounter for Foley catheter removal Anastomotic leak resolved on cystogram today per radiology review and review with Dr. Diamantina Hernandez; okay to remove Foley, procedure note below. One dose Bactrim DS given in clinic today prior to catheter removal; prescribing an additional 5 doses for a total 3 day course for UTI prevention.  Catheter Removal Patient is present today for a catheter removal.  41ml of water was drained from the balloon. A 18FR foley cath was removed from the bladder no complications were noted . Patient tolerated well.  Performed by: Tony Loop, Tony Hernandez  - sulfamethoxazole-trimethoprim (BACTRIM DS) 800-160 MG tablet; Take 1 tablet by mouth 2 (two) times daily for 3 days.  Dispense: 5 tablet; Refill: 0 - sulfamethoxazole-trimethoprim (BACTRIM DS) 800-160 MG per tablet 1 tablet   Return in about 3 months (around 12/21/2019) for Prostatectomy f/u with PSA prior with Dr. Erlene Hernandez.  Tony Loop, Tony Hernandez  [facility] [address] [place], [place] [address] [phone]

## 2019-10-20 ENCOUNTER — Telehealth: Payer: Self-pay

## 2019-10-20 NOTE — Telephone Encounter (Signed)
Patient called triage line wanting to know if it is normal for him to still be leaking a month later after foley removal? Patient states he believes the leaking has reduced somewhat but is still having to wear a Depends. He has a F/U appt scheduled with Dr Erlene Quan 9/29. Please advise.

## 2019-10-23 NOTE — Telephone Encounter (Signed)
Patient left another message on triage line today in regards to his call on Friday. Please advise.

## 2019-10-23 NOTE — Telephone Encounter (Signed)
I just spoke with the patient via telephone.  He reiterated his concerns regarding urinary leakage and erectile dysfunction following prostatectomy (bilateral nerve sparing).  I explained that these are common findings after prostatectomy and that they take time to improve vs. resolve.  I reiterated my earlier MyChart message in which I counseled him to start Kegel exercises 3x10 sets daily and counseled him to keep his next scheduled follow-up appointment with Dr. Erlene Quan.  He expressed understanding.

## 2019-12-05 ENCOUNTER — Other Ambulatory Visit: Payer: Self-pay

## 2019-12-07 ENCOUNTER — Other Ambulatory Visit: Payer: 59

## 2019-12-07 ENCOUNTER — Other Ambulatory Visit: Payer: Self-pay

## 2019-12-07 DIAGNOSIS — C61 Malignant neoplasm of prostate: Secondary | ICD-10-CM

## 2019-12-08 LAB — PSA: Prostate Specific Ag, Serum: <0.1 ng/mL (ref 0.0–4.0)

## 2019-12-19 NOTE — Progress Notes (Signed)
12/20/2019 3:34 PM   Tony Hernandez 06-24-1955 [id number]  Referring provider: Pleas Koch, NP [place],  [place] [address] Chief Complaint  Patient presents with  . Prostate Cancer    36mo follow up    HPI: Tony Hernandez is a 64 y.o. male who returns for a 3 month follow up of prostate cancer.   He underwent robot assisted laparoscopic right colectomy on 08/17/19 performed by Dr. Dahlia Byes. He also underwent laparoscopic radical prostatectomy and bilateral pelvic lymph node dissection.   Pathology on 08/17/2019 showed Gleason 3+4, lowercase pT2, pN0, negative bladder neck, margins, and nodes. No extraprostatic extension.   CT A/P w/ contrast on 08/25/2019 noted post operative course complicated by urinary leakage and ielus that resolved.   He has some leakage but it is steadily improving. He is wearing pull-ups because he does not like pads. He is performing pelvic floor exercises.   He has decreased libido. Denies issues with erection dysfunction in the past.  No spontaneous erections today.  He is not use any PDE 5 inhibitors in the past.  No family history of prostate cancer.  PSA Trend:  12, 2019  6.55, 05/2018 7.5 04/07/19 <0.1 09/11/2019 <0.1 12/07/2019   PMH: Past Medical History:  Diagnosis Date  . Arthritis    knees  . GERD (gastroesophageal reflux disease)   . Thyroid disease   . Wears dentures     Surgical History: Past Surgical History:  Procedure Laterality Date  . CHOLECYSTECTOMY  2018  . COLONOSCOPY WITH PROPOFOL N/A 05/19/2019   Procedure: COLONOSCOPY WITH PROPOFOL;  Surgeon: [name], MD;  Location: [facility];  Service: Endoscopy;  Laterality: N/A;  Priority 4  . POLYPECTOMY  05/19/2019   Procedure: POLYPECTOMY;  Surgeon: [name], MD;  Location: [facility];  Service: Endoscopy;;  . ROBOT ASSISTED LAPAROSCOPIC RADICAL PROSTATECTOMY Bilateral 08/17/2019   Procedure: XI ROBOTIC ASSISTED LAPAROSCOPIC PROSTATECTOMY,  BILATERAL LYMPH NODE DISSECTION;  Surgeon: Hollice Espy, MD;  Location: [facility];  Service: Urology;  Laterality: Bilateral;    Home Medications:  Allergies as of 12/20/2019   No Known Allergies     Medication List       Accurate as of December 20, 2019 11:59 PM. If you have any questions, ask your nurse or doctor.        STOP taking these medications   naproxen sodium 220 MG tablet Commonly known as: ALEVE Stopped by: Hollice Espy, MD   oxybutynin 5 MG tablet Commonly known as: DITROPAN Stopped by: Hollice Espy, MD   oxyCODONE 5 MG immediate release tablet Commonly known as: Oxy IR/ROXICODONE Stopped by: Hollice Espy, MD     TAKE these medications   levothyroxine 150 MCG tablet Commonly known as: SYNTHROID TAKE 1 TABLET BY MOUTH EVERY DAY   sildenafil 20 MG tablet Commonly known as: Revatio Take 1 tablet (20 mg total) by mouth as needed. Take 1-5 tabs as needed prior to intercourse Started by: Hollice Espy, MD       Allergies: No Known Allergies  Family History: Family History  Problem Relation Age of Onset  . Lung cancer Mother 14  . Diabetes Paternal Grandmother   . Colon cancer Sister 70    Social History:  reports that he quit smoking about 18 years ago. His smoking use included cigarettes. He quit after 10.00 years of use. He has never used smokeless tobacco. He reports previous drug use. Drugs: Cocaine and Marijuana. He reports that  he does not drink alcohol.   Physical Exam: BP (!) 151/93   Pulse 85   Ht 5\' 5"  (1.651 m)   Wt 156 lb (70.8 kg)   BMI 25.96 kg/m   Constitutional:  Alert and oriented, No acute distress. HEENT: [place] AT, moist mucus membranes.  Trachea midline, no masses. Cardiovascular: No clubbing, cyanosis, or edema. Respiratory: Normal respiratory effort, no increased work of breathing. Skin: No rashes, bruises or suspicious lesions. Neurologic: Grossly intact, no focal deficits, moving all 4 extremities. Psychiatric:  Normal mood and affect.  Laboratory Data:  Lab Results  Component Value Date   CREATININE 1.28 (H) 08/30/2019    Lab Results  Component Value Date   PSA 7.5 (H) 04/07/2019   PSA 6.55 (H) 06/09/2018    No results found for: TESTOSTERONE  Lab Results  Component Value Date   HGBA1C 6.1 (H) 08/22/2019    Assessment & Plan:   1. Prostate cancer PSA is undetectable.  Pathology reviewed.  Prognosis is overall excellent, will continue to check PSA on a every 6 month basis in the short-term and then annually thereafter  2. Stress incontinence  Improving.  Wearing pull-ups.   Continue pelvic floor exercises, anticipate slow continued improvement with time  3.Erectile dysfunctionv following radical prostatectomy Difficulty maintaining and achieving erections post prostatectomy We discussed penile rehab at length, strongly encourage penile stretching PDE 5 usage We also discussed our protocol on intracavernosal injections, may consider pursuing this, let us know how he does with oral agents -Sildenafil Rx sent.     Return in about 6 months (around 06/18/2020) for 47mo PSA, 1year follow up w/PSA.  [facility] [address] [place], [place] [address] [phone]  I, Selena Batten, am acting as a scribe for Dr. Hollice Espy.  I have reviewed the above documentation for accuracy and completeness, and I agree with the above.   Hollice Espy, MD

## 2019-12-20 ENCOUNTER — Encounter: Payer: Self-pay | Admitting: Urology

## 2019-12-20 ENCOUNTER — Other Ambulatory Visit: Payer: Self-pay

## 2019-12-20 ENCOUNTER — Ambulatory Visit (INDEPENDENT_AMBULATORY_CARE_PROVIDER_SITE_OTHER): Payer: 59 | Admitting: Urology

## 2019-12-20 VITALS — BP 151/93 | HR 85 | Ht 65.0 in | Wt 156.0 lb

## 2019-12-20 DIAGNOSIS — N393 Stress incontinence (female) (male): Secondary | ICD-10-CM

## 2019-12-20 DIAGNOSIS — C61 Malignant neoplasm of prostate: Secondary | ICD-10-CM | POA: Diagnosis not present

## 2019-12-20 DIAGNOSIS — N5231 Erectile dysfunction following radical prostatectomy: Secondary | ICD-10-CM | POA: Diagnosis not present

## 2019-12-20 MED ORDER — SILDENAFIL CITRATE 20 MG PO TABS: 20.0000 mg | ORAL_TABLET | ORAL | 5 refills | Status: DC | PRN

## 2019-12-21 ENCOUNTER — Other Ambulatory Visit: Payer: Self-pay

## 2019-12-28 ENCOUNTER — Ambulatory Visit: Payer: Self-pay | Admitting: Urology

## 2020-01-12 ENCOUNTER — Telehealth: Payer: Self-pay

## 2020-01-12 NOTE — Telephone Encounter (Signed)
Incoming call from pt who states that he has tried Sildenafil up to 100mg  by mouth but this has not helped him maintain erection. He states that he was told to call back and speak with Sam if oral medication was not helpful. Patient states he is very leary of trying any penile injections. Would you like me to schedule pt for in office appt to discuss? Please advise.

## 2020-01-15 MED ORDER — TADALAFIL 5 MG PO TABS
5.0000 mg | ORAL_TABLET | Freq: Every day | ORAL | 2 refills | Status: DC | PRN
Start: 2020-01-15 — End: 2020-07-17

## 2020-01-15 MED ORDER — TADALAFIL 5 MG PO TABS
5.0000 mg | ORAL_TABLET | Freq: Every day | ORAL | 2 refills | Status: DC | PRN
Start: 2020-01-15 — End: 2020-01-15

## 2020-01-15 NOTE — Telephone Encounter (Signed)
Please contact the patient and inform him that I would like him to start a daily low-dose Cialis in addition to as-needed sildenafil for his erectile dysfunction.  I have sent Cialis to the [facility], where it will be cheaper.  There is a free coupon available for this medication on GoodRx.  He may take Cialis at anytime of day, with or without meals.  He may continue to use Viagra up to 100 mg as needed with sexual activity.  Please remind him to take Viagra on an empty stomach 1 hour in advance of sexual activity and that he will require physical stimulation to achieve erection on this class of medication.

## 2020-01-15 NOTE — Telephone Encounter (Signed)
Notified patient as instructed, patient pleased. Discussed follow-up appointments, patient agrees  

## 2020-01-22 ENCOUNTER — Encounter: Payer: Self-pay | Admitting: Primary Care

## 2020-01-22 ENCOUNTER — Other Ambulatory Visit: Payer: Self-pay

## 2020-01-22 ENCOUNTER — Ambulatory Visit (INDEPENDENT_AMBULATORY_CARE_PROVIDER_SITE_OTHER)
Admission: RE | Admit: 2020-01-22 | Discharge: 2020-01-22 | Disposition: A | Discharge status: Home / Self Care | Payer: 59 | Source: Ambulatory Visit | Attending: Primary Care | Admitting: Primary Care

## 2020-01-22 ENCOUNTER — Ambulatory Visit (INDEPENDENT_AMBULATORY_CARE_PROVIDER_SITE_OTHER): Payer: 59 | Admitting: Primary Care

## 2020-01-22 VITALS — BP 110/74 | HR 100 | Temp 98.4°F | Ht 65.0 in | Wt 161.0 lb

## 2020-01-22 DIAGNOSIS — Z1159 Encounter for screening for other viral diseases: Secondary | ICD-10-CM

## 2020-01-22 DIAGNOSIS — M25562 Pain in left knee: Secondary | ICD-10-CM | POA: Diagnosis not present

## 2020-01-22 DIAGNOSIS — G8929 Other chronic pain: Secondary | ICD-10-CM

## 2020-01-22 DIAGNOSIS — Z23 Encounter for immunization: Secondary | ICD-10-CM | POA: Diagnosis not present

## 2020-01-22 LAB — CBC
HCT: 40.8 % (ref 39.0–52.0)
Hemoglobin: 13.4 g/dL (ref 13.0–17.0)
MCHC: 32.9 g/dL (ref 30.0–36.0)
MCV: 74.6 fl — ABNORMAL LOW (ref 78.0–100.0)
Platelets: 359 10*3/uL (ref 150.0–400.0)
RBC: 5.47 Mil/uL (ref 4.22–5.81)
RDW: 16 % — ABNORMAL HIGH (ref 11.5–15.5)
WBC: 7.6 10*3/uL (ref 4.0–10.5)

## 2020-01-22 LAB — URIC ACID: Uric Acid, Serum: 6.5 mg/dL (ref 4.0–7.8)

## 2020-01-22 MED ORDER — DICLOFENAC SODIUM 1 % EX GEL: 2.0000 g | Freq: Three times a day (TID) | CUTANEOUS | 1 refills | Status: DC | PRN

## 2020-01-22 NOTE — Assessment & Plan Note (Signed)
Chronic for years, worse recently. Exam today overall stable, no infection or deformity.  Start with plain films of knee. Check uric acid and CBC to rule out other causes.  Rx for diclofenac provided.  Offered physical therapy vs Sports medicine evaluation, he would like to see Dr. Lorelei Pont and will set up a visit.

## 2020-01-22 NOTE — Patient Instructions (Addendum)
Stop by the lab and xray prior to leaving today. I will notify you of your results once received.   You can apply the diclofenac (Voltaren) gel to the knee three times daily as needed for pain.  Set up a visit with Dr. Lorelei Pont for the knee as discussed.  Try wearing a knee sleeve/brace for support during the day.  You are due in late January/early February 2022 for your physical.   It was a pleasure to see you today!     Recombinant Zoster (Shingles) Vaccine: What You Need to Know 1. Why get vaccinated? Recombinant zoster (shingles) vaccine can prevent shingles. Shingles (also called herpes zoster, or just zoster) is a painful skin rash, usually with blisters. In addition to the rash, shingles can cause fever, headache, chills, or upset stomach. More rarely, shingles can lead to pneumonia, hearing problems, blindness, brain inflammation (encephalitis), or death. The most common complication of shingles is long-term nerve pain called postherpetic neuralgia (PHN). PHN occurs in the areas where the shingles rash was, even after the rash clears up. It can last for months or years after the rash goes away. The pain from PHN can be severe and debilitating. About 10 to 18% of people who get shingles will experience PHN. The risk of PHN increases with age. An older adult with shingles is more likely to develop PHN and have longer lasting and more severe pain than a younger person with shingles. Shingles is caused by the varicella zoster virus, the same virus that causes chickenpox. After you have chickenpox, the virus stays in your body and can cause shingles later in life. Shingles cannot be passed from one person to another, but the virus that causes shingles can spread and cause chickenpox in someone who had never had chickenpox or received chickenpox vaccine. 2. Recombinant shingles vaccine Recombinant shingles vaccine provides strong protection against shingles. By preventing shingles,  recombinant shingles vaccine also protects against PHN. Recombinant shingles vaccine is the preferred vaccine for the prevention of shingles. However, a different vaccine, live shingles vaccine, may be used in some circumstances. The recombinant shingles vaccine is recommended for adults 50 years and older without serious immune problems. It is given as a two-dose series. This vaccine is also recommended for people who have already gotten another type of shingles vaccine, the live shingles vaccine. There is no live virus in this vaccine. Shingles vaccine may be given at the same time as other vaccines. 3. Talk with your health care provider Tell your vaccine provider if the person getting the vaccine:  Has had an allergic reaction after a previous dose of recombinant shingles vaccine, or has any severe, life-threatening allergies.  Is pregnant or breastfeeding.  Is currently experiencing an episode of shingles. In some cases, your health care provider may decide to postpone shingles vaccination to a future visit. People with minor illnesses, such as a cold, may be vaccinated. People who are moderately or severely ill should usually wait until they recover before getting recombinant shingles vaccine. Your health care provider can give you more information. 4. Risks of a vaccine reaction  A sore arm with mild or moderate pain is very common after recombinant shingles vaccine, affecting about 80% of vaccinated people. Redness and swelling can also happen at the site of the injection.  Tiredness, muscle pain, headache, shivering, fever, stomach pain, and nausea happen after vaccination in more than half of people who receive recombinant shingles vaccine. In clinical trials, about 1 out of 6 people  who got recombinant zoster vaccine experienced side effects that prevented them from doing regular activities. Symptoms usually went away on their own in 2 to 3 days. You should still get the second dose  of recombinant zoster vaccine even if you had one of these reactions after the first dose. People sometimes faint after medical procedures, including vaccination. Tell your provider if you feel dizzy or have vision changes or ringing in the ears. As with any medicine, there is a very remote chance of a vaccine causing a severe allergic reaction, other serious injury, or death. 5. What if there is a serious problem? An allergic reaction could occur after the vaccinated person leaves the clinic. If you see signs of a severe allergic reaction (hives, swelling of the face and throat, difficulty breathing, a fast heartbeat, dizziness, or weakness), call 9-1-1 and get the person to the nearest hospital. For other signs that concern you, call your health care provider. Adverse reactions should be reported to the Vaccine Adverse Event Reporting System (VAERS). Your health care provider will usually file this report, or you can do it yourself. Visit the VAERS website at www.vaers.SamedayNews.es or call (551)337-2682. VAERS is only for reporting reactions, and VAERS staff do not give medical advice. 6. How can I learn more?  Ask your health care provider.  Call your local or state health department.  Contact the Centers for Disease Control and Prevention (CDC): ? Call (512)216-0611 (1-800-CDC-INFO) or ? Visit CDC's website at http://hunter.com/ Vaccine Information Statement Recombinant Zoster Vaccine (01/19/2018) This information is not intended to replace advice given to you by your health care provider. Make sure you discuss any questions you have with your health care provider. Document Revised: 06/28/2018 Document Reviewed: 10/13/2017 Elsevier Patient Education  [place].

## 2020-01-22 NOTE — Progress Notes (Signed)
Subjective:    Patient ID: Tony Hernandez, male    DOB: 12-11-1955, 63 y.o.   MRN: [id number]  HPI  This visit occurred during the SARS-CoV-2 public health emergency.  Safety protocols were in place, including screening questions prior to the visit, additional usage of staff PPE, and extensive cleaning of exam room while observing appropriate contact time as indicated for disinfecting solutions.   Tony Hernandez is a 64 year old male with a history of GERD, hypothyroidism, elevated PSA with prostate cancer who presents today with a chief complaint of knee pain. He is overdue for his second shingles vaccine.  His pain is located to the posterior and medial side of the left knee which began years ago, but worse over the last several months. His pain is worse during his work day, he walks on concrete and lifts heavy objects for his work day. Improves with rest.   He denies injury/trauma, redness. Sometimes he has noticed swelling. He's taken Advil, Naproxen with some improvement. He has not tried wearing a knee brace. He's never undergone treatment for his left knee pain, but he did undergo xray years ago which revealed arthritis. He's never been checked for gout.   BP Readings from Last 3 Encounters:  01/22/20 110/74  12/20/19 (!) 151/93  09/20/19 (!) 153/98     Review of Systems  Constitutional: Negative for fever.  Musculoskeletal: Positive for arthralgias. Negative for joint swelling.  Skin: Negative for color change.       Past Medical History:  Diagnosis Date  . Arthritis    knees  . GERD (gastroesophageal reflux disease)   . Thyroid disease   . Wears dentures      Social History   Socioeconomic History  . Marital status: Single    Spouse name: Not on file  . Number of children: Not on file  . Years of education: Not on file  . Highest education level: Not on file  Occupational History  . Not on file  Tobacco Use  . Smoking status: Former Smoker    Years: 10.00    Types:  Cigarettes    Quit date: 06/21/2001    Years since quitting: 18.6  . Smokeless tobacco: Never Used  Vaping Use  . Vaping Use: Never used  Substance and Sexual Activity  . Alcohol use: No    Alcohol/week: 0.0 standard drinks    Comment: quit 18 years ago  . Drug use: Not Currently    Types: Cocaine, Marijuana    Comment: quit 18 years ago   . Sexual activity: Yes    Birth control/protection: None  Other Topics Concern  . Not on file  Social History Narrative   Single.   2 children.   Works as a Administrator.   Enjoys spending time with family.   Social Determinants of Health   Financial Resource Strain:   . Difficulty of Paying Living Expenses: Not on file  Food Insecurity:   . Worried About Charity fundraiser in the Last Year: Not on file  . Ran Out of Food in the Last Year: Not on file  Transportation Needs:   . Lack of Transportation (Medical): Not on file  . Lack of Transportation (Non-Medical): Not on file  Physical Activity:   . Days of Exercise per Week: Not on file  . Minutes of Exercise per Session: Not on file  Stress:   . Feeling of Stress : Not on file  Social Connections:   .  Frequency of Communication with Friends and Family: Not on file  . Frequency of Social Gatherings with Friends and Family: Not on file  . Attends Religious Services: Not on file  . Active Member of Clubs or Organizations: Not on file  . Attends Archivist Meetings: Not on file  . Marital Status: Not on file  Intimate Partner Violence:   . Fear of Current or Ex-Partner: Not on file  . Emotionally Abused: Not on file  . Physically Abused: Not on file  . Sexually Abused: Not on file    Past Surgical History:  Procedure Laterality Date  . CHOLECYSTECTOMY  2018  . COLONOSCOPY WITH PROPOFOL N/A 05/19/2019   Procedure: COLONOSCOPY WITH PROPOFOL;  Surgeon: [name], MD;  Location: [facility];  Service: Endoscopy;  Laterality: N/A;  Priority 4  . POLYPECTOMY   05/19/2019   Procedure: POLYPECTOMY;  Surgeon: [name], MD;  Location: [facility];  Service: Endoscopy;;  . ROBOT ASSISTED LAPAROSCOPIC RADICAL PROSTATECTOMY Bilateral 08/17/2019   Procedure: XI ROBOTIC ASSISTED LAPAROSCOPIC PROSTATECTOMY, BILATERAL LYMPH NODE DISSECTION;  Surgeon: Hollice Espy, MD;  Location: [facility];  Service: Urology;  Laterality: Bilateral;    Family History  Problem Relation Age of Onset  . Lung cancer Mother 19  . Diabetes Paternal Grandmother   . Colon cancer Sister 36    No Known Allergies  Current Outpatient Medications on File Prior to Visit  Medication Sig Dispense Refill  . levothyroxine (SYNTHROID) 150 MCG tablet TAKE 1 TABLET BY MOUTH EVERY DAY (Patient taking differently: Take 150 mcg by mouth daily. ) 90 tablet 3  . tadalafil (CIALIS) 5 MG tablet Take 1 tablet (5 mg total) by mouth daily as needed for erectile dysfunction. 30 tablet 2   Current Facility-Administered Medications on File Prior to Visit  Medication Dose Route Frequency Provider Last Rate Last Admin  . sulfamethoxazole-trimethoprim (BACTRIM DS) 800-160 MG per tablet 1 tablet  1 tablet Oral Q12H Vaillancourt, Samantha, PA-C   1 tablet at 09/20/19 0941    BP 110/74   Pulse 100   Temp 98.4 F (36.9 C) (Temporal)   Ht 5\' 5"  (1.651 m)   Wt 161 lb (73 kg)   SpO2 97%   BMI 26.79 kg/m    Objective:   Physical Exam Pulmonary:     Effort: Pulmonary effort is normal.  Musculoskeletal:        General: No swelling or tenderness.     Left knee: No swelling, deformity, erythema or bony tenderness. Normal range of motion. No tenderness.       Legs:     Comments: 5/5 strength to bilateral lower extremities.   Skin:    General: Skin is warm and dry.     Findings: No erythema.  Neurological:     Mental Status: He is alert.            Assessment & Plan:

## 2020-01-23 LAB — HEPATITIS C ANTIBODY
Hepatitis C Ab: NONREACTIVE
SIGNAL TO CUT-OFF: 0.01 (ref <1.00)

## 2020-01-24 ENCOUNTER — Telehealth: Payer: Self-pay

## 2020-01-24 NOTE — Telephone Encounter (Signed)
Pt left v/m requesting cb about 01/22/20 lt knee xray result.

## 2020-01-25 NOTE — Telephone Encounter (Signed)
Called patient given results that were sent via my chart. He will wear brace and try cream. Will let our office know if any issues or questions.

## 2020-03-07 ENCOUNTER — Ambulatory Visit: Payer: 59 | Admitting: Family Medicine

## 2020-03-07 DIAGNOSIS — Z0289 Encounter for other administrative examinations: Secondary | ICD-10-CM

## 2020-04-08 NOTE — Progress Notes (Signed)
04/09/2020 12:08 PM   Tony Hernandez [birth year] [id number]  Referring provider: Pleas Koch, NP [place],  [place] [address]  Chief Complaint  Patient presents with   Prostate Cancer   Urological history 1. Prostate cancer - PSA <0.1 ng/mL on 11/2019 - history of elevated/fluctuating PSA as high as 12 in 2019. Most recent PSA 7.5 on 04/07/2019 up from the previous year at which time it was 6.55 in 05/2018. Personally discussed MRI with Dr. Jacalyn Lefevre regarding MRI finding. Most recent MRI indicated two regions suspicious for prostate cancer at [facility] category 4 designation. No enlarged lymph nodes or metastasis outside of prostate noted. Nodular BPH enlarged. Obscured by post biopsy hemorrhage however no biopsy performed.MR size 64 g.   He had a fusion prostate bx on 07/27/19. His path report indicates6 of 12 coresinvolved low volume Gleason 3+3, 4+3, 3+4 up to 10% of tissue bilaterally at apex lateral, apex, and mid. He has low risk prostate cancer on left side and intermediate risk unfavorable prostate cancer on right side.  Prostate volume 47 g.  - robot assistedlaparoscopicrightcolectomy on 08/17/19 performed by Dr. Dahlia Byes. He also underwent laparoscopic radical prostatectomy and bilateral pelvic lymph node dissection - surgical pathology on 08/17/2019 showed Gleason 3+4, lowercase pT2, pN0, negative bladder neck, margins, and nodes. No extraprostatic extension  2. SUI - wearing depends   3. ED - risk factors of age, prostate cancer, prostatectomy, former smoker, former drug use and hypothyroidism - failed PDE5i's    HPI: Tony Hernandez is a 65 y.o. male who presents today to discuss treatment for ED.   He has not had a response to Viagra or Cialis.  Patient is not having spontaneous erections.   He denies any pain or curvature with erections.    SHIM    Row Name 04/09/20 1144         SHIM: Over the last 6 months:   How do you rate your confidence that you  could get and keep an erection? Low     When you had erections with sexual stimulation, how often were your erections hard enough for penetration (entering your partner)? Almost Never or Never     During sexual intercourse, how often were you able to maintain your erection after you had penetrated (entered) your partner? Almost Never or Never     During sexual intercourse, how difficult was it to maintain your erection to completion of intercourse? Extremely Difficult     When you attempted sexual intercourse, how often was it satisfactory for you? Almost Never or Never           SHIM Total Score   SHIM 6               PMH: Past Medical History:  Diagnosis Date   Arthritis    knees   GERD (gastroesophageal reflux disease)    Thyroid disease    Wears dentures     Surgical History: Past Surgical History:  Procedure Laterality Date   CHOLECYSTECTOMY  2018   COLONOSCOPY WITH PROPOFOL N/A 05/19/2019   Procedure: COLONOSCOPY WITH PROPOFOL;  Surgeon: [name], MD;  Location: [facility];  Service: Endoscopy;  Laterality: N/A;  Priority 4   POLYPECTOMY  05/19/2019   Procedure: POLYPECTOMY;  Surgeon: [name], MD;  Location: [facility];  Service: Endoscopy;;   ROBOT ASSISTED LAPAROSCOPIC RADICAL PROSTATECTOMY Bilateral 08/17/2019   Procedure: XI ROBOTIC ASSISTED LAPAROSCOPIC PROSTATECTOMY, BILATERAL LYMPH NODE DISSECTION;  Surgeon: Hollice Espy, MD;  Location: [facility];  Service: Urology;  Laterality: Bilateral;    Home Medications:  Allergies as of 04/09/2020   No Known Allergies     Medication List       Accurate as of April 09, 2020 12:08 PM. If you have any questions, ask your nurse or doctor.        diclofenac Sodium 1 % Gel Commonly known as: Voltaren Apply 2 g topically 3 (three) times daily as needed.   levothyroxine 150 MCG tablet Commonly known as: SYNTHROID TAKE 1 TABLET BY MOUTH EVERY DAY   tadalafil 5 MG tablet Commonly  known as: CIALIS Take 1 tablet (5 mg total) by mouth daily as needed for erectile dysfunction.       Allergies: No Known Allergies  Family History: Family History  Problem Relation Age of Onset   Lung cancer Mother 22   Diabetes Paternal Grandmother    Colon cancer Sister 40    Social History:  reports that he quit smoking about 18 years ago. His smoking use included cigarettes. He quit after 10.00 years of use. He has never used smokeless tobacco. He reports previous drug use. Drugs: Cocaine and Marijuana. He reports that he does not drink alcohol.  ROS: Pertinent ROS in HPI  Physical Exam: BP (!) 143/98    Pulse (!) 112    Ht 5\' 5"  (1.651 m)    Wt 156 lb (70.8 kg)    BMI 25.96 kg/m   Constitutional:  Well nourished. Alert and oriented, No acute distress. HEENT: [place] AT, mask in place.  Trachea midline Cardiovascular: No clubbing, cyanosis, or edema. Respiratory: Normal respiratory effort, no increased work of breathing. Neurologic: Grossly intact, no focal deficits, moving all 4 extremities. Psychiatric: Normal mood and affect.  Laboratory Data: Lab Results  Component Value Date   WBC 7.6 01/22/2020   HGB 13.4 01/22/2020   HCT 40.8 01/22/2020   MCV 74.6 (L) 01/22/2020   PLT 359.0 01/22/2020    Lab Results  Component Value Date   CREATININE 1.28 (H) 08/30/2019    Lab Results  Component Value Date   PSA 7.5 (H) 04/07/2019   PSA 6.55 (H) 06/09/2018    Lab Results  Component Value Date   HGBA1C 6.1 (H) 08/22/2019    Lab Results  Component Value Date   TSH 2.96 04/07/2019       Component Value Date/Time   CHOL 142 04/07/2019 1511   HDL 34 (L) 04/07/2019 1511   CHOLHDL 4.2 04/07/2019 1511   LDLCALC 85 04/07/2019 1511    Lab Results  Component Value Date   AST 22 08/30/2019   Lab Results  Component Value Date   ALT 126 (H) 08/30/2019   Urinalysis    Component Value Date/Time   COLORURINE YELLOW (A) 08/26/2019 1217   APPEARANCEUR CLOUDY (A)  08/26/2019 1217   APPEARANCEUR Clear 08/08/2019 1303   LABSPEC 1.023 08/26/2019 1217   PHURINE 6.0 08/26/2019 1217   GLUCOSEU 50 (A) 08/26/2019 1217   HGBUR LARGE (A) 08/26/2019 1217   BILIRUBINUR NEGATIVE 08/26/2019 1217   BILIRUBINUR Negative 08/08/2019 1303   KETONESUR NEGATIVE 08/26/2019 1217   PROTEINUR >=300 (A) 08/26/2019 1217   NITRITE NEGATIVE 08/26/2019 1217   LEUKOCYTESUR LARGE (A) 08/26/2019 1217    I have reviewed the labs.   Pertinent Imaging: No recent imaging since last visit  Assessment & Plan:    1. Erectile dysfunction SHIM score is 6 Failed PDE5i's We discussed trying  ICI and he would like to schedule a titration He will return for a ICI titration appointment   2. Prostate cancer Labs 06/18/2020   Return for ICI titration.  These notes generated with voice recognition software. I apologize for typographical errors.  Zara Council, PA-C  [facility] [address]  [place], [place] [address] [phone]  I spent 25 minutes on the day of the encounter to include pre-visit record review, face-to-face time with the patient, and post-visit ordering of tests.

## 2020-04-09 ENCOUNTER — Ambulatory Visit (INDEPENDENT_AMBULATORY_CARE_PROVIDER_SITE_OTHER): Payer: 59 | Admitting: Urology

## 2020-04-09 ENCOUNTER — Encounter: Payer: Self-pay | Admitting: Urology

## 2020-04-09 ENCOUNTER — Other Ambulatory Visit: Payer: Self-pay

## 2020-04-09 VITALS — BP 143/98 | HR 112 | Ht 65.0 in | Wt 156.0 lb

## 2020-04-09 DIAGNOSIS — N5231 Erectile dysfunction following radical prostatectomy: Secondary | ICD-10-CM

## 2020-04-16 ENCOUNTER — Ambulatory Visit: Payer: Self-pay | Admitting: Urology

## 2020-04-16 NOTE — Progress Notes (Deleted)
Mr. Schwartz presents today for a Edex titration.  He is no longer/still having spontaneous erections. ***   He has had no response to PDE5i's.  ***He has contraindications to PDE5i's. ***   He has had moderate response to PDE5i's. ***   He denies any history of sickle cell anemia or trait, a history of multiple myeloma or a history of leukemia.  ***    He has not taken trazodone or a PDE5i's today.  ***  Patient's left corpus cavernosum is identified.  An area near the base of the penis is cleansed with rubbing alcohol.  Careful to avoid the dorsal vein, 2 mcg of Trimix (papaverine 30 mg, phentolamine 1 mg and prostaglandin E1 10 mcg, Lot # [id number]  exp # 12/24/2016) is injected at a 90 degree angle into the left corpus cavernosum near the base of the penis.  Patient experienced a very firm erection in 15 minutes.    Advised patient of the condition of priapism, painful erection lasting for more than four hours, and to contact the office immediately or seek treatment in the ED

## 2020-04-17 ENCOUNTER — Ambulatory Visit: Payer: Self-pay | Admitting: Urology

## 2020-04-23 NOTE — Progress Notes (Signed)
Mr. Ledgerwood presents today for a Edex titration.  He is no longer having spontaneous erections.  He has had no response to PDE5i's.   He denies any history of sickle cell anemia or trait, a history of multiple myeloma or a history of leukemia.    He has not taken trazodone or a PDE5i's today.    Patient's left corpus cavernosum is identified.  An area near the base of the penis is cleansed with rubbing alcohol.  Careful to avoid the dorsal vein, 0.25 mcg of Edex 20 mcg (Lot # [id number] exp # 07/2021) is injected at a 90 degree angle into the left corpus cavernosum near the base of the penis.  Patient experienced a soft erection in 15 minutes.    Patient's right corpus cavernosum is identified.  An area near the base of the penis is cleansed with rubbing alcohol.  Careful to avoid the dorsal vein, 0.25 mcg of Edex 20 mcg (Lot # [id number] exp # 07/2021) is injected at a 90 degree angle into the left corpus cavernosum near the base of the penis.  Patient experienced a firm erection in 15 minutes.     Advised patient of the condition of priapism, painful erection lasting for more than four hours, and to contact the office immediately or seek treatment in the ED

## 2020-04-24 ENCOUNTER — Ambulatory Visit (INDEPENDENT_AMBULATORY_CARE_PROVIDER_SITE_OTHER): Payer: 59 | Admitting: Urology

## 2020-04-24 ENCOUNTER — Other Ambulatory Visit: Payer: Self-pay

## 2020-04-24 ENCOUNTER — Encounter: Payer: Self-pay | Admitting: Urology

## 2020-04-24 VITALS — BP 142/90 | HR 78 | Ht 65.0 in | Wt 150.0 lb

## 2020-04-24 DIAGNOSIS — N5231 Erectile dysfunction following radical prostatectomy: Secondary | ICD-10-CM | POA: Diagnosis not present

## 2020-04-28 ENCOUNTER — Telehealth: Payer: Self-pay | Admitting: Urology

## 2020-04-28 NOTE — Telephone Encounter (Signed)
Please reschedule Tony Hernandez's appointment on the 10th.  If he is wanting to pursue intracavernousal injections, we need to get him a prescription for Trimix from [facility] and he needs a morning appointment.

## 2020-04-29 MED ORDER — NONFORMULARY OR COMPOUNDED ITEM: 0 refills | Status: DC

## 2020-04-29 NOTE — Addendum Note (Signed)
Addended by: Verlene Mayer A on: 04/29/2020 04:10 PM   Modules accepted: Orders

## 2020-04-29 NOTE — Telephone Encounter (Signed)
Appointment has been changed.

## 2020-04-29 NOTE — Telephone Encounter (Signed)
Spoke with patient regarding appointment for Trimix, patient's appointment was rescheduled in March-patient will be out of town, RX was sent in. Patient voiced understanding.

## 2020-04-30 ENCOUNTER — Ambulatory Visit: Payer: Self-pay | Admitting: Urology

## 2020-05-15 NOTE — Telephone Encounter (Signed)
Opened in error

## 2020-05-22 NOTE — Progress Notes (Deleted)
Mr. Spikes presents today for a Trimix titration.  He is no longer having spontaneous erections.  He has had no response to PDE5i's.   He denies any history of sickle cell anemia or trait, a history of multiple myeloma or a history of leukemia.    He has not taken trazodone or a PDE5i's today.    Patient's left corpus cavernosum is identified.  An area near the base of the penis is cleansed with rubbing alcohol.  Careful to avoid the dorsal vein, 0.25 mcg of Edex 20 mcg (Lot # [id number] exp # 07/2021) is injected at a 90 degree angle into the left corpus cavernosum near the base of the penis.  Patient experienced a soft erection in 15 minutes.    Patient's right corpus cavernosum is identified.  An area near the base of the penis is cleansed with rubbing alcohol.  Careful to avoid the dorsal vein, 0.25 mcg of Edex 20 mcg (Lot # [id number] exp # 07/2021) is injected at a 90 degree angle into the left corpus cavernosum near the base of the penis.  Patient experienced a firm erection in 15 minutes.     Advised patient of the condition of priapism, painful erection lasting for more than four hours, and to contact the office immediately or seek treatment in the ED

## 2020-05-23 ENCOUNTER — Ambulatory Visit: Payer: Self-pay | Admitting: Urology

## 2020-05-23 DIAGNOSIS — N5231 Erectile dysfunction following radical prostatectomy: Secondary | ICD-10-CM

## 2020-06-18 ENCOUNTER — Other Ambulatory Visit: Payer: 59

## 2020-06-18 ENCOUNTER — Other Ambulatory Visit: Payer: Self-pay

## 2020-06-18 DIAGNOSIS — C61 Malignant neoplasm of prostate: Secondary | ICD-10-CM

## 2020-06-19 LAB — PSA: Prostate Specific Ag, Serum: <0.1 ng/mL (ref 0.0–4.0)

## 2020-06-24 ENCOUNTER — Telehealth: Payer: Self-pay

## 2020-06-24 NOTE — Telephone Encounter (Signed)
Patient called wanting to know PSA results. PSA results given, normal. Patient told to follow up as scheduled

## 2020-07-10 ENCOUNTER — Other Ambulatory Visit: Payer: Self-pay

## 2020-07-10 MED ORDER — NONFORMULARY OR COMPOUNDED ITEM: 0 refills | Status: DC

## 2020-07-10 NOTE — Telephone Encounter (Signed)
Patient called stating he would like to try Trimix injections. Last time he was unable due to medication back order. Script was resent to [facility]. Address was sent to patient via mychart. He verbalized understanding that he would need to call the office and schedule a first morning 8am injection teaching appointment with [facility] once he picked up the medication.

## 2020-07-17 ENCOUNTER — Ambulatory Visit (INDEPENDENT_AMBULATORY_CARE_PROVIDER_SITE_OTHER): Payer: 59 | Admitting: Primary Care

## 2020-07-17 ENCOUNTER — Encounter: Payer: Self-pay | Admitting: Primary Care

## 2020-07-17 ENCOUNTER — Other Ambulatory Visit: Payer: Self-pay | Admitting: Primary Care

## 2020-07-17 ENCOUNTER — Other Ambulatory Visit: Payer: Self-pay

## 2020-07-17 VITALS — BP 126/78 | HR 89 | Temp 98.6°F | Ht 65.0 in | Wt 167.0 lb

## 2020-07-17 DIAGNOSIS — K219 Gastro-esophageal reflux disease without esophagitis: Secondary | ICD-10-CM | POA: Diagnosis not present

## 2020-07-17 DIAGNOSIS — E039 Hypothyroidism, unspecified: Secondary | ICD-10-CM

## 2020-07-17 DIAGNOSIS — Z87898 Personal history of other specified conditions: Secondary | ICD-10-CM

## 2020-07-17 DIAGNOSIS — G8929 Other chronic pain: Secondary | ICD-10-CM

## 2020-07-17 DIAGNOSIS — M25562 Pain in left knee: Secondary | ICD-10-CM | POA: Diagnosis not present

## 2020-07-17 DIAGNOSIS — Z0000 Encounter for general adult medical examination without abnormal findings: Secondary | ICD-10-CM

## 2020-07-17 NOTE — Assessment & Plan Note (Signed)
Recent PSA level at goal. Follows with Urology.

## 2020-07-17 NOTE — Progress Notes (Signed)
Subjective:    Patient ID: Tony Hernandez, male    DOB: [birth year], 65 y.o.   MRN: [id number]  HPI  Tony Hernandez is a very pleasant 65 y.o. male who presents today for complete physical.  Immunizations: -Tetanus: 2021 -Influenza: Did not complete -Covid-19: 3 vaccines -Shingles: Completed 2 vaccines   Diet: He endorses a healthy diet. Exercise: He is active at work  Eye exam: No recent exam Dental exam: No recent exam  Colonoscopy: 2021, due in 2026 PSA: Due   BP Readings from Last 3 Encounters:  07/17/20 126/78  04/24/20 (!) 142/90  04/09/20 (!) 143/98       Review of Systems  Constitutional: Negative for unexpected weight change.  HENT: Negative for rhinorrhea.   Respiratory: Negative for cough and shortness of breath.   Cardiovascular: Negative for chest pain.  Gastrointestinal: Negative for constipation and diarrhea.  Genitourinary: Negative for difficulty urinating.  Musculoskeletal: Positive for arthralgias.  Skin: Negative for rash.  Allergic/Immunologic: Negative for environmental allergies.  Neurological: Negative for dizziness and headaches.  Psychiatric/Behavioral: The patient is not nervous/anxious.          Past Medical History:  Diagnosis Date  . Arthritis    knees  . GERD (gastroesophageal reflux disease)   . Thyroid disease   . Wears dentures     Social History   Socioeconomic History  . Marital status: Single    Spouse name: Not on file  . Number of children: Not on file  . Years of education: Not on file  . Highest education level: Not on file  Occupational History  . Not on file  Tobacco Use  . Smoking status: Former Smoker    Years: 10.00    Types: Cigarettes    Quit date: 06/21/2001    Years since quitting: 19.0  . Smokeless tobacco: Never Used  Vaping Use  . Vaping Use: Never used  Substance and Sexual Activity  . Alcohol use: No    Alcohol/week: 0.0 standard drinks    Comment: quit 18 years ago  . Drug use: Not  Currently    Types: Cocaine, Marijuana    Comment: quit 18 years ago   . Sexual activity: Yes    Birth control/protection: None  Other Topics Concern  . Not on file  Social History Narrative   Single.   2 children.   Works as a Administrator.   Enjoys spending time with family.   Social Determinants of Health   Financial Resource Strain: Not on file  Food Insecurity: Not on file  Transportation Needs: Not on file  Physical Activity: Not on file  Stress: Not on file  Social Connections: Not on file  Intimate Partner Violence: Not on file    Past Surgical History:  Procedure Laterality Date  . CHOLECYSTECTOMY  2018  . COLONOSCOPY WITH PROPOFOL N/A 05/19/2019   Procedure: COLONOSCOPY WITH PROPOFOL;  Surgeon: [name], MD;  Location: [facility];  Service: Endoscopy;  Laterality: N/A;  Priority 4  . POLYPECTOMY  05/19/2019   Procedure: POLYPECTOMY;  Surgeon: [name], MD;  Location: [facility];  Service: Endoscopy;;  . ROBOT ASSISTED LAPAROSCOPIC RADICAL PROSTATECTOMY Bilateral 08/17/2019   Procedure: XI ROBOTIC ASSISTED LAPAROSCOPIC PROSTATECTOMY, BILATERAL LYMPH NODE DISSECTION;  Surgeon: Hollice Espy, MD;  Location: [facility];  Service: Urology;  Laterality: Bilateral;    Family History  Problem Relation Age of Onset  . Lung cancer Mother 67  . Diabetes Paternal Grandmother   .  Colon cancer Sister 54    No Known Allergies  Current Outpatient Medications on File Prior to Visit  Medication Sig Dispense Refill  . diclofenac Sodium (VOLTAREN) 1 % GEL Apply 2 g topically 3 (three) times daily as needed. 100 g 1  . levothyroxine (SYNTHROID) 150 MCG tablet TAKE 1 TABLET BY MOUTH EVERY DAY (Patient taking differently: Take 150 mcg by mouth daily.) 90 tablet 3  . NONFORMULARY OR COMPOUNDED ITEM Trimix (30/1/10)-(Pap/Phent/PGE)  Test Dose  58ml vial   Qty #3 Refills 0  [place] [phone] Fax [phone] 3 each 0  . omeprazole  (PRILOSEC) 20 MG capsule Take 20 mg by mouth daily.    . tadalafil (CIALIS) 5 MG tablet Take 1 tablet (5 mg total) by mouth daily as needed for erectile dysfunction. 30 tablet 2   Current Facility-Administered Medications on File Prior to Visit  Medication Dose Route Frequency Provider Last Rate Last Admin  . sulfamethoxazole-trimethoprim (BACTRIM DS) 800-160 MG per tablet 1 tablet  1 tablet Oral Q12H Vaillancourt, Samantha, PA-C   1 tablet at 09/20/19 0941    There were no vitals taken for this visit. Objective:   Physical Exam HENT:     Right Ear: Tympanic membrane and ear canal normal.     Left Ear: Tympanic membrane and ear canal normal.     Nose: Nose normal.     Right Sinus: No maxillary sinus tenderness or frontal sinus tenderness.     Left Sinus: No maxillary sinus tenderness or frontal sinus tenderness.  Eyes:     Conjunctiva/sclera: Conjunctivae normal.     Pupils: Pupils are equal, round, and reactive to light.  Neck:     Thyroid: No thyromegaly.     Vascular: No carotid bruit.  Cardiovascular:     Rate and Rhythm: Normal rate and regular rhythm.     Heart sounds: Normal heart sounds.  Pulmonary:     Effort: Pulmonary effort is normal.     Breath sounds: Normal breath sounds. No wheezing or rales.  Abdominal:     General: Bowel sounds are normal.     Palpations: Abdomen is soft.     Tenderness: There is no abdominal tenderness.  Musculoskeletal:        General: Normal range of motion.     Cervical back: Neck supple.  Skin:    General: Skin is warm and dry.  Neurological:     Mental Status: He is alert and oriented to person, place, and time.     Cranial Nerves: No cranial nerve deficit.     Deep Tendon Reflexes: Reflexes are normal and symmetric.  Psychiatric:        Mood and Affect: Mood normal.           Assessment & Plan:      This visit occurred during the SARS-CoV-2 public health emergency.  Safety protocols were in place, including screening  questions prior to the visit, additional usage of staff PPE, and extensive cleaning of exam room while observing appropriate contact time as indicated for disinfecting solutions.

## 2020-07-17 NOTE — Assessment & Plan Note (Signed)
Immunizations UTD PSA UTD and reviewed. Colonoscopy UTD, due in 2026.  Discussed the importance of a healthy diet and regular exercise in order for weight loss, and to reduce the risk of any potential medical problems.  Exam today stable. Labs pending and also reviewed.

## 2020-07-17 NOTE — Telephone Encounter (Signed)
Refill request Levothyroxine Last office visit today Last refill 05/15/19 #90/3

## 2020-07-17 NOTE — Patient Instructions (Addendum)
Stop by the lab prior to leaving today. I will notify you of your results once received.   It was a pleasure to see you today!   Preventive Care 10-63 Years Old, Male Preventive care refers to lifestyle choices and visits with your health care provider that can promote health and wellness. This includes:  A yearly physical exam. This is also called an annual wellness visit.  Regular dental and eye exams.  Immunizations.  Screening for certain conditions.  Healthy lifestyle choices, such as: ? Eating a healthy diet. ? Getting regular exercise. ? Not using drugs or products that contain nicotine and tobacco. ? Limiting alcohol use. What can I expect for my preventive care visit? Physical exam Your health care provider will check your:  Height and weight. These may be used to calculate your BMI (body mass index). BMI is a measurement that tells if you are at a healthy weight.  Heart rate and blood pressure.  Body temperature.  Skin for abnormal spots. Counseling Your health care provider may ask you questions about your:  Past medical problems.  Family's medical history.  Alcohol, tobacco, and drug use.  Emotional well-being.  Home life and relationship well-being.  Sexual activity.  Diet, exercise, and sleep habits.  Work and work Statistician.  Access to firearms. What immunizations do I need? Vaccines are usually given at various ages, according to a schedule. Your health care provider will recommend vaccines for you based on your age, medical history, and lifestyle or other factors, such as travel or where you work.   What tests do I need? Blood tests  Lipid and cholesterol levels. These may be checked every 5 years, or more often if you are over 40 years old.  Hepatitis C test.  Hepatitis B test. Screening  Lung cancer screening. You may have this screening every year starting at age 40 if you have a 30-pack-year history of smoking and currently smoke  or have quit within the past 15 years.  Prostate cancer screening. Recommendations will vary depending on your family history and other risks.  Genital exam to check for testicular cancer or hernias.  Colorectal cancer screening. ? All adults should have this screening starting at age 62 and continuing until age 33. ? Your health care provider may recommend screening at age 39 if you are at increased risk. ? You will have tests every 1-10 years, depending on your results and the type of screening test.  Diabetes screening. ? This is done by checking your blood sugar (glucose) after you have not eaten for a while (fasting). ? You may have this done every 1-3 years.  STD (sexually transmitted disease) testing, if you are at risk. Follow these instructions at home: Eating and drinking  Eat a diet that includes fresh fruits and vegetables, whole grains, lean protein, and low-fat dairy products.  Take vitamin and mineral supplements as recommended by your health care provider.  Do not drink alcohol if your health care provider tells you not to drink.  If you drink alcohol: ? Limit how much you have to 0-2 drinks a day. ? Be aware of how much alcohol is in your drink. In the [place] one drink equals one 12 oz bottle of beer (355 mL), one 5 oz glass of wine (148 mL), or one 1 oz glass of hard liquor (44 mL).   Lifestyle  Take daily care of your teeth and gums. Brush your teeth every morning and night with fluoride toothpaste. Floss  one time each day.  Stay active. Exercise for at least 30 minutes 5 or more days each week.  Do not use any products that contain nicotine or tobacco, such as cigarettes, e-cigarettes, and chewing tobacco. If you need help quitting, ask your health care provider.  Do not use drugs.  If you are sexually active, practice safe sex. Use a condom or other form of protection to prevent STIs (sexually transmitted infections).  If told by your health care provider,  take low-dose aspirin daily starting at age 41.  Find healthy ways to cope with stress, such as: ? Meditation, yoga, or listening to music. ? Journaling. ? Talking to a trusted person. ? Spending time with friends and family. Safety  Always wear your seat belt while driving or riding in a vehicle.  Do not drive: ? If you have been drinking alcohol. Do not ride with someone who has been drinking. ? When you are tired or distracted. ? While texting.  Wear a helmet and other protective equipment during sports activities.  If you have firearms in your house, make sure you follow all gun safety procedures. What's next?  Go to your health care provider once a year for an annual wellness visit.  Ask your health care provider how often you should have your eyes and teeth checked.  Stay up to date on all vaccines. This information is not intended to replace advice given to you by your health care provider. Make sure you discuss any questions you have with your health care provider. Document Revised: 12/06/2018 Document Reviewed: 03/03/2018 Elsevier Patient Education  2021 [employer].

## 2020-07-17 NOTE — Assessment & Plan Note (Signed)
Chronic and ongoing, overall stable with Voltaren Gel. Continue to monitor.

## 2020-07-17 NOTE — Assessment & Plan Note (Signed)
He is taking levothyroxine 150 mcg correctly, continue same for now. Repeat TSH pending.

## 2020-07-17 NOTE — Assessment & Plan Note (Signed)
No concerns today, continue to monitor.

## 2020-07-18 LAB — COMPREHENSIVE METABOLIC PANEL
ALT: 19 U/L (ref 0–53)
AST: 16 U/L (ref 0–37)
Albumin: 4.1 g/dL (ref 3.5–5.2)
Alkaline Phosphatase: 66 U/L (ref 39–117)
BUN: 14 mg/dL (ref 6–23)
CO2: 26 mEq/L (ref 19–32)
Calcium: 9.5 mg/dL (ref 8.4–10.5)
Chloride: 104 mEq/L (ref 96–112)
Creatinine, Ser: 1.16 mg/dL (ref 0.40–1.50)
GFR: 67.33 mL/min (ref >60.00)
Glucose, Bld: 130 mg/dL — ABNORMAL HIGH (ref 70–99)
Potassium: 4.2 mEq/L (ref 3.5–5.1)
Sodium: 137 mEq/L (ref 135–145)
Total Bilirubin: 0.3 mg/dL (ref 0.2–1.2)
Total Protein: 6.6 g/dL (ref 6.0–8.3)

## 2020-07-18 LAB — LIPID PANEL
Cholesterol: 134 mg/dL (ref 0–200)
HDL: 42.1 mg/dL (ref >39.00)
LDL Cholesterol: 74 mg/dL (ref 0–99)
NonHDL: 91.58
Total CHOL/HDL Ratio: 3
Triglycerides: 89 mg/dL (ref 0.0–149.0)
VLDL: 17.8 mg/dL (ref 0.0–40.0)

## 2020-07-18 LAB — TSH: TSH: 6.4 u[IU]/mL — ABNORMAL HIGH (ref 0.35–4.50)

## 2020-07-22 NOTE — Progress Notes (Signed)
07/23/2020 8:39 AM  Tony Hernandez 1955-08-01 [id number]   Referring provider: Pleas Koch, NP [place],  [place] [address]   Chief Complaint  Patient presents with  . Erectile Dysfunction   Urological history: 1. Prostate cancer - PSA <0.1 ng/mL in 05/2020 - history of elevated/fluctuating PSA as high as 12 in 2019. Most recent PSA 7.5 on 04/07/2019 up from the previous year at which time it was 6.55 in 05/2018. Personally discussed MRI with Dr. Jacalyn Lefevre regarding MRI finding. Most recent MRI indicated two regions suspicious for prostate cancer at [facility] category 4 designation. No enlarged lymph nodes or metastasis outside of prostate noted. Nodular BPH enlarged. Obscured by post biopsy hemorrhage however no biopsy performed.MR size 64 g.  He had afusionprostate bx on5/6/21. His path report indicates6of 12 coresinvolved low volume Gleason 3+3, 4+3, 3+4up to 10% of tissuebilaterally at apex lateral, apex, and mid.He has low risk prostate cancer on left side and intermediate risk unfavorable prostate cancer on right side.Prostate volume 47 g.  - robot assistedlaparoscopicrightcolectomy on 08/17/19 performed by Dr. Dahlia Byes.He also underwent laparoscopic radical prostatectomy and bilateral pelvic lymph node dissection - surgical pathology on 08/17/2019 showed Gleason 3+4, lowercase pT2, pN0, negative bladder neck, margins, and nodes. No extraprostatic extension  2. SUI - wearing depends   3. ED - risk factors of age, prostate cancer, prostatectomy, former smoker, former drug use and hypothyroidism - failed PDE5i's    HPI: Tony Hernandez is a 65 y.o. male who presents today for Trimix titration.  He is no longer having spontaneous erections.  He has had no response to PDE5i's. He denies any history of sickle cell anemia or trait, a history of multiple myeloma or a history of leukemia.    He has not taken trazodone or a PDE5i's today.     Physical Exam:  BP  (!) 142/93   Pulse 97   Ht '5\' 5"'  (1.651 m)   Wt 160 lb (72.6 kg)   BMI 26.63 kg/m   Constitutional:  Well nourished. Alert and oriented, No acute distress. GU: No CVA tenderness.  No bladder fullness or masses.  Patient with circumcised phallus.  Urethral meatus is patent.  No penile discharge. No penile lesions or rashes.  Psychiatric: Normal mood and affect.   Procedure  Patient's left corpus cavernosum is identified.  An area near the base of the penis is cleansed with rubbing alcohol.  Careful to avoid the dorsal vein, 3 mcg of Trimix (papaverine 30 mg, phentolamine 1 mg and prostaglandin E1 10 mcg, Lot # [id number]'  exp # 08/17/2020 is injected at a 90 degree angle into the left corpus cavernosum near the base of the penis.  Patient experienced a very firm erection in 15 minutes.     Assessment & Plan:    1. ED -patient with a satisfactory erection with 3 mcg of Trimix (30/1/10)  -he is advised not to inject with Trimix more often than every other day -Advised patient of the condition of priapism, painful erection lasting for more than four hours, and to contact the office immediately or seek treatment in the ED    Return for follow up as scheduled with Dr. 02/21/09 on 12/24/2020 .  23/06/2020  [facility] [address] [place], [place] [phone]   I spent 15 minutes on the day of the encounter to include pre-visit record review, face-to-face time with the patient, and post-visit ordering  of tests.

## 2020-07-23 ENCOUNTER — Encounter: Payer: Self-pay | Admitting: Urology

## 2020-07-23 ENCOUNTER — Other Ambulatory Visit: Payer: Self-pay

## 2020-07-23 ENCOUNTER — Ambulatory Visit: Payer: 59 | Admitting: Urology

## 2020-07-23 VITALS — BP 142/93 | HR 97 | Ht 65.0 in | Wt 160.0 lb

## 2020-07-23 DIAGNOSIS — N5231 Erectile dysfunction following radical prostatectomy: Secondary | ICD-10-CM

## 2020-07-23 NOTE — Patient Instructions (Signed)

## 2020-08-07 ENCOUNTER — Other Ambulatory Visit: Payer: Self-pay | Admitting: Primary Care

## 2020-08-07 DIAGNOSIS — E039 Hypothyroidism, unspecified: Secondary | ICD-10-CM

## 2020-08-20 ENCOUNTER — Other Ambulatory Visit: Payer: Self-pay

## 2020-08-20 ENCOUNTER — Other Ambulatory Visit (INDEPENDENT_AMBULATORY_CARE_PROVIDER_SITE_OTHER): Payer: 59

## 2020-08-20 DIAGNOSIS — E039 Hypothyroidism, unspecified: Secondary | ICD-10-CM

## 2020-08-20 LAB — TSH: TSH: 6.96 u[IU]/mL — ABNORMAL HIGH (ref 0.35–4.50)

## 2020-09-18 ENCOUNTER — Other Ambulatory Visit: Payer: Self-pay | Admitting: Primary Care

## 2020-09-18 DIAGNOSIS — E039 Hypothyroidism, unspecified: Secondary | ICD-10-CM

## 2020-10-02 ENCOUNTER — Other Ambulatory Visit (INDEPENDENT_AMBULATORY_CARE_PROVIDER_SITE_OTHER): Payer: 59

## 2020-10-02 ENCOUNTER — Other Ambulatory Visit: Payer: Self-pay

## 2020-10-02 DIAGNOSIS — E039 Hypothyroidism, unspecified: Secondary | ICD-10-CM | POA: Diagnosis not present

## 2020-10-02 LAB — TSH: TSH: 2.76 u[IU]/mL (ref 0.35–5.50)

## 2020-10-08 ENCOUNTER — Encounter: Payer: Self-pay | Admitting: Primary Care

## 2020-10-08 ENCOUNTER — Other Ambulatory Visit: Payer: Self-pay

## 2020-10-08 ENCOUNTER — Ambulatory Visit (INDEPENDENT_AMBULATORY_CARE_PROVIDER_SITE_OTHER): Payer: Worker's Compensation | Admitting: Primary Care

## 2020-10-08 DIAGNOSIS — M25511 Pain in right shoulder: Secondary | ICD-10-CM

## 2020-10-08 DIAGNOSIS — G8929 Other chronic pain: Secondary | ICD-10-CM

## 2020-10-08 DIAGNOSIS — M25512 Pain in left shoulder: Secondary | ICD-10-CM

## 2020-10-08 MED ORDER — PREDNISONE 20 MG PO TABS: ORAL_TABLET | ORAL | 0 refills | Status: DC

## 2020-10-08 NOTE — Assessment & Plan Note (Signed)
Acute on chronic flare with radiculopathy.   Exam today suspicious for mild rotator cuff tear, discussed this with patient today. Recommended Sports Medicine/ortho evaluation. He declines for now.  Will treat with oral prednisone for now. Discussed that he needs to rest. Agree to provide work note for restrictions, he should not be lifting over 10 pounds. Okay to drive.

## 2020-10-08 NOTE — Patient Instructions (Signed)
You should rest your arms and shoulders as discussed.  You can take a medication for now to help with your symptoms, this is called prednisone.   Take 3 tablets by mouth once daily for 2 days, then 2 tablets by mouth once daily for 3 days, then 1 tablet once daily for 3 days.   Schedule a visit with Dr. Lorelei Pont if no improvement.   It was a pleasure to see you today!

## 2020-10-08 NOTE — Progress Notes (Signed)
Subjective:    Patient ID: Tony Hernandez, male    DOB: 12-18-1955, 66 y.o.   MRN: [id number]  HPI  Tony Hernandez is a very pleasant 65 y.o. male with a history of chronic knee pain, hypothyroidism who presents today to discuss chronic shoulder pain.   Chronic for 6+ months. His pain is located to the bilateral anterior shoulders for which increased in intensity a few weeks ago after moving heavy equipment in [place], [place]. He works setting up for dog shows, often throws heavy objects over his shoulders and lifts heavy objects (50-85 pounds). During his visit in [place] be noticed a sudden onset of numbness to his entire right arm, could not move his fist.   Since then he's noticed intermittent numbness/tingling to bilateral upper extremities, more constant shoulder pain. His shoulders hurt daily, right side more than left. He had to drive long distances, across country. 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 He has a job in [place] scheduled for next week.   He was told by his sister that he may need to take FMLA. He would like a letter for lifting restrictions given his symptoms and pain.    Review of Systems  Musculoskeletal:  Positive for arthralgias.  Neurological:  Positive for weakness and numbness.        Past Medical History:  Diagnosis Date   Arthritis    knees   GERD (gastroesophageal reflux disease)    Thyroid disease    Wears dentures     Social History   Socioeconomic History   Marital status: Single    Spouse name: Not on file   Number of children: Not on file   Years of education: Not on file   Highest education level: Not on file  Occupational History   Not on file  Tobacco Use   Smoking status: Former    Years: 10.00    Types: Cigarettes    Quit date: 06/21/2001    Years since quitting: 19.3   Smokeless tobacco: Never  Vaping Use   Vaping Use: Never used  Substance and Sexual  Activity   Alcohol use: No    Alcohol/week: 0.0 standard drinks    Comment: quit 18 years ago   Drug use: Not Currently    Types: Cocaine, Marijuana    Comment: quit 18 years ago    Sexual activity: Yes    Birth control/protection: None  Other Topics Concern   Not on file  Social History Narrative   Single.   2 children.   Works as a Administrator.   Enjoys spending time with family.   Social Determinants of Health   Financial Resource Strain: Not on file  Food Insecurity: Not on file  Transportation Needs: Not on file  Physical Activity: Not on file  Stress: Not on file  Social Connections: Not on file  Intimate Partner Violence: Not on file    Past Surgical History:  Procedure Laterality Date   CHOLECYSTECTOMY  2018   COLONOSCOPY WITH PROPOFOL N/A 05/19/2019   Procedure: COLONOSCOPY WITH PROPOFOL;  Surgeon: [name], MD;  Location: [facility];  Service: Endoscopy;  Laterality: N/A;  Priority 4   POLYPECTOMY  05/19/2019   Procedure: POLYPECTOMY;  Surgeon: [name], MD;  Location: [facility];  Service: Endoscopy;;   ROBOT ASSISTED LAPAROSCOPIC RADICAL PROSTATECTOMY Bilateral 08/17/2019   Procedure: XI ROBOTIC  ASSISTED LAPAROSCOPIC PROSTATECTOMY, BILATERAL LYMPH NODE DISSECTION;  Surgeon: Hollice Espy, MD;  Location: [facility];  Service: Urology;  Laterality: Bilateral;    Family History  Problem Relation Age of Onset   Lung cancer Mother 36   Diabetes Paternal Grandmother    Colon cancer Sister 13    No Known Allergies  Current Outpatient Medications on File Prior to Visit  Medication Sig Dispense Refill   diclofenac Sodium (VOLTAREN) 1 % GEL Apply 2 g topically 3 (three) times daily as needed. 100 g 1   levothyroxine (SYNTHROID) 150 MCG tablet Take 1 tablet by mouth every morning on an empty stomach with water only.  No food or other medications for 30 minutes. 90 tablet 0   NONFORMULARY OR COMPOUNDED ITEM Trimix  (30/1/10)-(Pap/Phent/PGE)  Test Dose  32ml vial   Qty #3 [place] [phone] Fax [phone] 3 each 0   No current facility-administered medications on file prior to visit.    BP 120/62   Pulse 82   Temp 97.9 F (36.6 C) (Temporal)   Ht 5\' 5"  (1.651 m)   Wt 162 lb (73.5 kg)   SpO2 97%   BMI 26.96 kg/m  Objective:   Physical Exam Pulmonary:     Effort: Pulmonary effort is normal.  Musculoskeletal:     Right shoulder: Normal range of motion. Normal strength.     Left shoulder: Normal range of motion. Normal strength.     Comments: Pain with ROM, abduction (anterior and posterior). 5/5 strength to bilateral upper extremities.   Positive empty can test to right upper extremity.   Skin:    General: Skin is warm and dry.  Neurological:     Mental Status: He is alert.          Assessment & Plan:      This visit occurred during the SARS-CoV-2 public health emergency.  Safety protocols were in place, including screening questions prior to the visit, additional usage of staff PPE, and extensive cleaning of exam room while observing appropriate contact time as indicated for disinfecting solutions.

## 2020-10-12 ENCOUNTER — Other Ambulatory Visit: Payer: Self-pay | Admitting: Primary Care

## 2020-10-12 DIAGNOSIS — E039 Hypothyroidism, unspecified: Secondary | ICD-10-CM

## 2020-12-19 ENCOUNTER — Other Ambulatory Visit: Payer: Self-pay

## 2020-12-19 DIAGNOSIS — C61 Malignant neoplasm of prostate: Secondary | ICD-10-CM

## 2020-12-23 ENCOUNTER — Other Ambulatory Visit: Payer: 59

## 2020-12-23 ENCOUNTER — Other Ambulatory Visit: Payer: Self-pay

## 2020-12-23 DIAGNOSIS — C61 Malignant neoplasm of prostate: Secondary | ICD-10-CM

## 2020-12-23 NOTE — Progress Notes (Signed)
12/24/20 2:30 PM   Tony Hernandez June 17, 1955 [id number]  Referring provider:  Pleas Koch, NP [place],  [place] [address] Chief Complaint  Patient presents with   Prostate Cancer    1year w/PSA prior     HPI: Tony Hernandez is a 65 y.o.male with a personal history of prostate cancer, stress urinary incontinence, and erectile dysfunction, who presents today for 1 year follow-up with PSA.   He is s/p fusion prostate  biopsy on 07/27/2019. Pathology revealed 6 of 12 cores involved in low volume Gleason 3+3, 4+3, 3+4 up to 10% of tissue bilaterally at apex lateral, apex, and mid. He has low risk prostate cancer on left side and intermediate risk unfavorable prostate cancer on right side.  Prostate volume 47 g. iPSA 12.  He is s/p robotic assisted laparoscopic ight colectomy on 08/17/2019 as well as laparoscopic radical prostatectomy and bilateral pelvic lymph node dissection. surgical pathology on 08/17/2019 showed Gleason 3+4, lowercase pT2, pN0, negative bladder neck, margins, and nodes. No extraprostatic extension   He reports today that he has experienced a little leakage but it is not of concern to him.   He is most concerned with his erections. He has had a spontaneous nighttime erection.  He tried to use intracavernosal injections in the past was not with this as well at home.  He wonders if he is injecting this appropriately.  He has not been injecting it at a 90 degree angle at the midshaft.  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  He has not tried sildenafil or oral medications in quite some time.  PMH: Past Medical History:  Diagnosis Date   Arthritis    knees   GERD (gastroesophageal reflux disease)    Thyroid disease    Wears dentures     Surgical History: Past Surgical History:  Procedure Laterality Date   CHOLECYSTECTOMY  2018   COLONOSCOPY WITH PROPOFOL N/A 05/19/2019   Procedure: COLONOSCOPY  WITH PROPOFOL;  Surgeon: [name], MD;  Location: [facility];  Service: Endoscopy;  Laterality: N/A;  Priority 4   POLYPECTOMY  05/19/2019   Procedure: POLYPECTOMY;  Surgeon: [name], MD;  Location: [facility];  Service: Endoscopy;;   ROBOT ASSISTED LAPAROSCOPIC RADICAL PROSTATECTOMY Bilateral 08/17/2019   Procedure: XI ROBOTIC ASSISTED LAPAROSCOPIC PROSTATECTOMY, BILATERAL LYMPH NODE DISSECTION;  Surgeon: Hollice Espy, MD;  Location: [facility];  Service: Urology;  Laterality: Bilateral;    Home Medications:  Allergies as of 12/24/2020   No Known Allergies      Medication List        Accurate as of December 24, 2020  2:30 PM. If you have any questions, ask your nurse or doctor.          STOP taking these medications    predniSONE 20 MG tablet Commonly known as: DELTASONE Stopped by: Hollice Espy, MD       TAKE these medications    diclofenac Sodium 1 % Gel Commonly known as: Voltaren Apply 2 g topically 3 (three) times daily as needed.   levothyroxine 150 MCG tablet Commonly known as: SYNTHROID TAKE 1 TABLET BY MOUTH EVERY MORNING ON AN EMPTY STOMACH WITH WATER ONLY. NO FOOD OR OTHER MEDICATIONS FOR 30 MINUTES.   NONFORMULARY OR COMPOUNDED ITEM Trimix (30/1/10)-(Pap/Phent/PGE)  Test Dose  33ml vial   Qty #3 [place] [phone] Fax  [phone]        Allergies: No Known Allergies  Family History: Family History  Problem Relation Age of Onset   Lung cancer Mother 25   Diabetes Paternal Grandmother    Colon cancer Sister 66    Social History:  reports that he quit smoking about 19 years ago. His smoking use included cigarettes. He has never used smokeless tobacco. He reports that he does not currently use drugs after having used the following drugs: Cocaine and Marijuana. He reports that he does not drink alcohol.   Physical Exam: BP (!) 153/107   Pulse (!) 108   Ht 5\' 5"  (1.651 m)   Wt 169 lb  (76.7 kg)   BMI 28.12 kg/m   Constitutional:  Alert and oriented, No acute distress. HEENT: [place] AT, moist mucus membranes.  Trachea midline, no masses. Cardiovascular: No clubbing, cyanosis, or edema. Respiratory: Normal respiratory effort, no increased work of breathing. Skin: No rashes, bruises or suspicious lesions. Neurologic: Grossly intact, no focal deficits, moving all 4 extremities. Psychiatric: Normal mood and affect.  Laboratory Data:  Lab Results  Component Value Date   CREATININE 1.16 07/17/2020    Lab Results  Component Value Date   PSA 7.5 (H) 04/07/2019   PSA 6.55 (H) 06/09/2018    Lab Results  Component Value Date   HGBA1C 6.1 (H) 08/22/2019      Assessment & Plan:    ED  - Patient with unsatisfactory erection with Trimix (so related to inappropriate technique/administration) - He was prescribed sildenafil for him to try to see if he is able to gain satisfactory erections. He is to try this without doing the injections.  -  Do not take sildenafil and trimix together  - Resume Trimix injection with upping dose if sildenafil does not improve erections. 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 Prostate cancer NED, continue to check PSA q 6 months  PSA only in 6 months, PSA with MD in 12 months  I,[name],acting as a scribe for Hollice Espy, MD.,have documented all relevant documentation on the behalf of Hollice Espy, MD,as directed by  Hollice Espy, MD while in the presence of Hollice Espy, MD.  I have reviewed the above documentation for accuracy and completeness, and I agree with the above.   Hollice Espy, MD   [facility] [address] [place], [place] [address] [phone]

## 2020-12-24 ENCOUNTER — Encounter: Payer: Self-pay | Admitting: Urology

## 2020-12-24 ENCOUNTER — Other Ambulatory Visit: Payer: Self-pay | Admitting: Urology

## 2020-12-24 ENCOUNTER — Ambulatory Visit (INDEPENDENT_AMBULATORY_CARE_PROVIDER_SITE_OTHER): Payer: 59 | Admitting: Urology

## 2020-12-24 VITALS — BP 153/107 | HR 108 | Ht 65.0 in | Wt 169.0 lb

## 2020-12-24 DIAGNOSIS — C61 Malignant neoplasm of prostate: Secondary | ICD-10-CM

## 2020-12-24 LAB — PSA: Prostate Specific Ag, Serum: <0.1 ng/mL (ref 0.0–4.0)

## 2020-12-24 MED ORDER — SILDENAFIL CITRATE 20 MG PO TABS: 20.0000 mg | ORAL_TABLET | Freq: Three times a day (TID) | ORAL | 3 refills | Status: DC

## 2021-01-08 ENCOUNTER — Other Ambulatory Visit: Payer: Self-pay | Admitting: Urology

## 2021-02-14 IMAGING — MR MR PROSTATE WO/W CM
56 series · 56 of 56 positions shown · IV contrast (7.5ml Gadavist)
Comparison: None
COMPARISON: None

Addendum:
CLINICAL DATA: Elevated PSA as high as 12 in 6041 with most recent
PSA of 7.5.

EXAM:
MR PROSTATE WITHOUT AND WITH CONTRAST
TECHNIQUE: Multiplanar multisequence MRI images were obtained of the pelvis
centered about the prostate. Pre and post contrast images were
obtained.
CONTRAST:  7.5mL GADAVIST GADOBUTROL 1 MMOL/ML IV SOLN

[Series 2: ax in&(date) · axial · 6.0mm · 0.74mm/px · 1 of 60 slices shown]
[im 1/60]
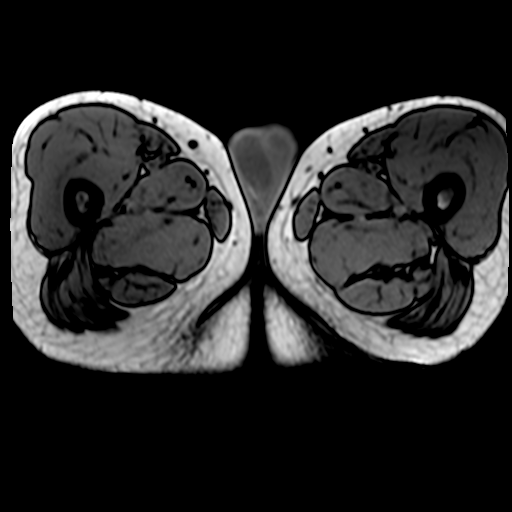

[Series 3: bSSFP fat-sat · axial · 6.0mm · 0.74mm/px · 1 of 30 slices shown]
[im 1/30]
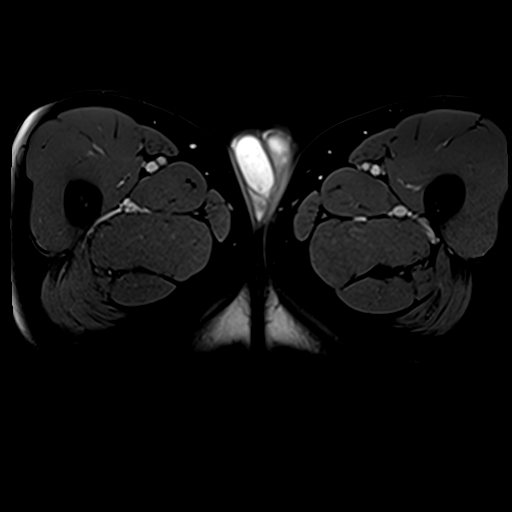

[Series 4: T2 · coronal · 3.0mm · 0.70mm/px · 1 of 30 slices shown (1 of 3)]
[im 1/30]
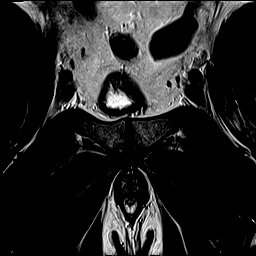

[Series 5: T2 · sagittal · 3.5mm · 0.31mm/px · 1 of 25 slices shown (2 of 3)]
[im 1/25]
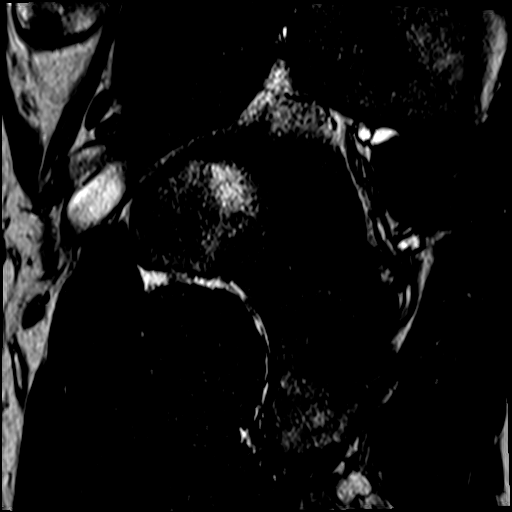

[Series 6: T1 · axial · 3.0mm · 0.35mm/px · 1 of 30 slices shown (1 of 47)]
[im 1/30]
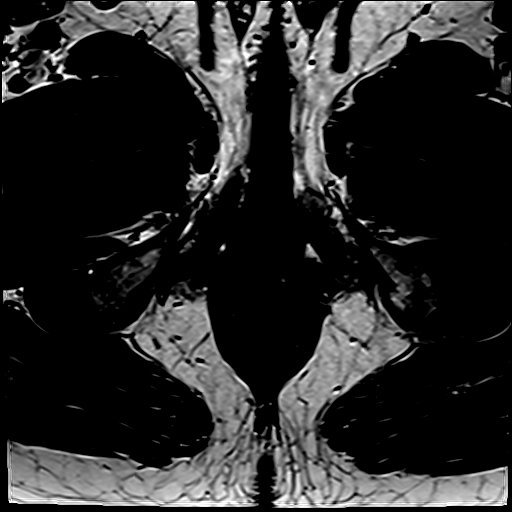

[Series 7: T2 · axial · 3.5mm · 0.56mm/px · 1 of 23 slices shown (3 of 3)]
[im 1/23]
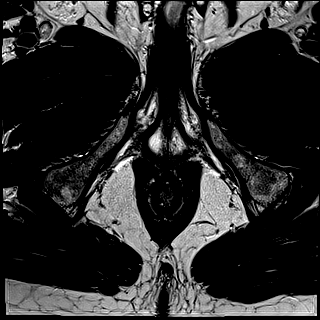

[Series 8: DWI · axial · 3.0mm · 0.86mm/px · 1 of 75 slices shown (1 of 3)]
[im 1/75]
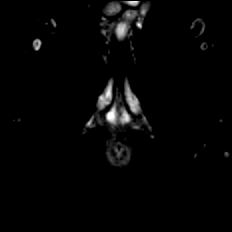

[Series 9: DWI · axial · 3.0mm · 0.86mm/px · 1 of 25 slices shown (2 of 3)]
[im 1/25]
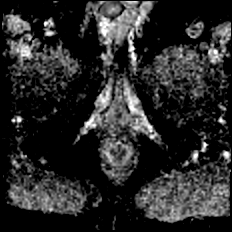

[Series 10: DWI · axial · 3.0mm · 0.86mm/px · 1 of 25 slices shown (3 of 3)]
[im 1/25]
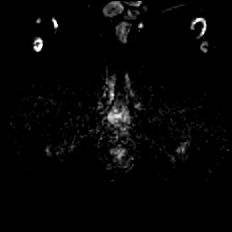

[Series 11: [id number] · axial · 1.0mm · 1.04mm/px · 1 of 80 slices shown]
[im 1/80]
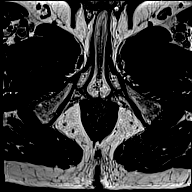

[Series 12: T1 · axial · 3.0mm · 1.15mm/px · 1 of 28 slices shown (2 of 47)]
[im 1/28]
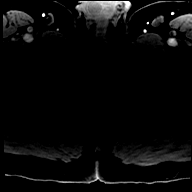

[Series 13: T1 · axial · 3.0mm · 1.15mm/px · 1 of 28 slices shown (3 of 47)]
[im 1/28]
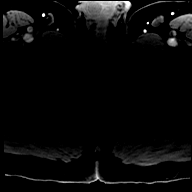

[Series 14: T1 · axial · 3.0mm · 1.15mm/px · 1 of 28 slices shown (4 of 47)]
[im 1/28]
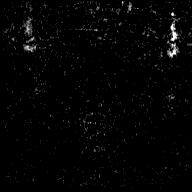

[Series 15: T1 · axial · 3.0mm · 1.15mm/px · 1 of 28 slices shown (5 of 47)]
[im 1/28]
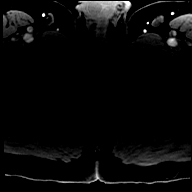

[Series 16: T1 · axial · 3.0mm · 1.15mm/px · 1 of 28 slices shown (6 of 47)]
[im 1/28]
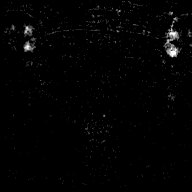

[Series 17: T1 · axial · 3.0mm · 1.15mm/px · 1 of 28 slices shown (7 of 47)]
[im 1/28]
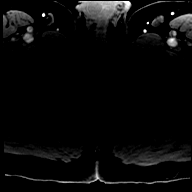

[Series 18: T1 · axial · 3.0mm · 1.15mm/px · 1 of 28 slices shown (8 of 47)]
[im 1/28]
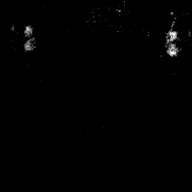

[Series 19: T1 · axial · 3.0mm · 1.15mm/px · 1 of 28 slices shown (9 of 47)]
[im 1/28]
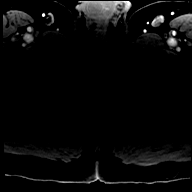

[Series 20: T1 · axial · 3.0mm · 1.15mm/px · 1 of 28 slices shown (10 of 47)]
[im 1/28]
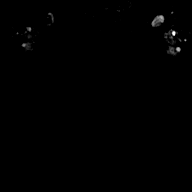

[Series 21: T1 · axial · 3.0mm · 1.15mm/px · 1 of 28 slices shown (11 of 47)]
[im 1/28]
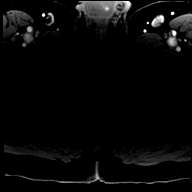

[Series 22: T1 · axial · 3.0mm · 1.15mm/px · 1 of 28 slices shown (12 of 47)]
[im 1/28]
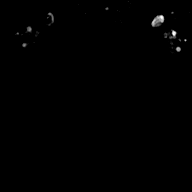

[Series 23: T1 · axial · 3.0mm · 1.15mm/px · 1 of 28 slices shown (13 of 47)]
[im 1/28]
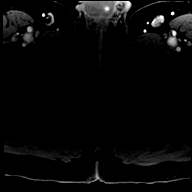

[Series 24: T1 · axial · 3.0mm · 1.15mm/px · 1 of 28 slices shown (14 of 47)]
[im 1/28]
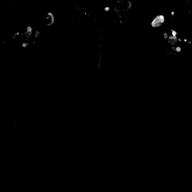

[Series 25: T1 · axial · 3.0mm · 1.15mm/px · 1 of 28 slices shown (15 of 47)]
[im 1/28]
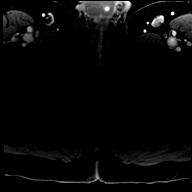

[Series 26: T1 · axial · 3.0mm · 1.15mm/px · 1 of 28 slices shown (16 of 47)]
[im 1/28]
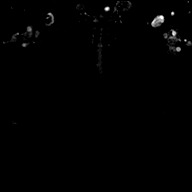

[Series 27: T1 · axial · 3.0mm · 1.15mm/px · 1 of 28 slices shown (17 of 47)]
[im 1/28]
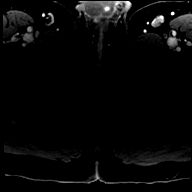

[Series 28: T1 · axial · 3.0mm · 1.15mm/px · 1 of 28 slices shown (18 of 47)]
[im 1/28]
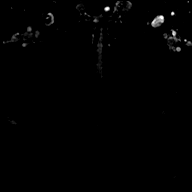

[Series 29: T1 · axial · 3.0mm · 1.15mm/px · 1 of 28 slices shown (19 of 47)]
[im 1/28]
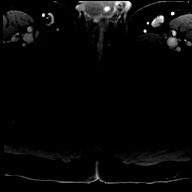

[Series 30: T1 · axial · 3.0mm · 1.15mm/px · 1 of 28 slices shown (20 of 47)]
[im 1/28]
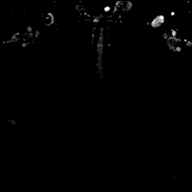

[Series 31: T1 · axial · 3.0mm · 1.15mm/px · 1 of 28 slices shown (21 of 47)]
[im 1/28]
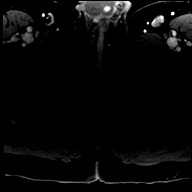

[Series 32: T1 · axial · 3.0mm · 1.15mm/px · 1 of 28 slices shown (22 of 47)]
[im 1/28]
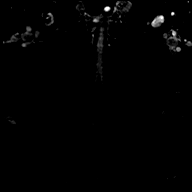

[Series 33: T1 · axial · 3.0mm · 1.15mm/px · 1 of 28 slices shown (23 of 47)]
[im 1/28]
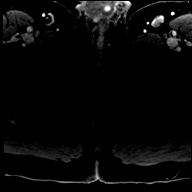

[Series 34: T1 · axial · 3.0mm · 1.15mm/px · 1 of 28 slices shown (24 of 47)]
[im 1/28]
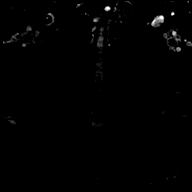

[Series 35: T1 · axial · 3.0mm · 1.15mm/px · 1 of 28 slices shown (25 of 47)]
[im 1/28]
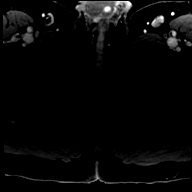

[Series 36: T1 · axial · 3.0mm · 1.15mm/px · 1 of 28 slices shown (26 of 47)]
[im 1/28]
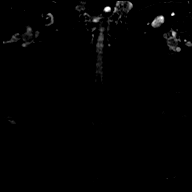

[Series 37: T1 · axial · 3.0mm · 1.15mm/px · 1 of 28 slices shown (27 of 47)]
[im 1/28]
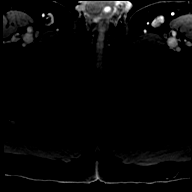

[Series 38: T1 · axial · 3.0mm · 1.15mm/px · 1 of 28 slices shown (28 of 47)]
[im 1/28]
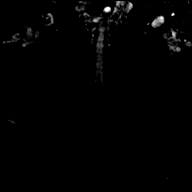

[Series 39: T1 · axial · 3.0mm · 1.15mm/px · 1 of 28 slices shown (29 of 47)]
[im 1/28]
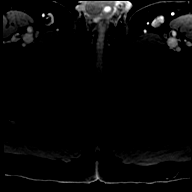

[Series 40: T1 · axial · 3.0mm · 1.15mm/px · 1 of 28 slices shown (30 of 47)]
[im 1/28]
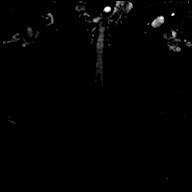

[Series 41: T1 · axial · 3.0mm · 1.15mm/px · 1 of 28 slices shown (31 of 47)]
[im 1/28]
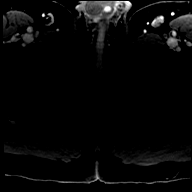

[Series 42: T1 · axial · 3.0mm · 1.15mm/px · 1 of 28 slices shown (32 of 47)]
[im 1/28]
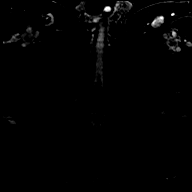

[Series 43: T1 · axial · 3.0mm · 1.15mm/px · 1 of 28 slices shown (33 of 47)]
[im 1/28]
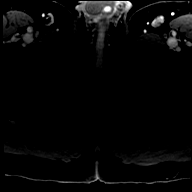

[Series 44: T1 · axial · 3.0mm · 1.15mm/px · 1 of 28 slices shown (34 of 47)]
[im 1/28]
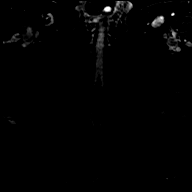

[Series 45: T1 · axial · 3.0mm · 1.15mm/px · 1 of 28 slices shown (35 of 47)]
[im 1/28]
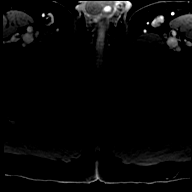

[Series 46: T1 · axial · 3.0mm · 1.15mm/px · 1 of 28 slices shown (36 of 47)]
[im 1/28]
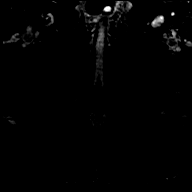

[Series 47: T1 · axial · 3.0mm · 1.15mm/px · 1 of 28 slices shown (37 of 47)]
[im 1/28]
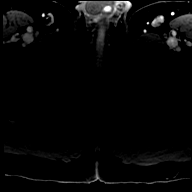

[Series 48: T1 · axial · 3.0mm · 1.15mm/px · 1 of 28 slices shown (38 of 47)]
[im 1/28]
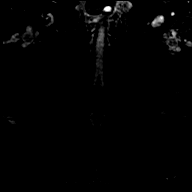

[Series 49: T1 · axial · 3.0mm · 1.15mm/px · 1 of 28 slices shown (39 of 47)]
[im 1/28]
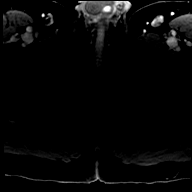

[Series 50: T1 · axial · 3.0mm · 1.15mm/px · 1 of 28 slices shown (40 of 47)]
[im 1/28]
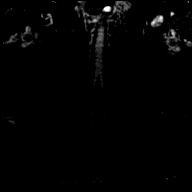

[Series 51: T1 · axial · 3.0mm · 1.15mm/px · 1 of 28 slices shown (41 of 47)]
[im 1/28]
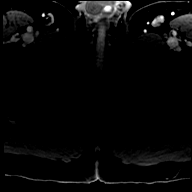

[Series 52: T1 · axial · 3.0mm · 1.15mm/px · 1 of 28 slices shown (42 of 47)]
[im 1/28]
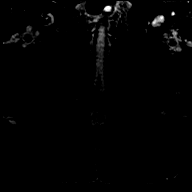

[Series 53: T1 · axial · 3.0mm · 1.15mm/px · 1 of 28 slices shown (43 of 47)]
[im 1/28]
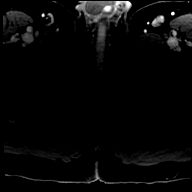

[Series 54: T1 · axial · 3.0mm · 1.15mm/px · 1 of 28 slices shown (44 of 47)]
[im 1/28]
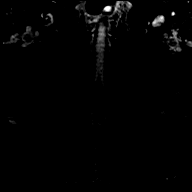

[Series 55: T1 · axial · 3.0mm · 1.15mm/px · 1 of 28 slices shown (45 of 47)]
[im 1/28]
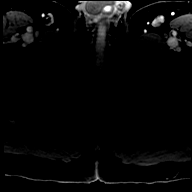

[Series 56: T1 · axial · 3.0mm · 1.15mm/px · 1 of 28 slices shown (46 of 47)]
[im 1/28]
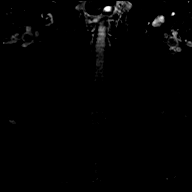

[Series 57: T1 · axial · 3.0mm · 1.15mm/px · 1 of 28 slices shown (47 of 47)]
[im 1/28]
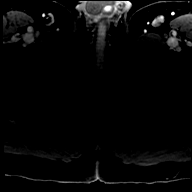

[56 of 56 positions shown; findings below may reference images not displayed]

FINDINGS: Prostate: Evidence of BPH. Wedge-shaped areas in the peripheral zone
with increased T1 signal suggestive of hemorrhage from prior biopsy.

Peripheral zone: Areas of added T1 signal as described. These areas
reflect foci of hemorrhage presumably post biopsy. Area of more
rounded abnormality on the ADC (image 17, series 9) also displaying
restricted diffusion on high B value images but contiguous with
linear area in the gland felt to be related to hemorrhage. PIRADS
category 4 based on potential exclusion. Region 1

10 mm area (image 20, series 9) in the left lateral apex of the
prostate at approximately the 3 to 5 o'clock position also
displaying low signal on ADC and high signal on calculated high B
value images. Not definitely corresponding to an area of hemorrhage.
PIRADS category 4 region 2

Transitional zone: Diffuse low signal in the transitional zone with
circumscribed margins presumed BPH but without the discrete smaller
areas of nodular change that is expected. Left and right gland with
more macro nodular appearance and scattered areas of increased T2
signal. These may represent large stroma rich BPH nodules displaying
uniform diffusion related signal and evidence of restricted
diffusion on high B value diffusion.

Volume: 63.4 cc

Transcapsular spread:  Present/absent/other

Seminal vesicle involvement: present/absent/other

Neurovascular bundle involvement: present/absent/other

Pelvic adenopathy: Absent

Bone metastasis: Absent

Other findings: None
IMPRESSION: Two regions that are suspicious for prostate cancer at PIRADS
category 4 designation. Imaging confounded by the presence of
presumed post biopsy hemorrhage within the peripheral zone.

Changes of BPH with very low signal throughout the central gland
with intermixed areas of high T2 signal such as would be expected in
the setting of large BPH nodules perhaps diffuse stromal rich
changes of BPH. Diffuse neoplasm is felt to be unlikely though if
biopsy has not been performed as is suggested based on changes in
the peripheral zone areas throughout the transitional zone could be
sampled to exclude this possibility.

No signs of extracapsular extension or neurovascular bundle
involvement.

ADDENDUM:
By report after discussing the case with the provider, the patient
has not had prior biopsy. Areas in the prostate may represent
slightly atypical appearance of sequela of prostatitis. Though given
the increased T1 signal hemorrhagic or proteinaceous changes in
these areas are considered.

An area not previously marked in the right hemi prostate
particularly on sagittal view is slightly more mass-like than areas
on the left particularly when viewed in the sagittal plane. PIRADS
category 3 (image 6 of series 7) this area measures approximately 8
x 8 mm, does show restricted diffusion but on some sequences is
wedge-shaped. This area will be marked as region 3, areas in the
axial plane which display less wedge-shaped morphology are marked.

The area marked as region 1 on the prior imaging study is downgraded
to PIRADS category 3, rounded nature of the area on the ADC map is
of moderate suspicion but it is also seen within areas of increased
T1 signal and wedge-shaped and linear changes.

The area previously marked as region 2 is downgraded to PIRADS
category 2 with wedge-shaped appearance favored to represent sequela
of prostatitis.

Diffuse stromal rich BPH changes are favored in the transitional
zone.

*** End of Addendum ***
FINDINGS: Prostate: Evidence of BPH. Wedge-shaped areas in the peripheral zone
with increased T1 signal suggestive of hemorrhage from prior biopsy.

Peripheral zone: Areas of added T1 signal as described. These areas
reflect foci of hemorrhage presumably post biopsy. Area of more
rounded abnormality on the ADC (image 17, series 9) also displaying
restricted diffusion on high B value images but contiguous with
linear area in the gland felt to be related to hemorrhage. PIRADS
category 4 based on potential exclusion. Region 1

10 mm area (image 20, series 9) in the left lateral apex of the
prostate at approximately the 3 to 5 o'clock position also
displaying low signal on ADC and high signal on calculated high B
value images. Not definitely corresponding to an area of hemorrhage.
PIRADS category 4 region 2

Transitional zone: Diffuse low signal in the transitional zone with
circumscribed margins presumed BPH but without the discrete smaller
areas of nodular change that is expected. Left and right gland with
more macro nodular appearance and scattered areas of increased T2
signal. These may represent large stroma rich BPH nodules displaying
uniform diffusion related signal and evidence of restricted
diffusion on high B value diffusion.

Volume: 63.4 cc

Transcapsular spread:  Present/absent/other

Seminal vesicle involvement: present/absent/other

Neurovascular bundle involvement: present/absent/other

Pelvic adenopathy: Absent

Bone metastasis: Absent

Other findings: None
IMPRESSION: Two regions that are suspicious for prostate cancer at PIRADS
category 4 designation. Imaging confounded by the presence of
presumed post biopsy hemorrhage within the peripheral zone.

Changes of BPH with very low signal throughout the central gland
with intermixed areas of high T2 signal such as would be expected in
the setting of large BPH nodules perhaps diffuse stromal rich
changes of BPH. Diffuse neoplasm is felt to be unlikely though if
biopsy has not been performed as is suggested based on changes in
the peripheral zone areas throughout the transitional zone could be
sampled to exclude this possibility.

No signs of extracapsular extension or neurovascular bundle
involvement.

## 2021-02-19 ENCOUNTER — Telehealth: Payer: Self-pay | Admitting: Primary Care

## 2021-02-19 NOTE — Telephone Encounter (Signed)
  Encourage patient to contact the pharmacy for refills or they can request refills through [place]:  Please schedule appointment if longer than 1 year  NEXT APPOINTMENT DATE:03/07/21  MEDICATION:levothyroxine (SYNTHROID) 150 MCG tablet  Is the patient out of medication? no  PHARMACY:[id number] - [place], [place] - [address]  Let patient know to contact pharmacy at the end of the day to make sure medication is ready.  Please notify patient to allow 48-72 hours to process  CLINICAL FILLS OUT ALL BELOW:   LAST REFILL:  QTY:  REFILL DATE:    OTHER COMMENTS:    Okay for refill?  Please advise

## 2021-02-19 NOTE — Telephone Encounter (Signed)
Med was filled on 10/12/20 #90 tabs with 2 refills. Called pt and told him he has refills on file just has to call the pharmacy. Pt verbalized understanding

## 2021-03-07 ENCOUNTER — Ambulatory Visit: Payer: 59 | Admitting: Primary Care

## 2021-04-27 IMAGING — CR DG ABDOMEN 1V
1 series · 2 of 2 positions shown · non-contrast
Comparison: CT abdomen and pelvis July 31, 2019

CLINICAL DATA: Postoperative nausea and vomiting following partial
colectomy and prostatectomy

EXAM:
ABDOMEN - 1 VIEW

[Series 1: dg abd 1 view · 0.14mm/px · 2 of 2 slices shown]
[im 1/2]
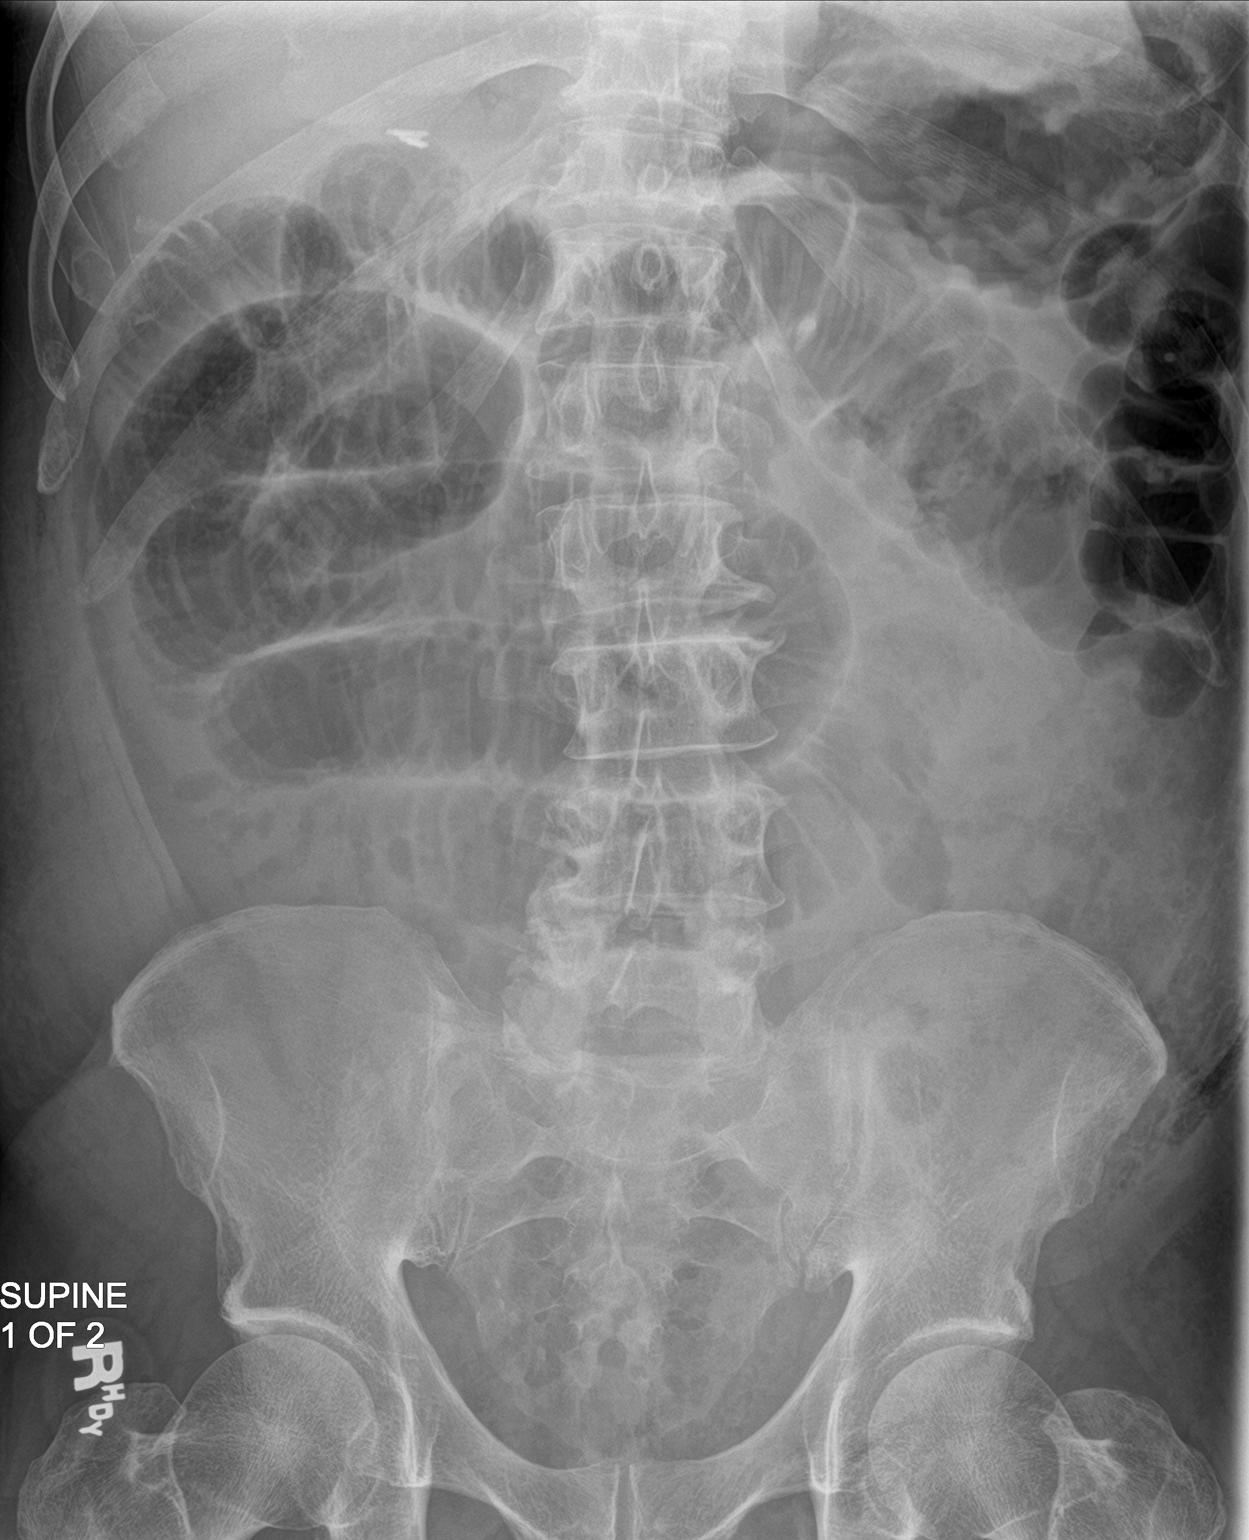
[im 2/2]
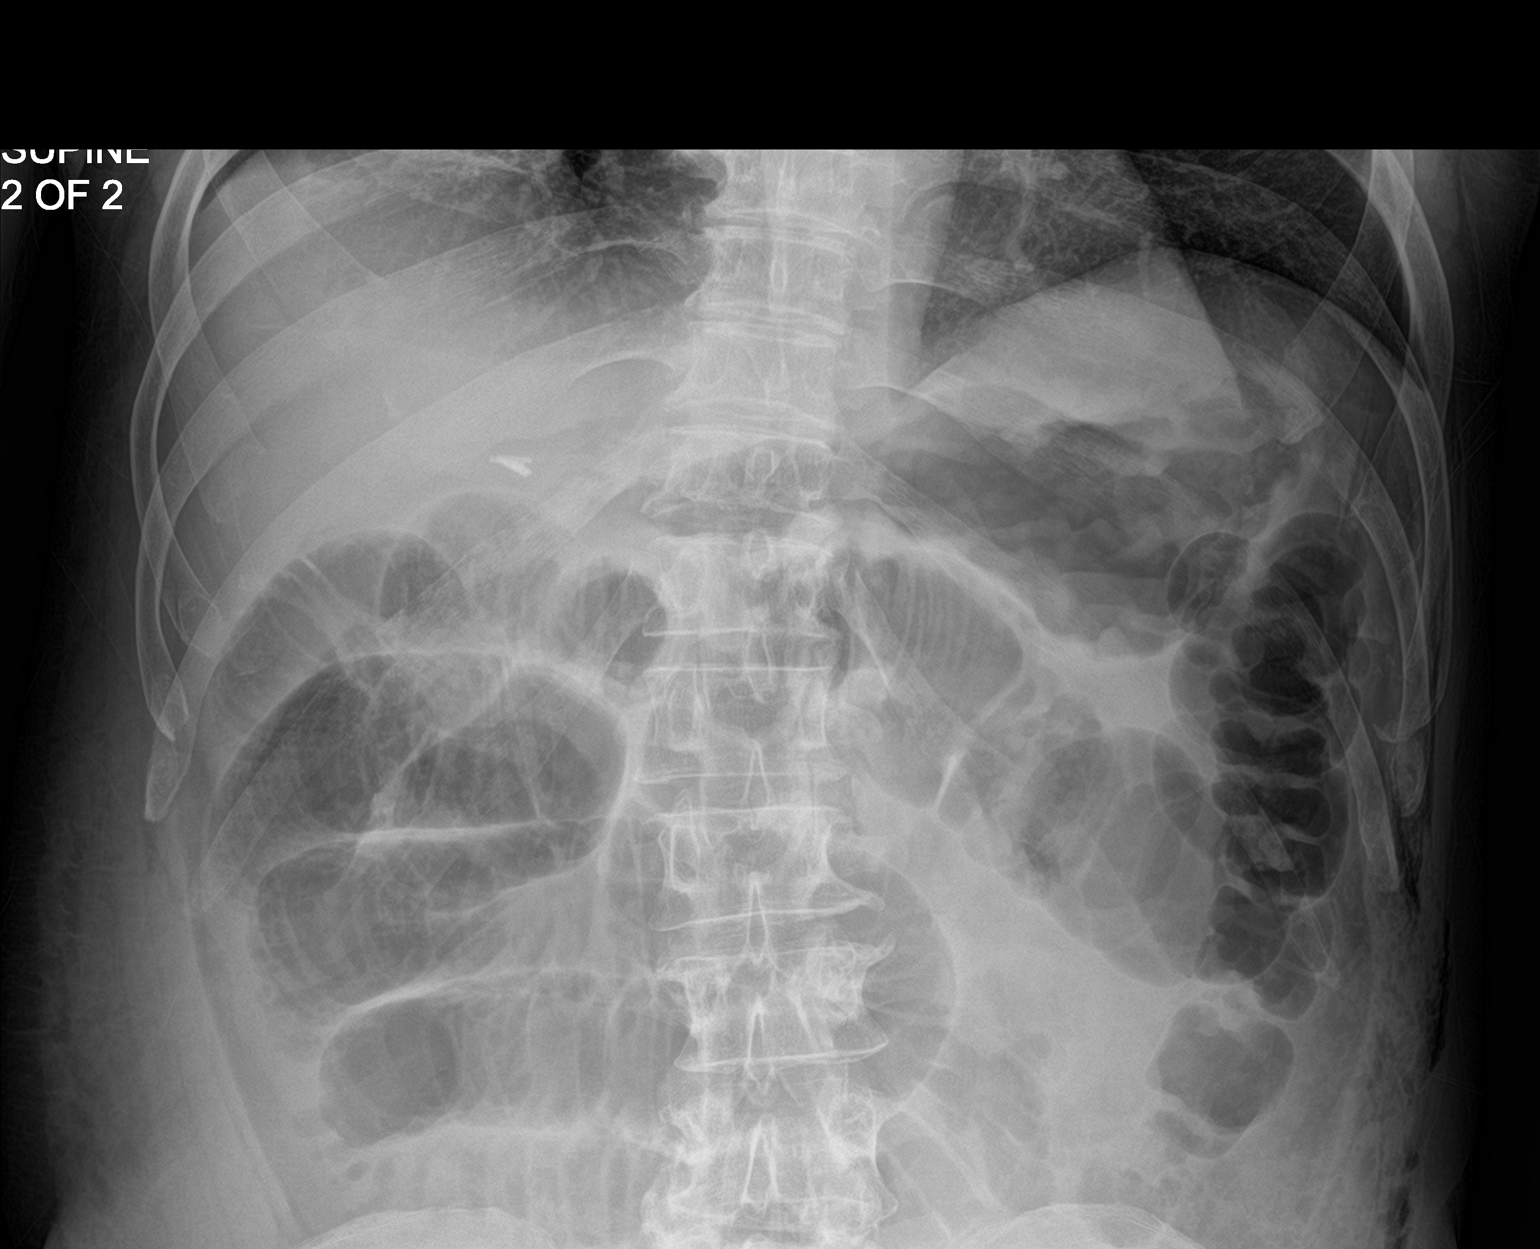

[2 of 2 positions shown; findings below may reference images not displayed]

FINDINGS: There are multiple loops of dilated small bowel without appreciable
air-fluid levels. No free air. There is bibasilar atelectasis. There
are surgical clips in the right upper quadrant.
IMPRESSION: Multiple loops of dilated bowel. Question postoperative ileus versus
developing bowel obstruction. Bowel obstruction is not excluded in
this circumstance. No free air. Surgical clips right upper quadrant.

## 2021-05-02 IMAGING — DX DG ABD PORTABLE 2V
2 series · 2 of 2 positions shown · non-contrast
Comparison: August 24, 2019

CLINICAL DATA: Ileus.  Recent abdominal surgery

EXAM:
PORTABLE ABDOMEN - 2 VIEW

[abdomen erect]
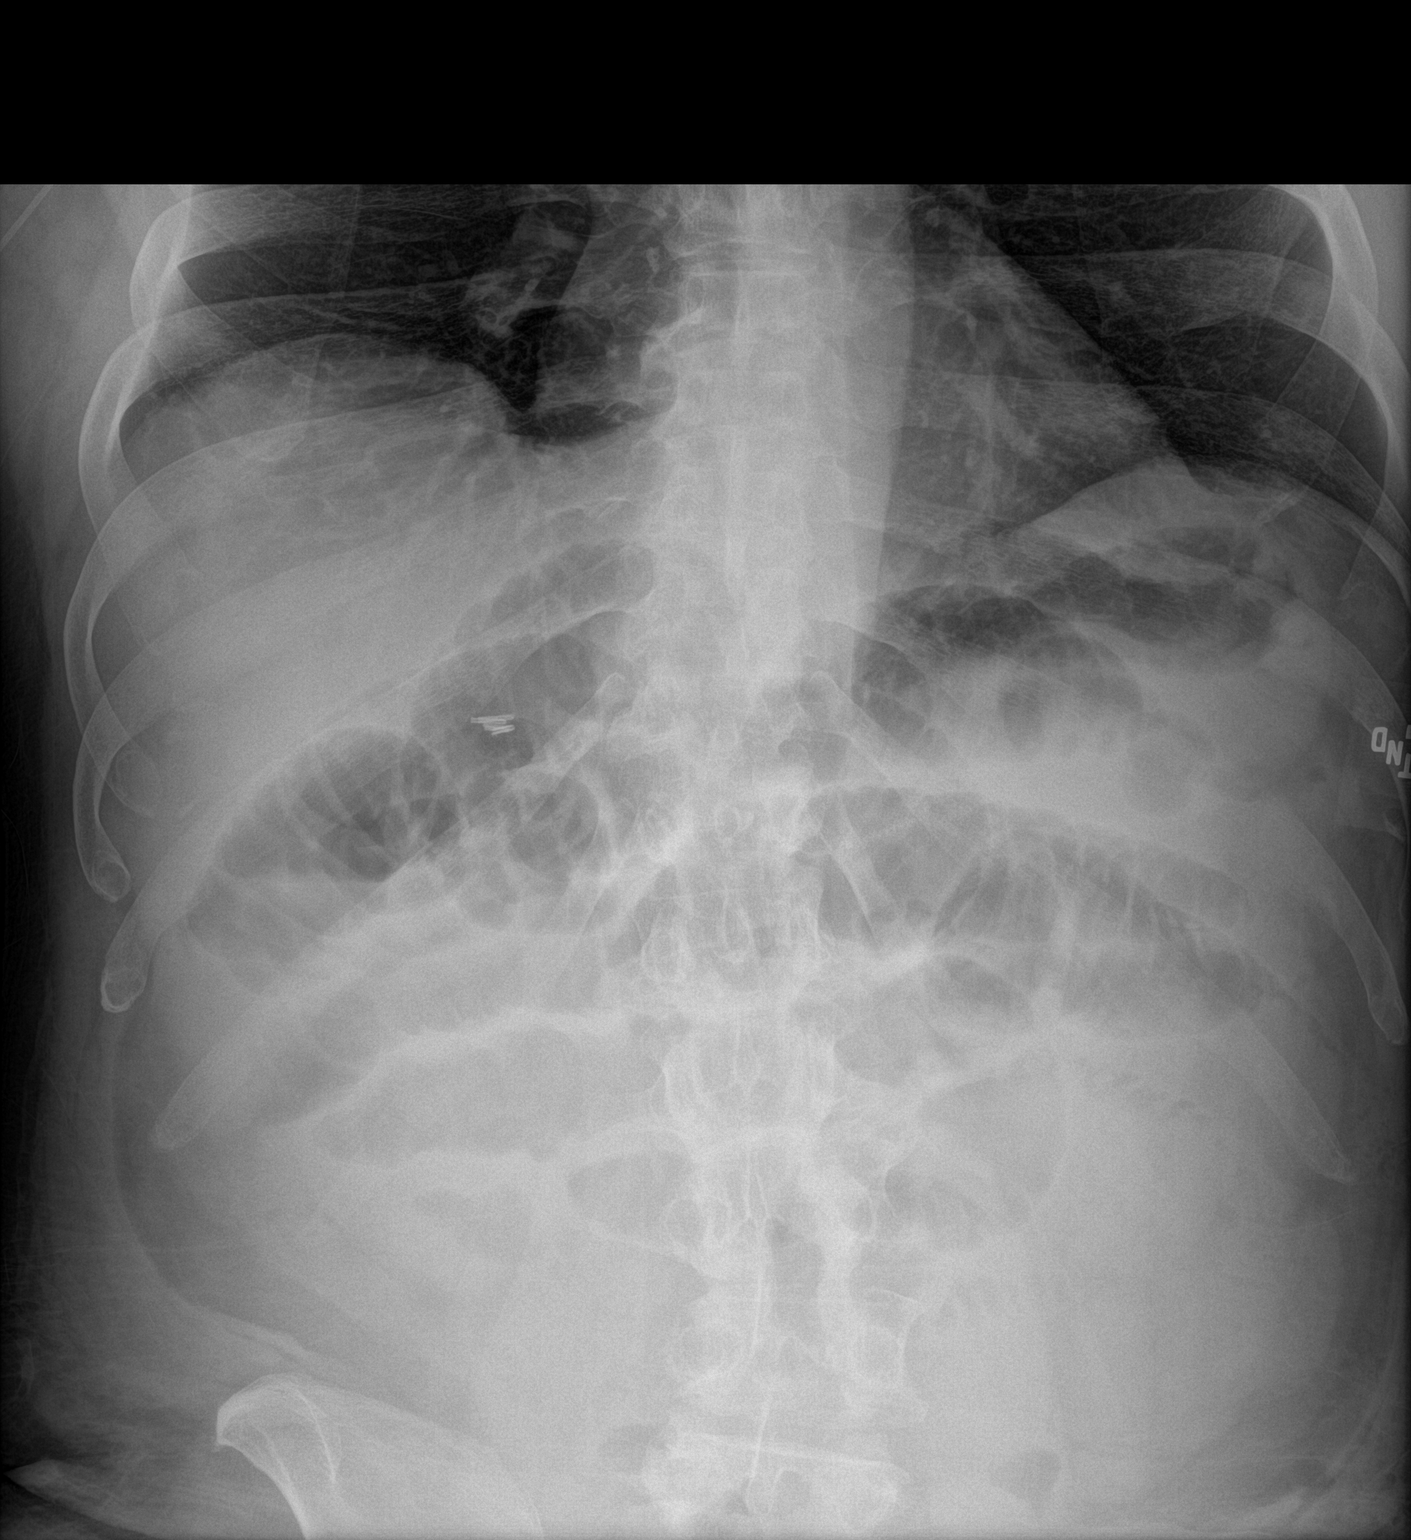

[abdomen supine]
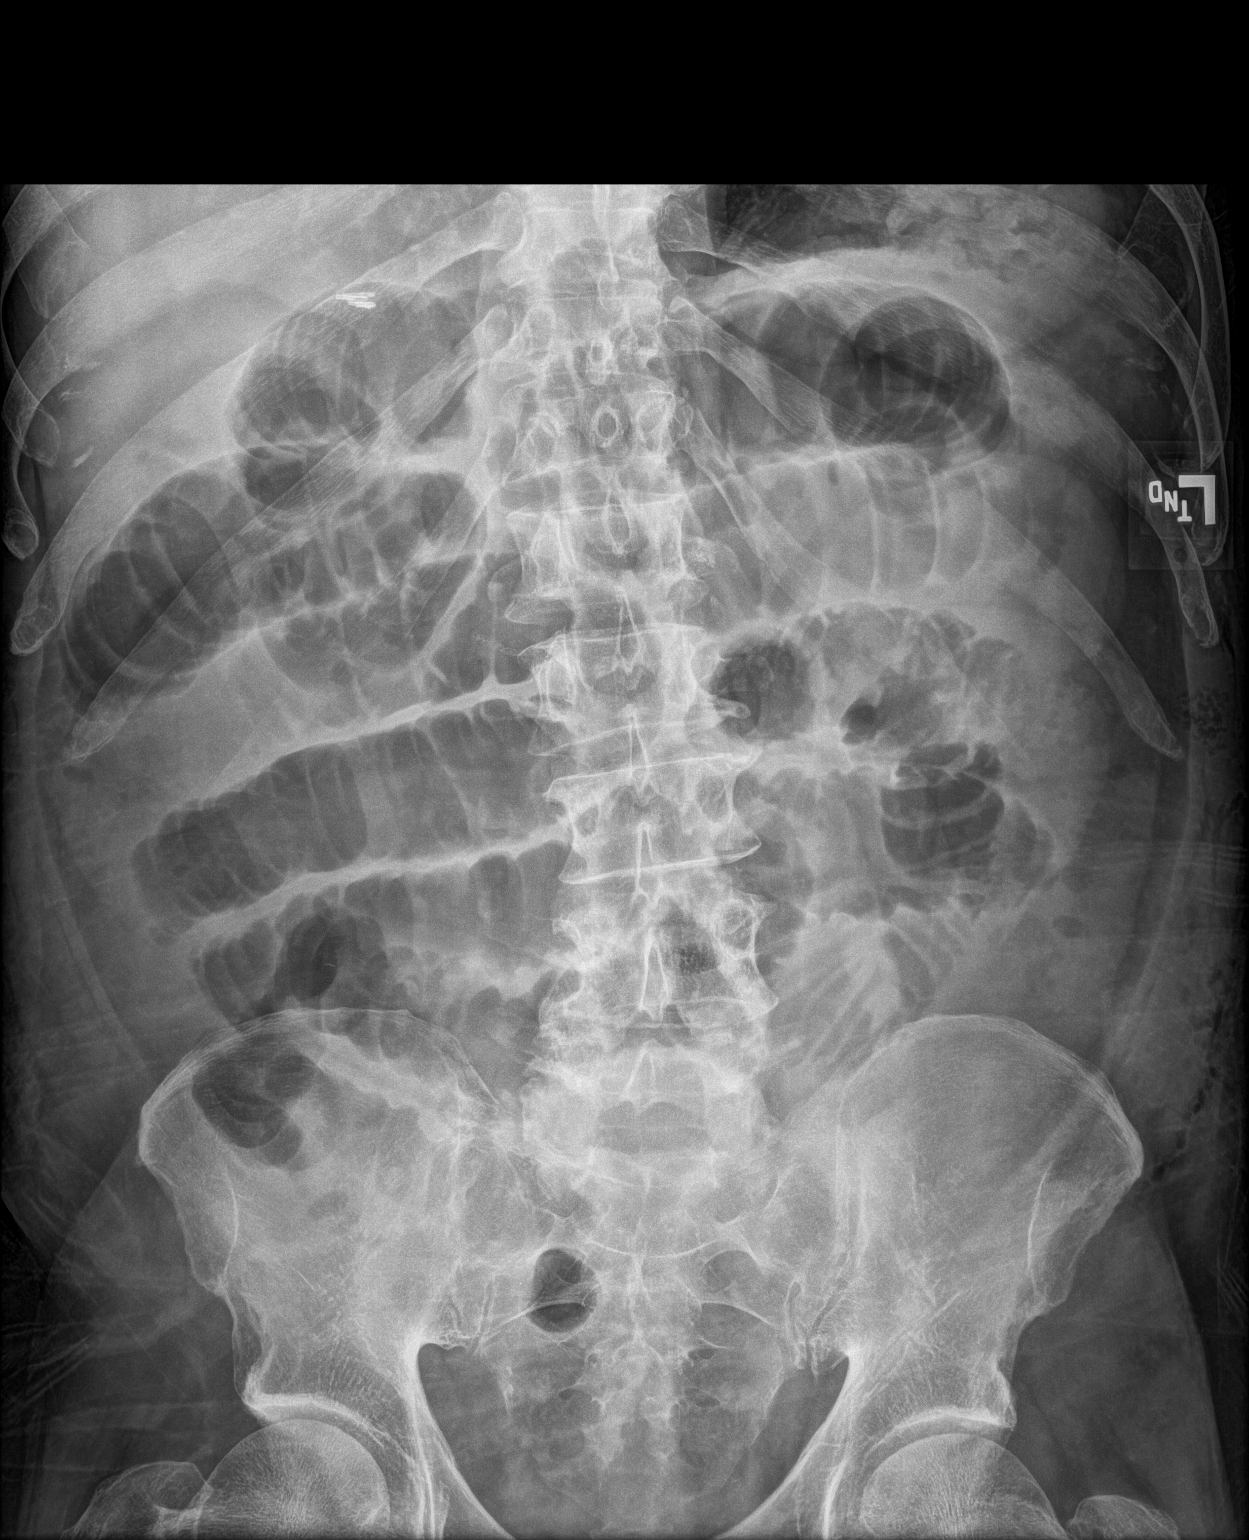

[2 of 2 positions shown; findings below may reference images not displayed]

FINDINGS: Nasogastric tube no longer evident. There remains dilatation of
multiple loops of small bowel. Colon appears decompressed. No free
air. Surgical clips noted in right upper abdomen. Visualized lung
bases are clear. Apparent central catheter tip in superior vena
cava, stable.
IMPRESSION: Nasogastric tube no longer evident. There remain multiple loops of
dilated small bowel consistent with either persistent ileus or a
degree of bowel obstruction. No free air evident. Clips noted right
upper quadrant.

## 2021-05-26 ENCOUNTER — Ambulatory Visit: Payer: 59 | Admitting: Family Medicine

## 2021-06-03 NOTE — Progress Notes (Signed)
06/16/21 ?8:34 AM  ? ?Tony Hernandez ?11-03-1955 ?[id number] ? ?Referring provider:  ?Pleas Koch, NP ?[address] ?[place],  [place] [address] ? ?Chief Complaint  ?Patient presents with  ? Erectile Dysfunction  ? ? ?Urological history  ?1. Prostate cancer ?- PSA <0.1 ng/mL in 12/2020 ?- history of elevated/fluctuating PSA as high as 12 in 2019.  Most recent PSA 7.5 on 04/07/2019 up from the previous year at which time it was 6.55 in 05/2018.  Personally discussed MRI with Dr. Jacalyn Lefevre regarding MRI finding.  Most recent MRI indicated two regions suspicious for prostate cancer at [facility] category 4 designation. No enlarged lymph nodes or metastasis outside of prostate noted. Nodular BPH enlarged. Obscured by post biopsy hemorrhage however no biopsy performed. MR size 64 g.   He had a fusion prostate bx on 07/27/19. His path report indicates 6 of 12 cores involved low volume Gleason 3+3, 4+3, 3+4 up to 10% of tissue bilaterally at apex lateral, apex, and mid. He has low risk prostate cancer on left side and intermediate risk unfavorable prostate cancer on right side.  Prostate volume 47 g.  ?- robot assisted laparoscopic right colectomy on 08/17/19 performed by Dr. Dahlia Byes. He also underwent laparoscopic radical prostatectomy and bilateral pelvic lymph node dissection ?- surgical pathology on 08/17/2019 showed Gleason 3+4, lowercase pT2, pN0, negative bladder neck, margins, and nodes. No extraprostatic extension ?  ?2. SUI ?- wearing depends  ?  ?3. ED ?- risk factors of age, prostate cancer, prostatectomy, former smoker, former drug use and hypothyroidism ?- failed PDE5i's  ?- Managed on 3 mcg Trimix (30/1/10) ?- He was prescribed sildenafil by Dr Erlene Quan on 12/24/2020 ?- SHIM 6  ? ?HPI: ?Tony Hernandez is a 66 y.o.male who presents today for further evaluation of erectile dysfunction. ? ?He reports that his Trimix has expired. He was told to up his dose by Dr Erlene Quan but he was unable to try this due to expiration. He reports  that when he used 3 mcg of Trimix it had not affect on his erections.  ? ?Patient denies any modifying or aggravating factors.  Patient denies any gross hematuria, dysuria or suprapubic/flank pain.  Patient denies any fevers, chills, nausea or vomiting.  ? ?Patient is not having spontaneous erections.  He denies any pain or curvature with erections.   ? ? SHIM   ? ? [place] Name 06/05/21 0919  ?  ?  ?  ? SHIM: Over the last 6 months:  ? How do you rate your confidence that you could get and keep an erection? Low    ? When you had erections with sexual stimulation, how often were your erections hard enough for penetration (entering your partner)? Almost Never or Never    ? During sexual intercourse, how often were you able to maintain your erection after you had penetrated (entered) your partner? Almost Never or Never    ? During sexual intercourse, how difficult was it to maintain your erection to completion of intercourse? Extremely Difficult    ? When you attempted sexual intercourse, how often was it satisfactory for you? Almost Never or Never    ?  ? SHIM Total Score  ? SHIM 6    ? ?  ?  ? ?  ? ? ? ?PMH: ?Past Medical History:  ?Diagnosis Date  ? Arthritis   ? knees  ? GERD (gastroesophageal reflux disease)   ? Thyroid disease   ? Wears dentures   ? ? ?  Surgical History: ?Past Surgical History:  ?Procedure Laterality Date  ? CHOLECYSTECTOMY  2018  ? COLONOSCOPY WITH PROPOFOL N/A 05/19/2019  ? Procedure: COLONOSCOPY WITH PROPOFOL;  Surgeon: [name], MD;  Location: [place];  Service: Endoscopy;  Laterality: N/A;  Priority 4  ? POLYPECTOMY  05/19/2019  ? Procedure: POLYPECTOMY;  Surgeon: [name], MD;  Location: [place];  Service: Endoscopy;;  ? ROBOT ASSISTED LAPAROSCOPIC RADICAL PROSTATECTOMY Bilateral 08/17/2019  ? Procedure: XI ROBOTIC ASSISTED LAPAROSCOPIC PROSTATECTOMY, BILATERAL LYMPH NODE DISSECTION;  Surgeon: Hollice Espy, MD;  Location: [facility];  Service: Urology;  Laterality:  Bilateral;  ? ? ?Home Medications:  ?Allergies as of 06/05/2021   ?No Known Allergies ?  ? ?  ?Medication List  ?  ? ?  ? Accurate as of June 05, 2021 11:59 PM. If you have any questions, ask your nurse or doctor.  ?  ?  ? ?  ? ?cyclobenzaprine 10 MG tablet ?Commonly known as: FLEXERIL ?Take 0.5-1 tablets (5-10 mg total) by mouth at bedtime as needed for muscle spasms. ?  ?diclofenac Sodium 1 % Gel ?Commonly known as: Voltaren ?Apply 2 g topically 3 (three) times daily as needed. ?  ?levothyroxine 150 MCG tablet ?Commonly known as: SYNTHROID ?TAKE 1 TABLET BY MOUTH EVERY MORNING ON AN EMPTY STOMACH WITH WATER ONLY. NO FOOD OR OTHER MEDICATIONS FOR 30 MINUTES. ?  ?NONFORMULARY OR COMPOUNDED ITEM ?Trimix (30/1/10)-(Pap/Phent/PGE) ? ?Dosage: Inject 0.3-0.7  cc per injection ? ?Vial 73m ? ?Qty #5 Refills 6 ? ?[facility] ?[phone]?Fax [phone]?What changed: additional instructions ?Changed by: SZara Council PA-C ?  ?predniSONE 20 MG tablet ?Commonly known as: DELTASONE ?2 tabs po daily for 5 days, then 1 tab po daily for 5 days ?  ?sildenafil 20 MG tablet ?Commonly known as: REVATIO ?Take 1-5 tablets (20-100 mg total) by mouth 3 (three) times daily. ?  ? ?  ? ? ?Allergies:  ?No Known Allergies ? ?Family History: ?Family History  ?Problem Relation Age of Onset  ? Lung cancer Mother 732 ? Diabetes Paternal Grandmother   ? Colon cancer Sister 560 ? ? ?Social History:  reports that he quit smoking about 20 years ago. His smoking use included cigarettes. He has never used smokeless tobacco. He reports that he does not currently use drugs after having used the following drugs: Cocaine and Marijuana. He reports that he does not drink alcohol. ? ? ?Physical Exam: ?BP 130/70   Pulse 76   Ht '5\' 5"'$  (1.651 m)   Wt 163 lb (73.9 kg)   BMI 27.12 kg/m?   ?Constitutional:  Well nourished. Alert and oriented, No acute distress. ?HEENT: [place] AT, mask in place  Trachea midline ?Cardiovascular: No clubbing, cyanosis,  or edema. ?Respiratory: Normal respiratory effort, no increased work of breathing. ?Neurologic: Grossly intact, no focal deficits, moving all 4 extremities. ?Psychiatric: Normal mood and affect.  ? ?Laboratory Data: ?Component ?    Latest Ref Rng & Units 09/11/2019 12/07/2019 06/18/2020 12/23/2020  ?Prostate Specific Ag, Serum ?    0.0 - 4.0 ng/mL <0.1 <0.1 <0.1 <0.1  ? ?Lab Results  ?Component Value Date  ? CREATININE 1.16 07/17/2020  ? ?Component ?    Latest Ref Rng & Units 10/02/2020  ?TSH ?    0.35 - 5.50 uIU/mL 2.76  ?I have reviewed the labs.  ? ?Pertinent Imaging ?N/A ? ?Assessment & Plan:   ? ?1. ED ?- Patient with a unsatisfactory erection with 3 mcg of  Trimix, we reviewed where to inject and how again today; refill of trimix sent today  ?- Will up his dose to 4 mcg of Trimix (30/1/10) ?- Discussed with him that he can continue to up dose by 0.1 cc to his desired erection. If this does not help symptoms, I discussed with him that we are able to change the potency of the Trimix.   ? ?2. Prostate cancer  ?-keep follow up appointment with Dr. Erlene Quan in April  ? ?Return for Keep appointment with Dr. Erlene Quan . ? ?[facility] ?[address], [address] ?[place], [place] [address] ?[phone] ? ?I,Rama Sorci,acting as a scribe for [employer], PA-C.,have documented all relevant documentation on the behalf of [place], PA-C,as directed by  [facility], PA-C while in the presence of [employer], PA-C. ? ?I have reviewed the above documentation for accuracy and completeness, and I agree with the above.   ? ?Zara Council, PA-C  ?

## 2021-06-04 ENCOUNTER — Encounter: Payer: Self-pay | Admitting: Family Medicine

## 2021-06-04 ENCOUNTER — Other Ambulatory Visit: Payer: Self-pay

## 2021-06-04 ENCOUNTER — Ambulatory Visit: Payer: 59 | Admitting: Family Medicine

## 2021-06-04 VITALS — BP 140/78 | HR 80 | Temp 98.3°F | Ht 65.0 in | Wt 165.4 lb

## 2021-06-04 DIAGNOSIS — M5416 Radiculopathy, lumbar region: Secondary | ICD-10-CM | POA: Diagnosis not present

## 2021-06-04 MED ORDER — CYCLOBENZAPRINE HCL 10 MG PO TABS: 5.0000 mg | ORAL_TABLET | Freq: Every evening | ORAL | 1 refills | Status: DC | PRN

## 2021-06-04 MED ORDER — PREDNISONE 20 MG PO TABS: ORAL_TABLET | ORAL | 0 refills | Status: DC

## 2021-06-04 NOTE — Progress Notes (Signed)
? ? ?Tony Hernandez T. Tony Viles, MD, Tony Hernandez ?Therapist, music at [facility] ?[name] ?[name], [address] ? ?Phone: [phone]  FAX: [phone] ? ?Tony Hernandez - 66 y.o. male  MRN [id number]  Date of Birth: Apr 21, 1955 ? ?Date: 06/04/2021  PCP: Tony Koch, NP  Referral: Tony Koch, NP ? ?Chief Complaint  ?Patient presents with  ? Back Pain  ?  Comes and go for about a month  ? ? ?This visit occurred during the SARS-CoV-2 public health emergency.  Safety protocols were in place, including screening questions prior to the visit, additional usage of staff PPE, and extensive cleaning of exam room while observing appropriate contact time as indicated for disinfecting solutions.  ? ?Subjective:  ? ?Tony Hernandez is a 66 y.o. very pleasant male patient who presents with the following: Back Pain ? ?ongoing for approximately:  1 mo ?The patient has had back pain before, but this is worse.  ?The back pain is localized into the lumbar spine area. They also describe some radiating pain down the R leg. ? 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  Traditionally this does not cause him any problem. ? ?He also travels quite a bit for work.  He is having some pain in the posterior hip as well as some occasional pain down the right leg. ?He also did have a history of having a significant leg fracture in July. ?No significant major back history. ? ?Additionally, he has noticed some numbness on the top of the foot. ? ?No tingling. No bowel or bladder incontinence. No focal weakness. ?Prior interventions: None ?Physical therapy: No ?Chiropractic manipulations: No ?Acupuncture: No ?Osteopathic manipulation: No ?Heat or cold: Minimal effect ? ? ?Review of Systems is noted in the HPI, as appropriate ? ?Objective:  ? ?Blood pressure 140/78, pulse 80, temperature 98.3 ?F (36.8 ?C), temperature source Skin, height '5\' 5"'$  (1.651 m), weight 165 lb 6 oz (75 kg),  SpO2 98 %. ? ?GEN: No acute distress; alert,appropriate. ?PULM: Breathing comfortably in no respiratory distress ?PSYCH: Normally interactive.  ? ?Range of motion at  the waist: Flexion, rotation and lateral bending: Grossly full ? ?No echymosis or edema ?Rises to examination table with no difficulty ?Gait: minimally antalgic ? ?Inspection/Deformity: No abnormality ?Paraspinus T: Right much greater than left, L3-S1 ? ?B Ankle Dorsiflexion (L5,4): 5/5 ?B Great Toe Dorsiflexion (L5,4): 5/5 ?Heel Walk (L5): WNL ?Toe Walk (S1): WNL ?Rise/Squat (L4): WNL, mild pain ? ?SENSORY ?B Medial Foot (L4): WNL ?B Dorsum (L5): Dorsum of the foot is decreased to soft as well as pinprick  ?B Lateral (S1): WNL ? ?REFLEXES ?Knee (L4): 2+ ?Ankle (S1): 2+ ? ?B SLR, seated: neg ?B SLR, supine: neg ?B FABER: neg ?B Reverse FABER: neg ?B Greater Troch: NT ?B Log Roll: neg ?B Stork: NT ?B Sciatic Notch: NT ? ?Radiology: ?No results found. ? ?Assessment and Plan:  ? ?  ICD-10-CM   ?1. Lumbar radiculopathy, acute  M54.16   ?  ? ?Back pain, with some right-sided radicular pain intermittently.  He also has some dorsum of the foot numbness.  Overall he is in some mild pain in general. ? ?Anatomy reviewed. Conservative algorithms for acute back pain generally begin with the following: NSAIDS, Muscle Relaxants, Mild pain medication, but with some radicular symptoms, I am also going to give the patient some oral steroids. ?Start with medications, core rehab, and progress from there following  low back pain algorithm. No red flags are present. ? ?Follow-up: No follow-ups on file. ? ?Meds ordered this encounter  ?Medications  ? predniSONE (DELTASONE) 20 MG tablet  ?  Sig: 2 tabs po daily for 5 days, then 1 tab po daily for 5 days  ?  Dispense:  15 tablet  ?  Refill:  0  ? cyclobenzaprine (FLEXERIL) 10 MG tablet  ?  Sig: Take 0.5-1 tablets (5-10 mg total) by mouth at bedtime as needed for muscle spasms.  ?  Dispense:  30 tablet  ?  Refill:  1  ? ?There  are no discontinued medications. ?No orders of the defined types were placed in this encounter. ? ? ?Signed, ? ?Jasreet Dickie T. Arayna Illescas, MD ? ? ?Outpatient Encounter Medications as of 06/04/2021  ?Medication Sig  ? cyclobenzaprine (FLEXERIL) 10 MG tablet Take 0.5-1 tablets (5-10 mg total) by mouth at bedtime as needed for muscle spasms.  ? diclofenac Sodium (VOLTAREN) 1 % GEL Apply 2 g topically 3 (three) times daily as needed.  ? levothyroxine (SYNTHROID) 150 MCG tablet TAKE 1 TABLET BY MOUTH EVERY MORNING ON AN EMPTY STOMACH WITH WATER ONLY. NO FOOD OR OTHER MEDICATIONS FOR 30 MINUTES.  ? predniSONE (DELTASONE) 20 MG tablet 2 tabs po daily for 5 days, then 1 tab po daily for 5 days  ? sildenafil (REVATIO) 20 MG tablet Take 1-5 tablets (20-100 mg total) by mouth 3 (three) times daily.  ? [DISCONTINUED] NONFORMULARY OR COMPOUNDED ITEM Trimix (30/1/10)-(Pap/Phent/PGE) ? ?Test Dose  47m vial  ? ?Qty #3 Refills 0 ? ?[facility] ?[phone]?Fax [phone] ? ?No facility-administered encounter medications on file as of 06/04/2021.  ?  ?

## 2021-06-05 ENCOUNTER — Ambulatory Visit (INDEPENDENT_AMBULATORY_CARE_PROVIDER_SITE_OTHER): Payer: 59 | Admitting: Urology

## 2021-06-05 ENCOUNTER — Encounter: Payer: Self-pay | Admitting: Family Medicine

## 2021-06-05 VITALS — BP 130/70 | HR 76 | Ht 65.0 in | Wt 163.0 lb

## 2021-06-05 DIAGNOSIS — N5231 Erectile dysfunction following radical prostatectomy: Secondary | ICD-10-CM | POA: Diagnosis not present

## 2021-06-05 DIAGNOSIS — C61 Malignant neoplasm of prostate: Secondary | ICD-10-CM | POA: Diagnosis not present

## 2021-06-05 MED ORDER — NONFORMULARY OR COMPOUNDED ITEM: 0 refills | Status: DC

## 2021-06-16 ENCOUNTER — Encounter: Payer: Self-pay | Admitting: Urology

## 2021-06-19 ENCOUNTER — Encounter: Payer: Self-pay | Admitting: Primary Care

## 2021-06-19 ENCOUNTER — Ambulatory Visit: Payer: 59 | Admitting: Primary Care

## 2021-06-19 ENCOUNTER — Ambulatory Visit (INDEPENDENT_AMBULATORY_CARE_PROVIDER_SITE_OTHER)
Admission: RE | Admit: 2021-06-19 | Discharge: 2021-06-19 | Disposition: A | Discharge status: Home / Self Care | Payer: 59 | Source: Ambulatory Visit | Attending: Primary Care | Admitting: Primary Care

## 2021-06-19 VITALS — BP 124/76 | HR 84 | Temp 98.6°F | Ht 65.0 in | Wt 165.0 lb

## 2021-06-19 DIAGNOSIS — Z8546 Personal history of malignant neoplasm of prostate: Secondary | ICD-10-CM | POA: Diagnosis not present

## 2021-06-19 DIAGNOSIS — R1084 Generalized abdominal pain: Secondary | ICD-10-CM | POA: Insufficient documentation

## 2021-06-19 DIAGNOSIS — R109 Unspecified abdominal pain: Secondary | ICD-10-CM

## 2021-06-19 HISTORY — DX: Unspecified abdominal pain: R10.9

## 2021-06-19 HISTORY — DX: Generalized abdominal pain: R10.84

## 2021-06-19 LAB — COMPREHENSIVE METABOLIC PANEL
ALT: 16 U/L (ref 0–53)
AST: 13 U/L (ref 0–37)
Albumin: 3.9 g/dL (ref 3.5–5.2)
Alkaline Phosphatase: 66 U/L (ref 39–117)
BUN: 9 mg/dL (ref 6–23)
CO2: 28 mEq/L (ref 19–32)
Calcium: 9.3 mg/dL (ref 8.4–10.5)
Chloride: 104 mEq/L (ref 96–112)
Creatinine, Ser: 0.98 mg/dL (ref 0.40–1.50)
GFR: 81.89 mL/min (ref >60.00)
Glucose, Bld: 103 mg/dL — ABNORMAL HIGH (ref 70–99)
Potassium: 4.4 mEq/L (ref 3.5–5.1)
Sodium: 139 mEq/L (ref 135–145)
Total Bilirubin: 0.2 mg/dL (ref 0.2–1.2)
Total Protein: 6.4 g/dL (ref 6.0–8.3)

## 2021-06-19 LAB — CBC WITH DIFFERENTIAL/PLATELET
Basophils Absolute: 0.1 10*3/uL (ref 0.0–0.1)
Basophils Relative: 1.3 % (ref 0.0–3.0)
Eosinophils Absolute: 0.6 10*3/uL (ref 0.0–0.7)
Eosinophils Relative: 6.4 % — ABNORMAL HIGH (ref 0.0–5.0)
HCT: 37.8 % — ABNORMAL LOW (ref 39.0–52.0)
Hemoglobin: 12.2 g/dL — ABNORMAL LOW (ref 13.0–17.0)
Lymphocytes Relative: 27.1 % (ref 12.0–46.0)
Lymphs Abs: 2.4 10*3/uL (ref 0.7–4.0)
MCHC: 32.1 g/dL (ref 30.0–36.0)
MCV: 76.9 fl — ABNORMAL LOW (ref 78.0–100.0)
Monocytes Absolute: 0.9 10*3/uL (ref 0.1–1.0)
Monocytes Relative: 9.8 % (ref 3.0–12.0)
Neutro Abs: 4.8 10*3/uL (ref 1.4–7.7)
Neutrophils Relative %: 55.4 % (ref 43.0–77.0)
Platelets: 359 10*3/uL (ref 150.0–400.0)
RBC: 4.92 Mil/uL (ref 4.22–5.81)
RDW: 14.1 % (ref 11.5–15.5)
WBC: 8.7 10*3/uL (ref 4.0–10.5)

## 2021-06-19 LAB — PSA: PSA: 0 ng/mL — ABNORMAL LOW (ref 0.10–4.00)

## 2021-06-19 NOTE — Patient Instructions (Signed)
Stop by the lab and xray prior to leaving today. I will notify you of your results once received.  ? ?Work to [employer].  Reduce fried/fatty food, junk food.  Increase fresh fruit and vegetables, lean protein, water. ? ?It was a pleasure to see you today! ? ? ?

## 2021-06-19 NOTE — Assessment & Plan Note (Signed)
Unclear etiology at this point.  ?His unhealthy diet is certainly not helping. ? ?Reviewed colonoscopy report from February 2021.  We will touch base with GI to see if repeat colonoscopy is still warranted given his hemicolectomy in June 2021. ? ?We will obtain abdominal plain films and labs today. ?Await results. ?

## 2021-06-19 NOTE — Assessment & Plan Note (Signed)
Following with urology, office notes from March 2023 reviewed. ? ?PSA pending today. ? ?We will touch base with urology regarding CT renal results from June 2021. ?

## 2021-06-19 NOTE — Progress Notes (Signed)
? ?Subjective:  ? ? Patient ID: Tony Hernandez, male    DOB: 09-29-1955, 65 y.o.   MRN: [id number] ? ?HPI ? ?Tony Hernandez is a very pleasant 66 y.o. male with a history of GERD, hypothyroidism, prostatectomy, cholecystectomy, right colectomy who presents today to discuss abdominal pain.  ? ?Symptoms began 2-3 months ago. His pain is located to the generalized abdomen for which he describes as intermittent and sharp. 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 He mostly notices his pain while traveling on the road or the day after coming home from traveling.  ? ?Last week he noticed his pain which began the day after he came home from a 3-4 day trip for work. His pain was intermittent for 2-3 days. He endorsed normal, daily bowel movements during this time. He took [employer] without much improvement, otherwise "I fought through through the pain".  ? ?Typically bowel movements are daily. He denies unexplained weight loss, bloody stools, fevers, esophageal reflux, abdominal bloating, decreased appetite. He underwent colonoscopy in 2021, several large polyps resected, recommendation was to return 5 months later for repeat colonoscopy. He underwent right hemicolectomy and radical prostatectomy and bilateral lymph node dissection in June 2021.  CT abdomen/pelvis from June 2021 revealed intermediate density lesion to right upper pole of the kidney, follow-up CT study was recommended to exclude renal neoplasm.  No repeat imaging has been completed. ? ?He follows with Urology, has an appointment coming up in April 2023.  ? ?BP Readings from Last 3 Encounters:  ?06/19/21 124/76  ?06/05/21 130/70  ?06/04/21 140/78  ? ? ? ? ? ? ?Review of Systems  ?Constitutional:  Negative for fever.  ?Gastrointestinal:  Positive for abdominal pain. Negative for blood in stool, constipation, diarrhea, nausea, rectal pain and vomiting.  ?Genitourinary:  Negative for  difficulty urinating.  ? ?   ? ? ?Past Medical History:  ?Diagnosis Date  ? Arthritis   ? knees  ? GERD (gastroesophageal reflux disease)   ? Thyroid disease   ? Wears dentures   ? ? ?Social History  ? ?Socioeconomic History  ? Marital status: Single  ?  Spouse name: Not on file  ? Number of children: Not on file  ? Years of education: Not on file  ? Highest education level: Not on file  ?Occupational History  ? Not on file  ?Tobacco Use  ? Smoking status: Former  ?  Years: 10.00  ?  Types: Cigarettes  ?  Quit date: 06/21/2001  ?  Years since quitting: 20.0  ? Smokeless tobacco: Never  ?Vaping Use  ? Vaping Use: Never used  ?Substance and Sexual Activity  ? Alcohol use: No  ?  Alcohol/week: 0.0 standard drinks  ?  Comment: quit 18 years ago  ? Drug use: Not Currently  ?  Types: Cocaine, Marijuana  ?  Comment: quit 18 years ago   ? Sexual activity: Yes  ?  Birth control/protection: None  ?Other Topics Concern  ? Not on file  ?Social History Narrative  ? Single.  ? 2 children.  ? Works as a Administrator.  ? Enjoys spending time with family.  ? ?Social Determinants of Health  ? ?Financial Resource Strain: Not on file  ?Food Insecurity: Not on file  ?Transportation Needs: Not on file  ?Physical Activity: Not on file  ?Stress: Not on file  ?Social Connections: Not on file  ?Intimate  Partner Violence: Not on file  ? ? ?Past Surgical History:  ?Procedure Laterality Date  ? CHOLECYSTECTOMY  2018  ? COLONOSCOPY WITH PROPOFOL N/A 05/19/2019  ? Procedure: COLONOSCOPY WITH PROPOFOL;  Surgeon: [name], MD;  Location: [facility];  Service: Endoscopy;  Laterality: N/A;  Priority 4  ? POLYPECTOMY  05/19/2019  ? Procedure: POLYPECTOMY;  Surgeon: [name], MD;  Location: [facility];  Service: Endoscopy;;  ? ROBOT ASSISTED LAPAROSCOPIC RADICAL PROSTATECTOMY Bilateral 08/17/2019  ? Procedure: XI ROBOTIC ASSISTED LAPAROSCOPIC PROSTATECTOMY, BILATERAL LYMPH NODE DISSECTION;  Surgeon: Hollice Espy, MD;  Location:  [facility];  Service: Urology;  Laterality: Bilateral;  ? ? ?Family History  ?Problem Relation Age of Onset  ? Lung cancer Mother 71  ? Diabetes Paternal Grandmother   ? Colon cancer Sister 28  ? ? ?No Known Allergies ? ?Current Outpatient Medications on File Prior to Visit  ?Medication Sig Dispense Refill  ? cyclobenzaprine (FLEXERIL) 10 MG tablet Take 0.5-1 tablets (5-10 mg total) by mouth at bedtime as needed for muscle spasms. 30 tablet 1  ? diclofenac Sodium (VOLTAREN) 1 % GEL Apply 2 g topically 3 (three) times daily as needed. 100 g 1  ? levothyroxine (SYNTHROID) 150 MCG tablet TAKE 1 TABLET BY MOUTH EVERY MORNING ON AN EMPTY STOMACH WITH WATER ONLY. NO FOOD OR OTHER MEDICATIONS FOR 30 MINUTES. 90 tablet 2  ? NONFORMULARY OR COMPOUNDED ITEM Trimix (30/1/10)-(Pap/Phent/PGE) ? ?Dosage: Inject 0.3-0.7  cc per injection ? ?Vial 54m ? ?Qty #5 Refills 6 ? ?[facility] ?[phone]?Fax [phone] each 0  ? predniSONE (DELTASONE) 20 MG tablet 2 tabs po daily for 5 days, then 1 tab po daily for 5 days 15 tablet 0  ? sildenafil (REVATIO) 20 MG tablet Take 1-5 tablets (20-100 mg total) by mouth 3 (three) times daily. 30 tablet 3  ? ?No current facility-administered medications on file prior to visit.  ? ? ?BP 124/76   Pulse 84   Temp 98.6 ?F (37 ?C) (Oral)   Ht '5\' 5"'$  (1.651 m)   Wt 165 lb (74.8 kg)   SpO2 97%   BMI 27.46 kg/m?  ?Objective:  ? Physical Exam ?Cardiovascular:  ?   Rate and Rhythm: Normal rate and regular rhythm.  ?Pulmonary:  ?   Effort: Pulmonary effort is normal.  ?   Breath sounds: Normal breath sounds. No wheezing or rales.  ?Abdominal:  ?   General: Bowel sounds are normal. There is no distension.  ?   Palpations: Abdomen is soft.  ?   Tenderness: There is no abdominal tenderness.  ?Musculoskeletal:  ?   Cervical back: Neck supple.  ?Skin: ?   General: Skin is warm and dry.  ?Neurological:  ?   Mental Status: He is alert and oriented to person, place, and time.  ? ? ? ? ? ?    ?Assessment & Plan:  ? ? ? ? ?This visit occurred during the SARS-CoV-2 public health emergency.  Safety protocols were in place, including screening questions prior to the visit, additional usage of staff PPE, and extensive cleaning of exam room while observing appropriate contact time as indicated for disinfecting solutions.  ?

## 2021-06-20 ENCOUNTER — Other Ambulatory Visit (INDEPENDENT_AMBULATORY_CARE_PROVIDER_SITE_OTHER): Payer: 59

## 2021-06-20 DIAGNOSIS — D649 Anemia, unspecified: Secondary | ICD-10-CM

## 2021-06-20 LAB — IBC + FERRITIN
Ferritin: 131.6 ng/mL (ref 22.0–322.0)
Iron: 22 ug/dL — ABNORMAL LOW (ref 42–165)
Saturation Ratios: 6.4 % — ABNORMAL LOW (ref 20.0–50.0)
TIBC: 341.6 ug/dL (ref 250.0–450.0)
Transferrin: 244 mg/dL (ref 212.0–360.0)

## 2021-06-24 ENCOUNTER — Telehealth: Payer: Self-pay | Admitting: Primary Care

## 2021-06-24 ENCOUNTER — Telehealth: Payer: Self-pay

## 2021-06-24 ENCOUNTER — Other Ambulatory Visit: Payer: 59

## 2021-06-24 DIAGNOSIS — N2889 Other specified disorders of kidney and ureter: Secondary | ICD-10-CM

## 2021-06-24 DIAGNOSIS — N289 Disorder of kidney and ureter, unspecified: Secondary | ICD-10-CM

## 2021-06-24 NOTE — Telephone Encounter (Signed)
Please notify patient that I spoke with his urologist regarding the spot on the kidney that was noted on CT scan from 2021.  We discussed during his visit. ? ?We need to repeat the CT scan for further evaluation of his kidney.  Is he willing to undergo repeat CT scan? ?

## 2021-06-24 NOTE — Telephone Encounter (Signed)
Spoke with pt regarding Ct scan pt is will to have  this done. Pt is working out of town driving tuck, pt will be in town on 07/15/21. He said if can scheduled during this time. ?

## 2021-06-24 NOTE — Telephone Encounter (Signed)
Called patient and could not leave him a voicemail. I will send him a letter to call us back so we could schedule him a colonoscopy since he is over due. ?

## 2021-06-24 NOTE — Telephone Encounter (Signed)
Noted, CT scan ordered.  ?

## 2021-06-24 NOTE — Telephone Encounter (Signed)
-----   Message from [name], MD sent at 06/19/2021  1:24 PM EDT ----- ?Regarding: RE: Follow Up Colonoscopy ?Sariyah Corcino ? ?Please schedule patient for colonoscopy - overdue- if agrrees April 7th is available  ? ? ?----- Message ----- ?From: Pleas Koch, NP ?Sent: 06/19/2021   1:22 PM EDT ?To: [name], MD ?Subject: Follow Up Colonoscopy                         ? ?Hi Dr. Vicente Males, ? ?I saw this patient today for chronic abdominal pain, and in review of his colonoscopy it appears that he is overdue.  He removed several large polyps in February 2021 and recommended he follow back up 5 months later.  He does not recall being told to follow-up after 5 months.  He did undergo right hemicolectomy in June 2021. ? ?Should I refer him back to you for repeat colonoscopy? ? ?Thanks! ?Allie Bossier, NP-C ?[facility] ? ? ?

## 2021-06-25 ENCOUNTER — Encounter: Payer: Self-pay | Admitting: Urology

## 2021-07-18 ENCOUNTER — Encounter: Payer: 59 | Admitting: Primary Care

## 2021-07-23 ENCOUNTER — Other Ambulatory Visit: Payer: 59

## 2021-07-29 ENCOUNTER — Other Ambulatory Visit: Payer: 59

## 2021-08-05 ENCOUNTER — Encounter: Payer: 59 | Admitting: Primary Care

## 2021-08-29 ENCOUNTER — Other Ambulatory Visit: Payer: Self-pay | Admitting: Primary Care

## 2021-08-29 DIAGNOSIS — E039 Hypothyroidism, unspecified: Secondary | ICD-10-CM

## 2021-08-29 NOTE — Telephone Encounter (Signed)
Patient is due for follow up, this will be required prior to any further refills.  Please schedule.   

## 2021-09-02 NOTE — Telephone Encounter (Signed)
Called patient informed no further refills until seen. Set up follow up with Anda Kraft on 6/27 for CPE.

## 2021-09-03 ENCOUNTER — Ambulatory Visit (INDEPENDENT_AMBULATORY_CARE_PROVIDER_SITE_OTHER)
Admission: RE | Admit: 2021-09-03 | Discharge: 2021-09-03 | Disposition: A | Discharge status: Home / Self Care | Payer: 59 | Source: Ambulatory Visit | Attending: Nurse Practitioner | Admitting: Nurse Practitioner

## 2021-09-03 ENCOUNTER — Other Ambulatory Visit: Payer: Self-pay

## 2021-09-03 ENCOUNTER — Ambulatory Visit: Payer: 59 | Admitting: Nurse Practitioner

## 2021-09-03 ENCOUNTER — Telehealth: Payer: Self-pay | Admitting: Nurse Practitioner

## 2021-09-03 VITALS — BP 130/82 | HR 72 | Temp 97.3°F | Resp 14 | Ht 65.0 in | Wt 160.2 lb

## 2021-09-03 DIAGNOSIS — E039 Hypothyroidism, unspecified: Secondary | ICD-10-CM

## 2021-09-03 DIAGNOSIS — R1084 Generalized abdominal pain: Secondary | ICD-10-CM

## 2021-09-03 DIAGNOSIS — R3129 Other microscopic hematuria: Secondary | ICD-10-CM | POA: Insufficient documentation

## 2021-09-03 HISTORY — DX: Other microscopic hematuria: R31.29

## 2021-09-03 LAB — CBC
HCT: 40.7 % (ref 39.0–52.0)
Hemoglobin: 12.8 g/dL — ABNORMAL LOW (ref 13.0–17.0)
MCHC: 31.4 g/dL (ref 30.0–36.0)
MCV: 78.7 fl (ref 78.0–100.0)
Platelets: 303 10*3/uL (ref 150.0–400.0)
RBC: 5.17 Mil/uL (ref 4.22–5.81)
RDW: 15.7 % — ABNORMAL HIGH (ref 11.5–15.5)
WBC: 7.4 10*3/uL (ref 4.0–10.5)

## 2021-09-03 LAB — POCT URINALYSIS DIPSTICK
Bilirubin, UA: NEGATIVE
Glucose, UA: NEGATIVE
Ketones, UA: NEGATIVE
Leukocytes, UA: NEGATIVE
Nitrite, UA: NEGATIVE
Protein, UA: POSITIVE — AB
Spec Grav, UA: 1.025 (ref 1.010–1.025)
Urobilinogen, UA: 0.2 E.U./dL
pH, UA: 5.5 (ref 5.0–8.0)

## 2021-09-03 LAB — URINALYSIS, MICROSCOPIC ONLY

## 2021-09-03 LAB — COMPREHENSIVE METABOLIC PANEL
ALT: 16 U/L (ref 0–53)
AST: 14 U/L (ref 0–37)
Albumin: 4 g/dL (ref 3.5–5.2)
Alkaline Phosphatase: 73 U/L (ref 39–117)
BUN: 11 mg/dL (ref 6–23)
CO2: 29 mEq/L (ref 19–32)
Calcium: 9.7 mg/dL (ref 8.4–10.5)
Chloride: 105 mEq/L (ref 96–112)
Creatinine, Ser: 1.17 mg/dL (ref 0.40–1.50)
GFR: 66.11 mL/min (ref >60.00)
Glucose, Bld: 85 mg/dL (ref 70–99)
Potassium: 4.6 mEq/L (ref 3.5–5.1)
Sodium: 139 mEq/L (ref 135–145)
Total Bilirubin: 0.2 mg/dL (ref 0.2–1.2)
Total Protein: 6.9 g/dL (ref 6.0–8.3)

## 2021-09-03 LAB — H. PYLORI ANTIBODY, IGG: H Pylori IgG: NEGATIVE

## 2021-09-03 LAB — LIPASE: Lipase: 28 U/L (ref 11.0–59.0)

## 2021-09-03 MED ORDER — LEVOTHYROXINE SODIUM 150 MCG PO TABS: ORAL_TABLET | ORAL | 0 refills | Status: DC

## 2021-09-03 MED ORDER — OMEPRAZOLE 20 MG PO CPDR: 20.0000 mg | DELAYED_RELEASE_CAPSULE | Freq: Every day | ORAL | 0 refills | Status: DC

## 2021-09-03 NOTE — Telephone Encounter (Signed)
Ok thanks 

## 2021-09-03 NOTE — Telephone Encounter (Signed)
error 

## 2021-09-03 NOTE — Telephone Encounter (Signed)
Dr. Vicente Males,  Saw mutual patient in office and reviewed his last colonoscopy that you did that request a 5 month follow up. He mentioned that he was unaware. I did not know if you could get your staff to reach out to patient and get him scheduled up   Thanks, Tish Frederickson, NP

## 2021-09-03 NOTE — Progress Notes (Signed)
Acute Office Visit  Subjective:     Patient ID: Tony Hernandez, male    DOB: 01-17-1956, 66 y.o.   MRN: [id number]  Chief Complaint  Patient presents with   Abdominal Pain    X 2 weeks, not sure if he ate something to trigger this. He was coming from [place]. Pain is off and on, pain located in the middle of the stomach. No nausea, vomiting or diarrhea. Feels more bloated, belching.      Patient is in today for Abdominal pian  Stats that he noticed it approx 2 weeks ago. He noticed that he has been having abdominal pain that is intermittent and described as cramping. States that his bowels moved this morning and was normal. States that he has not had h pylori. Does not matter if he eats. Was seen by UC and was given Nexium that has helped some. Not currently on PPI therapy. Does not use NSAID medications on a regular basis  Last colonoscopy was performed on 05/19/2019.  Colonoscopy report patient needed to follow-up in 5 months has been 2 years.  We will reach out to GI group to facilitate patient's follow-up.  Patient did have a CT abdomen pelvis with and without contrast ordered by primary care provider patient states he was unable to get it due to his schedule this seems to be follow-up on a renal cyst found in 2021  Review of Systems  Constitutional:  Negative for chills and fever.  Gastrointestinal:  Positive for abdominal pain. Negative for constipation, diarrhea, melena, nausea and vomiting.        Objective:    BP 130/82   Pulse 72   Temp (!) 97.3 F (36.3 C)   Resp 14   Ht '5\' 5"'$  (1.651 m)   Wt 160 lb 4 oz (72.7 kg)   SpO2 98%   BMI 26.67 kg/m    Physical Exam Vitals and nursing note reviewed.  Constitutional:      Appearance: He is well-developed.  Cardiovascular:     Rate and Rhythm: Normal rate and regular rhythm.     Heart sounds: Normal heart sounds.  Pulmonary:     Effort: Pulmonary effort is normal.     Breath sounds: Normal breath sounds.   Abdominal:     General: Bowel sounds are normal. There is no distension.     Palpations: There is no mass.     Tenderness: There is abdominal tenderness.     Hernia: No hernia is present.    Neurological:     Mental Status: He is alert.     Results for orders placed or performed in visit on 09/03/21  POCT urinalysis dipstick  Result Value Ref Range   Color, UA yellow    Clarity, UA clear    Glucose, UA Negative Negative   Bilirubin, UA negative    Ketones, UA negative    Spec Grav, UA 1.025 1.010 - 1.025   Blood, UA small    pH, UA 5.5 5.0 - 8.0   Protein, UA Positive (A) Negative   Urobilinogen, UA 0.2 0.2 or 1.0 E.U./dL   Nitrite, UA negative    Leukocytes, UA Negative Negative   Appearance     Odor          Assessment & Plan:   Problem List Items Addressed This Visit       Genitourinary   Microscopic hematuria    Incidental finding on urinalysis.  Patient is a former smoker  we will send off for microscopy to verify whether there is blood or not.  Pending result      Relevant Orders   Urine Microscopic     Other   Generalized abdominal pain - Primary    Ambiguous at this point.  He was placed on a PPI which did help.  We will restart PPI he will take in the evening is since he is on thyroid medication in the morning.  Will obtain lab work and plain films today.  Did reach out to GI as he had a colonoscopy in 2021 with large polyps and wanted to repeat in 5 months.  Patient did go a partial colectomy since then.  GI wanted to see him back in 5 to 6 months post office visit of discussing results.  Sent message to Dr. Vicente Males. Pending results on plain film and labs today.  Start omeprazole 20 mg daily      Relevant Medications   omeprazole (PRILOSEC) 20 MG capsule   Other Relevant Orders   CBC   Lipase   Comprehensive metabolic panel   H. pylori antibody, IgG   DG Abd 2 Views   POCT urinalysis dipstick (Completed)    Meds ordered this encounter   Medications   omeprazole (PRILOSEC) 20 MG capsule    Sig: Take 1 capsule (20 mg total) by mouth daily.    Dispense:  30 capsule    Refill:  0    Order Specific Question:   Supervising Provider    Answer:   TOWER, MARNE A [[id number]]    Return if symptoms worsen or fail to improve.  Romilda Garret, NP

## 2021-09-03 NOTE — Telephone Encounter (Signed)
Patient stated today that his insurance will not cover his medication at [facility], RX re sent to [facility] instead per patient's request

## 2021-09-03 NOTE — Patient Instructions (Signed)
Nice to see you today I will be in touch with the labs and xray results once I have them We will start omeprazole. Take it at night I will be in touch about the colonoscopy part Follow up if no improvement

## 2021-09-03 NOTE — Telephone Encounter (Signed)
Called patient and it only rang. I was not able to leave him a voicemail. I will send him a MyChart message and a letter letting him know that he needs to contact us. In reference to your question, I reached to him in April via phone call and letter when you had sent me a message about rescheduling his colonoscopy. However, he never responded back to me.

## 2021-09-03 NOTE — Assessment & Plan Note (Signed)
Incidental finding on urinalysis.  Patient is a former smoker we will send off for microscopy to verify whether there is blood or not.  Pending result

## 2021-09-03 NOTE — Assessment & Plan Note (Signed)
Ambiguous at this point.  He was placed on a PPI which did help.  We will restart PPI he will take in the evening is since he is on thyroid medication in the morning.  Will obtain lab work and plain films today.  Did reach out to GI as he had a colonoscopy in 2021 with large polyps and wanted to repeat in 5 months.  Patient did go a partial colectomy since then.  GI wanted to see him back in 5 to 6 months post office visit of discussing results.  Sent message to Dr. Vicente Males. Pending results on plain film and labs today.  Start omeprazole 20 mg daily

## 2021-09-16 ENCOUNTER — Ambulatory Visit: Payer: 59 | Admitting: Primary Care

## 2021-10-08 ENCOUNTER — Ambulatory Visit (INDEPENDENT_AMBULATORY_CARE_PROVIDER_SITE_OTHER): Payer: 59 | Admitting: Primary Care

## 2021-10-08 ENCOUNTER — Encounter: Payer: Self-pay | Admitting: Primary Care

## 2021-10-08 VITALS — BP 128/74 | HR 76 | Temp 97.0°F | Ht 65.0 in | Wt 163.0 lb

## 2021-10-08 DIAGNOSIS — Z8546 Personal history of malignant neoplasm of prostate: Secondary | ICD-10-CM

## 2021-10-08 DIAGNOSIS — E039 Hypothyroidism, unspecified: Secondary | ICD-10-CM | POA: Diagnosis not present

## 2021-10-08 DIAGNOSIS — Z1211 Encounter for screening for malignant neoplasm of colon: Secondary | ICD-10-CM

## 2021-10-08 DIAGNOSIS — K219 Gastro-esophageal reflux disease without esophagitis: Secondary | ICD-10-CM | POA: Diagnosis not present

## 2021-10-08 DIAGNOSIS — Z0000 Encounter for general adult medical examination without abnormal findings: Secondary | ICD-10-CM | POA: Diagnosis not present

## 2021-10-08 DIAGNOSIS — R3129 Other microscopic hematuria: Secondary | ICD-10-CM

## 2021-10-08 DIAGNOSIS — Z9049 Acquired absence of other specified parts of digestive tract: Secondary | ICD-10-CM

## 2021-10-08 LAB — LIPID PANEL
Cholesterol: 182 mg/dL (ref 0–200)
HDL: 58 mg/dL (ref >39.00)
LDL Cholesterol: 110 mg/dL — ABNORMAL HIGH (ref 0–99)
NonHDL: 123.72
Total CHOL/HDL Ratio: 3
Triglycerides: 69 mg/dL (ref 0.0–149.0)
VLDL: 13.8 mg/dL (ref 0.0–40.0)

## 2021-10-08 LAB — TSH: TSH: 29.54 u[IU]/mL — ABNORMAL HIGH (ref 0.35–5.50)

## 2021-10-08 NOTE — Progress Notes (Signed)
Subjective:    Patient ID: Tony Hernandez, male    DOB: 1955-07-30, 66 y.o.   MRN: [id number]  HPI  Tony Hernandez is a very pleasant 66 y.o. male who presents today for complete physical and follow up of chronic conditions.  Immunizations: -Tetanus: 2021 -Influenza: Did not complete last season  -Covid-19: 4 vaccines  -Shingles: Completed Shingrix  Diet: Fair diet.  Exercise: No regular exercise. Active at work.   Eye exam: Completed several years ago Dental exam: Completed several years ago.  Colonoscopy: Completed in 2021, due in late August 2021. Never completed.   PSA: UTD, follows with Urology for history of prostate cancer  BP Readings from Last 3 Encounters:  10/08/21 128/74  09/03/21 130/82  06/19/21 124/76        Review of Systems  Constitutional:  Negative for unexpected weight change.  HENT:  Negative for rhinorrhea.   Respiratory:  Negative for cough and shortness of breath.   Cardiovascular:  Negative for chest pain.  Gastrointestinal:  Negative for constipation and diarrhea.  Genitourinary:  Negative for difficulty urinating.  Musculoskeletal:  Negative for arthralgias and myalgias.  Skin:  Negative for rash.  Allergic/Immunologic: Negative for environmental allergies.  Neurological:  Negative for dizziness and headaches.  Psychiatric/Behavioral:  The patient is not nervous/anxious.          Past Medical History:  Diagnosis Date   Arthritis    knees   GERD (gastroesophageal reflux disease)    Thyroid disease    Wears dentures     Social History   Socioeconomic History   Marital status: Single    Spouse name: Not on file   Number of children: Not on file   Years of education: Not on file   Highest education level: Not on file  Occupational History   Not on file  Tobacco Use   Smoking status: Former    Years: 10.00    Types: Cigarettes    Quit date: 06/21/2001    Years since quitting: 20.3   Smokeless tobacco: Never  Vaping Use    Vaping Use: Never used  Substance and Sexual Activity   Alcohol use: No    Alcohol/week: 0.0 standard drinks of alcohol    Comment: quit 18 years ago   Drug use: Not Currently    Types: Cocaine, Marijuana    Comment: quit 18 years ago    Sexual activity: Yes    Birth control/protection: None  Other Topics Concern   Not on file  Social History Narrative   Single.   2 children.   Works as a Administrator.   Enjoys spending time with family.   Social Determinants of Health   Financial Resource Strain: Not on file  Food Insecurity: Not on file  Transportation Needs: Not on file  Physical Activity: Not on file  Stress: Not on file  Social Connections: Not on file  Intimate Partner Violence: Not on file    Past Surgical History:  Procedure Laterality Date   CHOLECYSTECTOMY  2018   COLONOSCOPY WITH PROPOFOL N/A 05/19/2019   Procedure: COLONOSCOPY WITH PROPOFOL;  Surgeon: [name], MD;  Location: [facility];  Service: Endoscopy;  Laterality: N/A;  Priority 4   POLYPECTOMY  05/19/2019   Procedure: POLYPECTOMY;  Surgeon: [name], MD;  Location: [facility];  Service: Endoscopy;;   ROBOT ASSISTED LAPAROSCOPIC RADICAL PROSTATECTOMY Bilateral 08/17/2019   Procedure: XI ROBOTIC ASSISTED LAPAROSCOPIC PROSTATECTOMY, BILATERAL LYMPH NODE DISSECTION;  Surgeon: Erlene Quan,  Caryl Pina, MD;  Location: [facility];  Service: Urology;  Laterality: Bilateral;    Family History  Problem Relation Age of Onset   Lung cancer Mother 31   Diabetes Paternal Grandmother    Colon cancer Sister 55    No Known Allergies  Current Outpatient Medications on File Prior to Visit  Medication Sig Dispense Refill   levothyroxine (SYNTHROID) 150 MCG tablet TAKE 1 TAB EVERY MORNING ON AN EMPTY STOMACH WITH WATER ONLY. NO FOOD OR OTHER MEDS FOR 30 MINS. Office visit required for further refills. 30 tablet 0   NONFORMULARY OR COMPOUNDED ITEM Trimix (30/1/10)-(Pap/Phent/PGE)  Dosage: Inject 0.3-0.7   cc per injection  Vial 43m  Qty #5 Refills 6  [phone] [phone] each 0   omeprazole (PRILOSEC) 20 MG capsule Take 1 capsule (20 mg total) by mouth daily. 30 capsule 0   sildenafil (REVATIO) 20 MG tablet Take 1-5 tablets (20-100 mg total) by mouth 3 (three) times daily. 30 tablet 3   No current facility-administered medications on file prior to visit.    BP 128/74   Pulse 76   Temp (!) 97 F (36.1 C) (Oral)   Ht '5\' 5"'$  (1.651 m)   Wt 163 lb (73.9 kg)   SpO2 94%   BMI 27.12 kg/m  Objective:   Physical Exam HENT:     Right Ear: Tympanic membrane and ear canal normal.     Left Ear: Tympanic membrane and ear canal normal.     Nose: Nose normal.     Right Sinus: No maxillary sinus tenderness or frontal sinus tenderness.     Left Sinus: No maxillary sinus tenderness or frontal sinus tenderness.  Eyes:     Conjunctiva/sclera: Conjunctivae normal.  Neck:     Thyroid: No thyromegaly.     Vascular: No carotid bruit.  Cardiovascular:     Rate and Rhythm: Normal rate and regular rhythm.     Heart sounds: Normal heart sounds.  Pulmonary:     Effort: Pulmonary effort is normal.     Breath sounds: Normal breath sounds. No wheezing or rales.  Abdominal:     General: Bowel sounds are normal.     Palpations: Abdomen is soft.     Tenderness: There is no abdominal tenderness.  Musculoskeletal:        General: Normal range of motion.     Cervical back: Neck supple.  Skin:    General: Skin is warm and dry.  Neurological:     Mental Status: He is alert and oriented to person, place, and time.     Cranial Nerves: No cranial nerve deficit.     Deep Tendon Reflexes: Reflexes are normal and symmetric.  Psychiatric:        Mood and Affect: Mood normal.           Assessment & Plan:   Problem List Items Addressed This Visit       Digestive   GERD (gastroesophageal reflux disease)    Controlled.  Continue omeprazole 20 mg PRN. He is  watching trigger foods.        Endocrine   Hypothyroidism    He is taking levothyroxine correctly.   Continue levothyroxine 150 mcg daily. Repeat TSH pending.       Relevant Orders   TSH   Lipid panel     Genitourinary   Microscopic hematuria    Urine microscopic from June 2023 negative.        Other  Preventative health care - Primary    Immunizations UTD. PSA UTD. Colonoscopy overdue, discussed with patient today. Referral placed to GI.  Discussed the importance of a healthy diet and regular exercise in order for weight loss, and to reduce the risk of further co-morbidity.  Exam stable. Labs pending.  Follow up in 1 year for repeat physical.       S/P right colectomy    Due for repeat colonoscopy, discussed this with patient today. GI has reached out several times to patient without success.   Discussed that he needs to call to schedule and/or answer when they call.      History of prostate cancer    Recent PSA level reviewed from Urology. Office notes from March 2023 reviewed per Urology S/P prostatectomy in 2021.        Other Visit Diagnoses     Screening for colon cancer       Relevant Orders   Ambulatory referral to Gastroenterology          Pleas Koch, NP

## 2021-10-08 NOTE — Assessment & Plan Note (Signed)
Due for repeat colonoscopy, discussed this with patient today. GI has reached out several times to patient without success.   Discussed that he needs to call to schedule and/or answer when they call.

## 2021-10-08 NOTE — Assessment & Plan Note (Signed)
Controlled.  Continue omeprazole 20 mg PRN. He is watching trigger foods.

## 2021-10-08 NOTE — Assessment & Plan Note (Signed)
Recent PSA level reviewed from Urology. Office notes from March 2023 reviewed per Urology S/P prostatectomy in 2021.

## 2021-10-08 NOTE — Assessment & Plan Note (Signed)
Immunizations UTD. PSA UTD. Colonoscopy overdue, discussed with patient today. Referral placed to GI.  Discussed the importance of a healthy diet and regular exercise in order for weight loss, and to reduce the risk of further co-morbidity.  Exam stable. Labs pending.  Follow up in 1 year for repeat physical.

## 2021-10-08 NOTE — Assessment & Plan Note (Signed)
He is taking levothyroxine correctly.   Continue levothyroxine 150 mcg daily. Repeat TSH pending.

## 2021-10-08 NOTE — Assessment & Plan Note (Signed)
Urine microscopic from June 2023 negative.

## 2021-10-08 NOTE — Patient Instructions (Signed)
Stop by the lab prior to leaving today. I will notify you of your results once received.   You are overdue for your repeat colonoscopy. Please call Dr. Georgeann Oppenheim office to schedule. If they call you, please call them back!  It was a pleasure to see you today!  Preventive Care 64-64 Years Old, Male Preventive care refers to lifestyle choices and visits with your health care provider that can promote health and wellness. Preventive care visits are also called wellness exams. What can I expect for my preventive care visit? Counseling During your preventive care visit, your health care provider may ask about your: Medical history, including: Past medical problems. Family medical history. Current health, including: Emotional well-being. Home life and relationship well-being. Sexual activity. Lifestyle, including: Alcohol, nicotine or tobacco, and drug use. Access to firearms. Diet, exercise, and sleep habits. Safety issues such as seatbelt and bike helmet use. Sunscreen use. Work and work Statistician. Physical exam Your health care provider will check your: Height and weight. These may be used to calculate your BMI (body mass index). BMI is a measurement that tells if you are at a healthy weight. Waist circumference. This measures the distance around your waistline. This measurement also tells if you are at a healthy weight and may help predict your risk of certain diseases, such as type 2 diabetes and high blood pressure. Heart rate and blood pressure. Body temperature. Skin for abnormal spots. What immunizations do I need?  Vaccines are usually given at various ages, according to a schedule. Your health care provider will recommend vaccines for you based on your age, medical history, and lifestyle or other factors, such as travel or where you work. What tests do I need? Screening Your health care provider may recommend screening tests for certain conditions. This may include: Lipid  and cholesterol levels. Diabetes screening. This is done by checking your blood sugar (glucose) after you have not eaten for a while (fasting). Hepatitis B test. Hepatitis C test. HIV (human immunodeficiency virus) test. STI (sexually transmitted infection) testing, if you are at risk. Lung cancer screening. Prostate cancer screening. Colorectal cancer screening. Talk with your health care provider about your test results, treatment options, and if necessary, the need for more tests. Follow these instructions at home: Eating and drinking  Eat a diet that includes fresh fruits and vegetables, whole grains, lean protein, and low-fat dairy products. Take vitamin and mineral supplements as recommended by your health care provider. Do not drink alcohol if your health care provider tells you not to drink. If you drink alcohol: Limit how much you have to 0-2 drinks a day. Know how much alcohol is in your drink. In the [place] one drink equals one 12 oz bottle of beer (355 mL), one 5 oz glass of wine (148 mL), or one 1 oz glass of hard liquor (44 mL). Lifestyle Brush your teeth every morning and night with fluoride toothpaste. Floss one time each day. Exercise for at least 30 minutes 5 or more days each week. Do not use any products that contain nicotine or tobacco. These products include cigarettes, chewing tobacco, and vaping devices, such as e-cigarettes. If you need help quitting, ask your health care provider. Do not use drugs. If you are sexually active, practice safe sex. Use a condom or other form of protection to prevent STIs. Take aspirin only as told by your health care provider. Make sure that you understand how much to take and what form to take. Work with your  health care provider to find out whether it is safe and beneficial for you to take aspirin daily. Find healthy ways to manage stress, such as: Meditation, yoga, or listening to music. Journaling. Talking to a trusted  person. Spending time with friends and family. Minimize exposure to UV radiation to reduce your risk of skin cancer. Safety Always wear your seat belt while driving or riding in a vehicle. Do not drive: If you have been drinking alcohol. Do not ride with someone who has been drinking. When you are tired or distracted. While texting. If you have been using any mind-altering substances or drugs. Wear a helmet and other protective equipment during sports activities. If you have firearms in your house, make sure you follow all gun safety procedures. What's next? Go to your health care provider once a year for an annual wellness visit. Ask your health care provider how often you should have your eyes and teeth checked. Stay up to date on all vaccines. This information is not intended to replace advice given to you by your health care provider. Make sure you discuss any questions you have with your health care provider. Document Revised: 09/04/2020 Document Reviewed: 09/04/2020 Elsevier Patient Education  [place].

## 2021-12-02 ENCOUNTER — Other Ambulatory Visit: Payer: Self-pay | Admitting: Primary Care

## 2021-12-02 ENCOUNTER — Telehealth: Payer: Self-pay | Admitting: Primary Care

## 2021-12-02 DIAGNOSIS — E039 Hypothyroidism, unspecified: Secondary | ICD-10-CM

## 2021-12-02 MED ORDER — LEVOTHYROXINE SODIUM 150 MCG PO TABS: ORAL_TABLET | ORAL | 0 refills | Status: DC

## 2021-12-02 NOTE — Telephone Encounter (Signed)
Caller Name: brode sculley  Call back phone #: [phone]  MEDICATION(S):  levothyroxine (SYNTHROID) 150 MCG tablet  Days of Med Remaining: 0  Has the patient contacted their pharmacy (YES/NO)? NO What did pharmacy advise?   Preferred Pharmacy:  [facility], [address]   ~~~Please advise patient/caregiver to allow 2-3 business days to process RX refills.

## 2021-12-02 NOTE — Telephone Encounter (Signed)
Please call patient:  Has he been taking his levothyroxine daily, everyday? Remind him of his lab draw that is pending for 12/10/21.   I will send a refill of his levothyroxine to the pharmacy now, but he does need to have his TSH rechecked.

## 2021-12-03 NOTE — Telephone Encounter (Signed)
Noted. Will await lab result.

## 2021-12-03 NOTE — Telephone Encounter (Signed)
Spoke to patient by telephone and was advised that he has taken his levothyroxine everyday until the past two days. Patient stated that he was out of the mediation for two days. Patient stated that he will be here next week for his lab appointment.

## 2021-12-10 ENCOUNTER — Other Ambulatory Visit (INDEPENDENT_AMBULATORY_CARE_PROVIDER_SITE_OTHER): Payer: 59

## 2021-12-10 DIAGNOSIS — E039 Hypothyroidism, unspecified: Secondary | ICD-10-CM

## 2021-12-10 LAB — TSH: TSH: 34.49 u[IU]/mL — ABNORMAL HIGH (ref 0.35–5.50)

## 2021-12-11 ENCOUNTER — Other Ambulatory Visit: Payer: Self-pay | Admitting: Primary Care

## 2021-12-11 DIAGNOSIS — E039 Hypothyroidism, unspecified: Secondary | ICD-10-CM

## 2021-12-11 MED ORDER — LEVOTHYROXINE SODIUM 175 MCG PO TABS: ORAL_TABLET | ORAL | 0 refills | Status: DC

## 2021-12-16 NOTE — Telephone Encounter (Signed)
Patient called about the ultrasound referral and wanted to know when would he be scheduled. Call back number [phone].

## 2021-12-16 NOTE — Telephone Encounter (Signed)
Called patient, informed he needed to call the imaging center to set up appointment.  Patient given phone number to contact them.

## 2021-12-22 ENCOUNTER — Ambulatory Visit
Admission: RE | Admit: 2021-12-22 | Discharge: 2021-12-22 | Disposition: A | Discharge status: Home / Self Care | Payer: 59 | Source: Ambulatory Visit | Attending: Primary Care | Admitting: Primary Care

## 2021-12-22 DIAGNOSIS — E039 Hypothyroidism, unspecified: Secondary | ICD-10-CM | POA: Diagnosis present

## 2021-12-25 ENCOUNTER — Other Ambulatory Visit: Payer: 59

## 2021-12-25 ENCOUNTER — Other Ambulatory Visit: Payer: Self-pay

## 2021-12-25 DIAGNOSIS — C61 Malignant neoplasm of prostate: Secondary | ICD-10-CM

## 2021-12-30 ENCOUNTER — Other Ambulatory Visit: Payer: 59

## 2021-12-30 DIAGNOSIS — C61 Malignant neoplasm of prostate: Secondary | ICD-10-CM

## 2021-12-31 ENCOUNTER — Ambulatory Visit: Payer: 59 | Admitting: Urology

## 2021-12-31 LAB — PSA: Prostate Specific Ag, Serum: <0.1 ng/mL (ref 0.0–4.0)

## 2022-01-06 ENCOUNTER — Encounter: Payer: Self-pay | Admitting: Urology

## 2022-01-06 ENCOUNTER — Ambulatory Visit (INDEPENDENT_AMBULATORY_CARE_PROVIDER_SITE_OTHER): Payer: 59 | Admitting: Urology

## 2022-01-06 VITALS — BP 151/94 | HR 93 | Ht 65.0 in | Wt 163.0 lb

## 2022-01-06 DIAGNOSIS — Z87898 Personal history of other specified conditions: Secondary | ICD-10-CM

## 2022-01-06 DIAGNOSIS — C61 Malignant neoplasm of prostate: Secondary | ICD-10-CM

## 2022-01-06 DIAGNOSIS — Z8546 Personal history of malignant neoplasm of prostate: Secondary | ICD-10-CM | POA: Diagnosis not present

## 2022-01-06 DIAGNOSIS — N5231 Erectile dysfunction following radical prostatectomy: Secondary | ICD-10-CM | POA: Diagnosis not present

## 2022-01-06 NOTE — Progress Notes (Signed)
01/06/2022 4:18 PM   Tony Hernandez 1955/11/20 [id number]  Referring provider: Pleas Koch, NP [place] [place],  [place] [address]  Chief Complaint  Patient presents with   Prostate Cancer    HPI: 66 year old male with a personal history of prostate cancer and erectile dysfunction returns today for follow-up.  He is s/p fusion prostate  biopsy on 07/27/2019. Pathology revealed 6 of 12 cores involved in low volume Gleason 3+3, 4+3, 3+4 up to 10% of tissue bilaterally at apex lateral, apex, and mid. He has low risk prostate cancer on left side and intermediate risk unfavorable prostate cancer on right side.  Prostate volume 47 g. iPSA 12.   He is s/p robotic assisted laparoscopic ight colectomy on 08/17/2019 as well as laparoscopic radical prostatectomy and bilateral pelvic lymph node dissection. surgical pathology on 08/17/2019 showed Gleason 3+4, pT2, pN0, negative bladder neck, margins, and nodes. No extraprostatic extension   He has been struggling with erectile dysfunction.  He tried and failed sildenafil.  He has been using Trimix injections.  At last visit, 3 mcg is not effective we will despite being effective in the office.  He was educated again on titrating the dose and proper administration.  He reports that since last visit, he is been doing a lot of traveling.  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  He never actually went to fill the trimix medication and has yet to use it.  He has minimal to no leakage and does not wear a pad.  PSA remains undetectable as of 12/30/2021.  PMH: Past Medical History:  Diagnosis Date   Arthritis    knees   GERD (gastroesophageal reflux disease)    Thyroid disease    Wears dentures     Surgical History: Past Surgical History:  Procedure Laterality Date   CHOLECYSTECTOMY  2018   COLONOSCOPY WITH PROPOFOL N/A 05/19/2019   Procedure: COLONOSCOPY WITH PROPOFOL;  Surgeon: [name], MD;  Location: [facility];   Service: Endoscopy;  Laterality: N/A;  Priority 4   POLYPECTOMY  05/19/2019   Procedure: POLYPECTOMY;  Surgeon: [name], MD;  Location: [facility];  Service: Endoscopy;;   ROBOT ASSISTED LAPAROSCOPIC RADICAL PROSTATECTOMY Bilateral 08/17/2019   Procedure: XI ROBOTIC ASSISTED LAPAROSCOPIC PROSTATECTOMY, BILATERAL LYMPH NODE DISSECTION;  Surgeon: Hollice Espy, MD;  Location: [facility];  Service: Urology;  Laterality: Bilateral;    Home Medications:  Allergies as of 01/06/2022   No Known Allergies      Medication List        Accurate as of January 06, 2022  4:18 PM. If you have any questions, ask your nurse or doctor.          levothyroxine 175 MCG tablet Commonly known as: SYNTHROID Take 1 tablet by mouth every morning on an empty stomach with water only.  No food or other medications for 30 minutes.   NONFORMULARY OR COMPOUNDED ITEM Trimix (30/1/10)-(Pap/Phent/PGE)  Dosage: Inject 0.3-0.7  cc per injection  Vial 66m  Qty #5 Refills 6  [phone] [phone]  omeprazole 20 MG capsule Commonly known as: PRILOSEC Take 1 capsule (20 mg total) by mouth daily.   sildenafil 20 MG tablet Commonly known as: REVATIO Take 1-5 tablets (20-100 mg total) by mouth 3 (three) times daily.        Allergies: No Known Allergies  Family History: Family History  Problem Relation Age of Onset   Lung cancer Mother 752  Diabetes Paternal Grandmother    Colon cancer Sister 41    Social History:  reports that he quit smoking about 20 years ago. His smoking use included cigarettes. He has never used smokeless tobacco. He reports that he does not currently use drugs after having used the following drugs: Cocaine and Marijuana. He reports that he does not drink alcohol.   Physical Exam: BP (!) 151/94   Pulse 93   Ht '5\' 5"'$  (1.651 m)   Wt 163 lb (73.9 kg)   BMI 27.12 kg/m   Constitutional:  Alert and oriented, No acute distress. HEENT:  [place] AT, moist mucus membranes.  Trachea midline, no masses. Cardiovascular: No clubbing, cyanosis, or edema. Skin: No rashes, bruises or suspicious lesions. Neurologic: Grossly intact, no focal deficits, moving all 4 extremities. Psychiatric: Normal mood and affect.  Laboratory Data: Lab Results  Component Value Date   WBC 7.4 09/03/2021   HGB 12.8 (L) 09/03/2021   HCT 40.7 09/03/2021   MCV 78.7 09/03/2021   PLT 303.0 09/03/2021    Lab Results  Component Value Date   CREATININE 1.17 09/03/2021    Lab Results  Component Value Date   HGBA1C 6.1 (H) 08/22/2019   Assessment & Plan:    1. Prostate cancer ([place]) No evidence of disease, will continue to check PSA every 6 months and then transition to annually likely in 12 months from now - PSA; Future - PSA; Future  2. 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  He has tried trimix but not successful at home but is yet to try again after repeat counseling and teaching.  Alternatives including VED and penile prosthesis were discussed today at length.  He is moderately interested in penile prosthesis and will discuss this further with his wife.  He was provided with literature about this today.  If he would like to pursue this, we will arrange for follow-up in [place], [place] or [facility] - PSA; Future - PSA; Future   Return for lab only PSA 6 months, 1 year follow up with PSA prior 1 year sam or shannon.  Hollice Espy, MD  [facility] [address], [place], [place] [address] [phone]  I spent 31 total minutes on the day of the encounter including pre-visit review of the medical record, face-to-face time with the patient, and post visit ordering of labs/imaging/tests.  The majority of this time was spent counseling the patient about his treatment options.

## 2022-01-12 ENCOUNTER — Telehealth: Payer: Self-pay | Admitting: Physician Assistant

## 2022-01-19 ENCOUNTER — Ambulatory Visit: Payer: 59 | Admitting: Physician Assistant

## 2022-01-20 ENCOUNTER — Encounter: Payer: Self-pay | Admitting: Physician Assistant

## 2022-02-09 ENCOUNTER — Other Ambulatory Visit: Payer: Self-pay | Admitting: Primary Care

## 2022-02-09 DIAGNOSIS — E039 Hypothyroidism, unspecified: Secondary | ICD-10-CM

## 2022-02-23 ENCOUNTER — Other Ambulatory Visit (INDEPENDENT_AMBULATORY_CARE_PROVIDER_SITE_OTHER): Payer: 59

## 2022-02-23 DIAGNOSIS — E039 Hypothyroidism, unspecified: Secondary | ICD-10-CM | POA: Diagnosis not present

## 2022-02-23 LAB — TSH: TSH: 0.05 u[IU]/mL — ABNORMAL LOW (ref 0.35–5.50)

## 2022-02-24 ENCOUNTER — Other Ambulatory Visit: Payer: Self-pay | Admitting: Primary Care

## 2022-02-24 DIAGNOSIS — E039 Hypothyroidism, unspecified: Secondary | ICD-10-CM

## 2022-02-24 MED ORDER — LEVOTHYROXINE SODIUM 175 MCG PO TABS: ORAL_TABLET | ORAL | 0 refills | Status: DC

## 2022-03-12 ENCOUNTER — Other Ambulatory Visit: Payer: Self-pay

## 2022-03-12 ENCOUNTER — Emergency Department: Payer: 59

## 2022-03-12 ENCOUNTER — Observation Stay
Admission: EM | Admit: 2022-03-12 | Discharge: 2022-03-13 | Disposition: A | Discharge status: Home / Self Care | Payer: 59 | Attending: Family Medicine | Admitting: Family Medicine

## 2022-03-12 ENCOUNTER — Encounter: Payer: Self-pay | Admitting: Emergency Medicine

## 2022-03-12 DIAGNOSIS — K219 Gastro-esophageal reflux disease without esophagitis: Secondary | ICD-10-CM | POA: Diagnosis present

## 2022-03-12 DIAGNOSIS — E039 Hypothyroidism, unspecified: Secondary | ICD-10-CM | POA: Diagnosis present

## 2022-03-12 DIAGNOSIS — I639 Cerebral infarction, unspecified: Principal | ICD-10-CM

## 2022-03-12 DIAGNOSIS — Z79899 Other long term (current) drug therapy: Secondary | ICD-10-CM | POA: Diagnosis not present

## 2022-03-12 DIAGNOSIS — Z87891 Personal history of nicotine dependence: Secondary | ICD-10-CM | POA: Diagnosis not present

## 2022-03-12 DIAGNOSIS — Z8673 Personal history of transient ischemic attack (TIA), and cerebral infarction without residual deficits: Secondary | ICD-10-CM | POA: Diagnosis present

## 2022-03-12 DIAGNOSIS — R200 Anesthesia of skin: Secondary | ICD-10-CM | POA: Diagnosis present

## 2022-03-12 LAB — COMPREHENSIVE METABOLIC PANEL
ALT: 18 U/L (ref 0–44)
AST: 16 U/L (ref 15–41)
Albumin: 4.1 g/dL (ref 3.5–5.0)
Alkaline Phosphatase: 62 U/L (ref 38–126)
Anion gap: 8 (ref 5–15)
BUN: 13 mg/dL (ref 8–23)
CO2: 23 mmol/L (ref 22–32)
Calcium: 9.2 mg/dL (ref 8.9–10.3)
Chloride: 107 mmol/L (ref 98–111)
Creatinine, Ser: 0.84 mg/dL (ref 0.61–1.24)
GFR, Estimated: >60 mL/min (ref >60)
Glucose, Bld: 108 mg/dL — ABNORMAL HIGH (ref 70–99)
Potassium: 3.7 mmol/L (ref 3.5–5.1)
Sodium: 138 mmol/L (ref 135–145)
Total Bilirubin: 0.5 mg/dL (ref 0.3–1.2)
Total Protein: 7.2 g/dL (ref 6.5–8.1)

## 2022-03-12 LAB — CBC
HCT: 40.7 % (ref 39.0–52.0)
Hemoglobin: 12.6 g/dL — ABNORMAL LOW (ref 13.0–17.0)
MCH: 24 pg — ABNORMAL LOW (ref 26.0–34.0)
MCHC: 31 g/dL (ref 30.0–36.0)
MCV: 77.7 fL — ABNORMAL LOW (ref 80.0–100.0)
Platelets: 319 10*3/uL (ref 150–400)
RBC: 5.24 MIL/uL (ref 4.22–5.81)
RDW: 14.1 % (ref 11.5–15.5)
WBC: 8.3 10*3/uL (ref 4.0–10.5)
nRBC: 0 % (ref 0.0–0.2)

## 2022-03-12 LAB — APTT: aPTT: 30 seconds (ref 24–36)

## 2022-03-12 LAB — DIFFERENTIAL
Abs Immature Granulocytes: 0.04 10*3/uL (ref 0.00–0.07)
Basophils Absolute: 0.1 10*3/uL (ref 0.0–0.1)
Basophils Relative: 1 %
Eosinophils Absolute: 0.4 10*3/uL (ref 0.0–0.5)
Eosinophils Relative: 5 %
Immature Granulocytes: 1 %
Lymphocytes Relative: 29 %
Lymphs Abs: 2.4 10*3/uL (ref 0.7–4.0)
Monocytes Absolute: 0.7 10*3/uL (ref 0.1–1.0)
Monocytes Relative: 8 %
Neutro Abs: 4.6 10*3/uL (ref 1.7–7.7)
Neutrophils Relative %: 56 %

## 2022-03-12 LAB — PROTIME-INR
INR: 1 (ref 0.8–1.2)
Prothrombin Time: 12.9 seconds (ref 11.4–15.2)

## 2022-03-12 MED ORDER — ENOXAPARIN SODIUM 40 MG/0.4ML IJ SOSY
40.0000 mg | PREFILLED_SYRINGE | INTRAMUSCULAR | Status: DC
  Administered 2022-03-13: 40 mg via SUBCUTANEOUS
  Filled 2022-03-12: qty 0.4

## 2022-03-12 MED ORDER — ONDANSETRON HCL 4 MG PO TABS: 4.0000 mg | ORAL_TABLET | Freq: Four times a day (QID) | ORAL | Status: DC | PRN

## 2022-03-12 MED ORDER — ACETAMINOPHEN 650 MG RE SUPP: 650.0000 mg | Freq: Four times a day (QID) | RECTAL | Status: DC | PRN

## 2022-03-12 MED ORDER — ACETAMINOPHEN 325 MG PO TABS: 650.0000 mg | ORAL_TABLET | Freq: Four times a day (QID) | ORAL | Status: DC | PRN

## 2022-03-12 MED ORDER — PANTOPRAZOLE SODIUM 40 MG PO TBEC
40.0000 mg | DELAYED_RELEASE_TABLET | Freq: Every day | ORAL | Status: DC
  Administered 2022-03-13: 40 mg via ORAL
  Filled 2022-03-12: qty 1

## 2022-03-12 MED ORDER — LEVOTHYROXINE SODIUM 50 MCG PO TABS
175.0000 ug | ORAL_TABLET | Freq: Every day | ORAL | Status: DC
  Administered 2022-03-13: 175 ug via ORAL
  Filled 2022-03-12: qty 4

## 2022-03-12 MED ORDER — STROKE: EARLY STAGES OF RECOVERY BOOK: Freq: Once | Status: AC

## 2022-03-12 MED ORDER — ONDANSETRON HCL 4 MG/2ML IJ SOLN: 4.0000 mg | Freq: Four times a day (QID) | INTRAMUSCULAR | Status: DC | PRN

## 2022-03-12 NOTE — Assessment & Plan Note (Addendum)
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  Workup includes negative Dopplers, negative echo, negative angiography. Dual antiplatelet therapy for 21 days with Plavix and aspirin followed by 81 mg aspirin and statin therapy Outpatient OT Will be given outpatient cardiac monitoring.

## 2022-03-12 NOTE — H&P (Signed)
History and Physical    Patient: Tony Hernandez JSH:[id number] DOB: 04-22-1955 DOA: 03/12/2022 DOS: the patient was seen and examined on 03/12/2022 PCP: Tony Koch, NP  Patient coming from: Home  Chief Complaint:  Chief Complaint  Patient presents with   Numbness   HPI: Tony Hernandez is a 66 y.o. male with medical history significant of hypothyroidism, and chronic vertigo who presented with left upper extremity weakness, left hand apraxia and left upper extremity paresthesias.  Yesterday, 03/11/22, evening around 8 pm, patient reported not feeling "well", but no specific symptoms and went to sleep. This morning when he woke up he noted left upper extremity numbness, during work he noticed he was not able to use his left hand. He decided to come to the Ed for further evaluation. Through the course of the day his left upper extremity neurologic deficits seems to be getting better.  Denies any other focal deficit. He denies having this symptoms in the past.   He reports having chronic vertigo, that has not changes. No chest pain or palpitations.  Review of Systems: As mentioned in the history of present illness. All other systems reviewed and are negative. Past Medical History:  Diagnosis Date   Arthritis    knees   GERD (gastroesophageal reflux disease)    Thyroid disease    Wears dentures    Past Surgical History:  Procedure Laterality Date   CHOLECYSTECTOMY  2018   COLONOSCOPY WITH PROPOFOL N/A 05/19/2019   Procedure: COLONOSCOPY WITH PROPOFOL;  Surgeon: [name], MD;  Location: [place];  Service: Endoscopy;  Laterality: N/A;  Priority 4   POLYPECTOMY  05/19/2019   Procedure: POLYPECTOMY;  Surgeon: [name], MD;  Location: [place];  Service: Endoscopy;;   ROBOT ASSISTED LAPAROSCOPIC RADICAL PROSTATECTOMY Bilateral 08/17/2019   Procedure: XI ROBOTIC ASSISTED LAPAROSCOPIC PROSTATECTOMY, BILATERAL LYMPH NODE DISSECTION;  Surgeon: Hollice Espy, MD;   Location: [facility];  Service: Urology;  Laterality: Bilateral;   Social History:  reports that he quit smoking about 20 years ago. His smoking use included cigarettes. He has never used smokeless tobacco. He reports that he does not currently use drugs after having used the following drugs: Cocaine and Marijuana. He reports that he does not drink alcohol.  No Known Allergies  Family History  Problem Relation Age of Onset   Lung cancer Mother 32   Diabetes Paternal Grandmother    Colon cancer Sister 40    Prior to Admission medications   Medication Sig Start Date End Date Taking? Authorizing Provider  levothyroxine (SYNTHROID) 175 MCG tablet Take 1 tablet 6 days weekly and 1/2 tablet one day weekly by mouth every morning on an empty stomach with water only.  No food or other medications for 30 minutes. 02/24/22   Tony Koch, NP  NONFORMULARY OR COMPOUNDED ITEM Trimix (30/1/10)-(Pap/Phent/PGE)  Dosage: Inject 0.3-0.7  cc per injection  Vial 74m  Qty #5 Refills 6  [phone] [phone]/16/23   MZara CouncilA, PA-C  omeprazole (PRILOSEC) 20 MG capsule Take 1 capsule (20 mg total) by mouth daily. 09/03/21   CMichela Pitcher NP  sildenafil (REVATIO) 20 MG tablet Take 1-5 tablets (20-100 mg total) by mouth 3 (three) times daily. 01/09/21   BHollice Espy MD    Physical Exam: Vitals:   03/12/22 1358 03/12/22 1728 03/12/22 2015 03/12/22 2115  BP: (!) 152/83 (!) 150/80  (!) 143/85  Pulse: 94 90 83 80  Resp: 18  18  18  Temp: 98.7 F (37.1 C)   98.3 F (36.8 C)  TempSrc: Oral     SpO2: 98% 98% 100% 100%  Weight: 74.8 kg     Height: '5\' 5"'$  (1.651 m)      Neurology awake and alert. Left hand grip is 4/5, proxima strength is 5/5 left and right upper extremity, right hand grip is 5/5. Lower extremities with no deficits. No facial droop and cranial nerves 2 to 12 intact.  ENT with no pallor Cardiovascular with S1 and S2 present and  rhythmic Respiratory with no rales or wheezing Abdomen with no distention  No lower extremity edema. Data Reviewed:   Na 138, K 3,7 Cl 107 bicarbonate 23, glucose 108 bun 13 cr 0,84  Wbc 8,3 hgb 12.6 plt 319  Head CT with no acute changes.  MRI brain with small acute right MCA territory infarct involving the right frontoparietal region, with no associated hemorrhage or mass effect.  Small remote right cerebellar infarct.  Cervical spine MRI with multilevel foraminal stenosis. Marrow edema C4 to C5 interspace.   EKG 86 bpm, normal axis, normal intervals, sinus rhythm with J point elevation V2 and V3 with no significant ST segment or T wave changes.   Mr. Tony Hernandez is being admitted with the working diagnosis of acute ischemic cerebrovascular event.   Assessment and Plan: * Acute CVA (cerebrovascular accident) [facility]) Patient with acute CVA in the MCA distribution right frontoparietal and chronic right cerebellar infarct.  His neurologic deficit is improving.   Plan to continue neuro checks per unit protocol Add 81 mg aspirin and statin therapy Follow up with lipid, profile, echocardiogram and vascular imaging.  Follow up with neurology recommendations.  Follow up with Pt and Ot recommendations.   Hypothyroidism Continue with levothyroxine.   GERD (gastroesophageal reflux disease) Continue with pantoprazole.       Advance Care Planning:   Code Status: Full Code   Consults: Neurology (please inform in am)   Family Communication: I spoke with patient's wife at the bedside, we talked in detail about patient's condition, plan of care and prognosis and all questions were addressed.   Severity of Illness: The appropriate patient status for this patient is OBSERVATION. Observation status is judged to be reasonable and necessary in order to provide the required intensity of service to ensure the patient's safety. The patient's presenting symptoms, physical exam findings, and initial  radiographic and laboratory data in the context of their medical condition is felt to place them at decreased risk for further clinical deterioration. Furthermore, it is anticipated that the patient will be medically stable for discharge from the hospital within 2 midnights of admission.   Author: Tawni Millers, MD 03/12/2022 11:24 PM  For on call review [email or web address].

## 2022-03-12 NOTE — ED Notes (Signed)
Pt back from MRI 

## 2022-03-12 NOTE — ED Provider Triage Note (Signed)
Emergency Medicine Provider Triage Evaluation Note  Tony Hernandez, a 66 y.o. male  was evaluated in triage.  Pt complains of LUE tingling from the shoulder to the arm as well as some decreased active range of motion.  Patient denies any preceding injury, trauma, or fall.  He does endorse some left arm grip changes as well.  He gives a remote history of similar symptoms about 2 years ago related to some heavy physical labor.  He otherwise denies any slurred speech, vision changes, or head injury.  Review of Systems  Positive: LUE weakness, parethesias Negative: Head injury, LOC  Physical Exam  BP (!) 152/83 (BP Location: Right Arm)   Pulse 94   Temp 98.7 F (37.1 C) (Oral)   Resp 18   Ht '5\' 5"'$  (1.651 m)   Wt 74.8 kg   SpO2 98%   BMI 27.46 kg/m  Gen:   Awake, no distress  NAD Resp:  Normal effort CTA MSK:   Moves extremities without difficulty decreased composite fist on the left Other:    Medical Decision Making  Medically screening exam initiated at 2:03 PM.  Appropriate orders placed.  Tony Hernandez was informed that the remainder of the evaluation will be completed by another provider, this initial triage assessment does not replace that evaluation, and the importance of remaining in the ED until their evaluation is complete.  Patient to the ED for evaluation of LUE, paresthesias and weakness.  No preceding injury or trauma reported.   Melvenia Needles, PA-C 03/12/22 1418

## 2022-03-12 NOTE — ED Provider Notes (Signed)
[facility] Provider Note    Event Date/Time   First MD Initiated Contact with Patient 03/12/22 1728     (approximate)   History   Chief Complaint Numbness   HPI Tony Hernandez is a 66 y.o. male, history of hypothyroidism, GERD, arthritis, presents to the emergency department for evaluation of left arm/hand numbness that started last night approximately 8 PM.  Patient states that he has been able to lift his left arm, but has numbness/tingling along the upper arm and decreased grip strength.  He states that he can barely move his fingers.  Denies any recent falls or injuries.  Denies fevers or chills, chest pain, neck pain, shortness of breath, abdominal pain, vision change, hearing changes, rash/lesions, dizziness/lightheadedness, or paresthesias along the face/other extremities.  History Limitations: No limitations.        Physical Exam  Triage Vital Signs: ED Triage Vitals [03/12/22 1358]  Enc Vitals Group     BP (!) 152/83     Pulse Rate 94     Resp 18     Temp 98.7 F (37.1 C)     Temp Source Oral     SpO2 98 %     Weight 165 lb (74.8 kg)     Height '5\' 5"'$  (1.651 m)     Head Circumference      Peak Flow      Pain Score 0     Pain Loc      Pain Edu?      Excl. in [place]?     Most recent vital signs: Vitals:   03/12/22 2015 03/12/22 2115  BP:  (!) 143/85  Pulse: 83 80  Resp:  18  Temp:  98.3 F (36.8 C)  SpO2: 100% 100%    General: Awake, NAD.  Skin: Warm, dry. No rashes or lesions.  Eyes: PERRL. Conjunctivae normal.  CV: Good peripheral perfusion.  Resp: Normal effort.  Abd: Soft, non-tender. No distention.  Musculoskeletal: Normal ROM of all extremities.  Focused Exam: Decreased sensation diffusely across the left upper extremity, particular along the ulnar aspect of the upper arm.  Patient is able to move his fingers very slightly, but overall is unable to provide any grip strength.  He is able to lift his upper extremity and  resist external forces.  Normal pulses.  No facial droop.  Normal speech.  No ataxia.  Normal strength and sensation in the right upper extremity and lower extremities bilaterally.    Physical Exam    ED Results / Procedures / Treatments  Labs (all labs ordered are listed, but only abnormal results are displayed) Labs Reviewed  CBC - Abnormal; Notable for the following components:      Result Value   Hemoglobin 12.6 (*)    MCV 77.7 (*)    MCH 24.0 (*)    All other components within normal limits  COMPREHENSIVE METABOLIC PANEL - Abnormal; Notable for the following components:   Glucose, Bld 108 (*)    All other components within normal limits  PROTIME-INR  APTT  DIFFERENTIAL  CBG MONITORING, ED     EKG Sinus rhythm, rate of 86, no significant ST segment changes, normal QRS, no QT prolongation, no significant axis deviations.    RADIOLOGY  ED Provider Interpretation:   MR Cervical Spine Wo Contrast  Result Date: 03/12/2022 CLINICAL DATA:  Initial evaluation for left upper extremity numbness and weakness. EXAM: MRI CERVICAL SPINE WITHOUT CONTRAST TECHNIQUE: Multiplanar, multisequence MR imaging of  the cervical spine was performed. No intravenous contrast was administered. COMPARISON:  None Available. FINDINGS: Alignment: Straightening with mild reversal of the normal cervical lordosis. Trace degenerative retrolisthesis of C5 on C6. Vertebrae: Vertebral body height maintained without acute or chronic fracture. Bone marrow signal intensity within normal limits. No worrisome osseous lesions. Discogenic reactive endplate change with marrow edema present about the C4-5 interspace. More mild reactive endplate changes noted at C5-6 and C6-7 as well. Cord: Normal signal and morphology. Posterior Fossa, vertebral arteries, paraspinal tissues: Unremarkable. Disc levels: C2-C3: Small central disc protrusion indents the ventral thecal sac. Mild facet hypertrophy on the left. No significant  spinal stenosis. Foramina remain patent. C3-C4: Disc bulge with bilateral uncovertebral spurring, worse on the right. Superimposed tiny central disc protrusion indents the ventral thecal sac. Mild spinal stenosis with minimal cord flattening but no cord signal changes. Mild left with moderate right C4 foraminal stenosis. C4-C5: Degenerative intervertebral disc space narrowing with circumferential disc osteophyte complex. Flattening and partial effacement of the ventral thecal sac. Resultant mild-to-moderate spinal stenosis with moderate bilateral C5 foraminal narrowing. C5-C6: Degenerative intervertebral disc space narrowing with circumferential disc osteophyte complex. Broad posterior component indents and partially effaces the ventral thecal sac. Mild cord flattening without cord signal changes. Moderate spinal stenosis. Moderate bilateral C6 foraminal narrowing. C6-C7: Degenerative intervertebral disc space narrowing with circumferential disc osteophyte complex. Flattening and partial effacement of the ventral thecal sac. Moderate spinal stenosis with mild cord flattening, but no cord signal changes. Moderate to severe left with moderate right C7 foraminal stenosis. C7-T1: Negative interspace. Mild facet hypertrophy. No significant canal or foraminal stenosis. IMPRESSION: 1. Multilevel cervical spondylosis with resultant mild to moderate diffuse spinal stenosis at C3-4 through C6-7. 2. Multifactorial degenerative changes with resultant multilevel foraminal narrowing as above. Notable findings include moderate right C4 foraminal stenosis, moderate bilateral C5 and C6 foraminal narrowing, with moderate to severe left and moderate right C7 foraminal stenosis. 3. Discogenic reactive endplate change with marrow edema about the C4-5 interspace. Finding could contribute to underlying neck pain. Electronically Signed   By: Jeannine Boga M.D.   On: 03/12/2022 22:44   MR BRAIN WO CONTRAST  Result Date:  03/12/2022 CLINICAL DATA:  Initial evaluation for neuro deficit, stroke suspected. EXAM: MRI HEAD WITHOUT CONTRAST TECHNIQUE: Multiplanar, multiecho pulse sequences of the brain and surrounding structures were obtained without intravenous contrast. COMPARISON:  CT from earlier the same day. FINDINGS: Brain: Cerebral volume within normal limits. Minimal hazy FLAIR signal intensity noted involving the periventricular white matter, most likely related chronic microvascular ischemic disease, less than expected for age. Small remote right cerebellar infarct noted. Patchy restricted diffusion involving the cortical and subcortical aspect of the right frontoparietal region, consistent with a small acute right MCA territory infarct. No associated hemorrhage or mass effect. No other evidence for acute or subacute ischemia. Gray-white matter differentiation otherwise maintained. No other areas of chronic cortical infarction. No acute or chronic intracranial blood products. No mass lesion, midline shift or mass effect. No hydrocephalus or extra-axial fluid collection. Pituitary gland within normal limits. Vascular: Major intracranial vascular flow voids are maintained. Skull and upper cervical spine: Craniocervical junction within normal limits. Bone marrow signal intensity normal. No scalp soft tissue abnormality. Sinuses/Orbits: Globes orbital soft tissues within normal limits. Mild scattered mucosal thickening noted throughout the paranasal sinuses. Moderate right mastoid effusion noted. Visualized nasopharynx unremarkable. Other: None. IMPRESSION: 1. Small acute right MCA territory infarct involving the right frontoparietal region. No associated hemorrhage or mass effect. 2.  Small remote right cerebellar infarct. 3. Otherwise normal brain MRI for age. Electronically Signed   By: Jeannine Boga M.D.   On: 03/12/2022 22:39   CT HEAD WO CONTRAST  Result Date: 03/12/2022 CLINICAL DATA:  Stroke suspected EXAM: CT  HEAD WITHOUT CONTRAST TECHNIQUE: Contiguous axial images were obtained from the base of the skull through the vertex without intravenous contrast. RADIATION DOSE REDUCTION: This exam was performed according to the departmental dose-optimization program which includes automated exposure control, adjustment of the mA and/or kV according to patient size and/or use of iterative reconstruction technique. COMPARISON:  None Available. FINDINGS: Brain: No evidence of acute infarction, hemorrhage, hydrocephalus, extra-axial collection or mass lesion/mass effect. Vascular: No hyperdense vessel or unexpected calcification. Skull: Normal. Negative for fracture or focal lesion. Sinuses/Orbits: No acute finding. Other: None. IMPRESSION: No hemorrhage or CT evidence of an acute infarct. Electronically Signed   By: Marin Roberts M.D.   On: 03/12/2022 15:02    PROCEDURES:  Critical Care performed: N/A  .Critical Care  Performed by: Teodoro Spray, PA Authorized by: Teodoro Spray, PA       MEDICATIONS ORDERED IN ED: Medications - No data to display   IMPRESSION / MDM / [place] / ED COURSE  I reviewed the triage vital signs and the nursing notes.                              Differential diagnosis includes, but is not limited to, cervical radiculopathy, CVA, brain tumor/mass, ulnar nerve entrapment, thoracic outlet syndrome.  ED Course Patient appears well, vitals within normal limits.  NAD.  CBC shows no leukocytosis.  Mild anemia present with hemoglobin of 12.6.  CMP shows no electrolyte abnormalities, AKI, or transaminitis.   Assessment/Plan Patient presents with left-sided arm numbness and decreased grip strength since 8 PM last night.  He does appear to have significant objective decreased grip strength and sensation in the left upper extremity.  MRI shows small acute right MCA territory infarct involving the right frontoparietal region, as well as a small remote right  cerebellar infarct.  I suspect this is likely the etiology of his symptoms.  Spoke to the on-call hospitalist who agreed to admission for observation and follow-up with neurology in the morning.    Patient's presentation is most consistent with acute presentation with potential threat to life or bodily function.       FINAL CLINICAL IMPRESSION(S) / ED DIAGNOSES   Final diagnoses:  Cerebrovascular accident (CVA), unspecified mechanism ([place])     Rx / DC Orders   ED Discharge Orders     None        Note:  This document was prepared using Dragon voice recognition software and may include unintentional dictation errors.   Teodoro Spray, [place] 03/12/22 2329    Arta Silence, MD 03/12/22 2337

## 2022-03-12 NOTE — Assessment & Plan Note (Addendum)
Continue with levothyroxine.  Last TSH was significantly suppressed and adjustments were made to his medications.  He will follow-up with his primary care for this.

## 2022-03-12 NOTE — Assessment & Plan Note (Addendum)
Patient not currently taking PPI we will hold as it can interact with Plavix

## 2022-03-12 NOTE — ED Triage Notes (Signed)
Patient arrives ambulatory by POV c/o left arm/hand numbness since last night about 8pm. Patient has decreased grip strength to left hand, left arm drip and decreased sensation to left arm.

## 2022-03-13 ENCOUNTER — Observation Stay: Payer: 59

## 2022-03-13 ENCOUNTER — Observation Stay (HOSPITAL_BASED_OUTPATIENT_CLINIC_OR_DEPARTMENT_OTHER)
Admit: 2022-03-13 | Discharge: 2022-03-13 | Disposition: A | Discharge status: Home / Self Care | Payer: 59 | Attending: Internal Medicine | Admitting: Internal Medicine

## 2022-03-13 DIAGNOSIS — I639 Cerebral infarction, unspecified: Secondary | ICD-10-CM | POA: Diagnosis not present

## 2022-03-13 DIAGNOSIS — I6389 Other cerebral infarction: Secondary | ICD-10-CM | POA: Diagnosis not present

## 2022-03-13 LAB — BASIC METABOLIC PANEL
Anion gap: 6 (ref 5–15)
Anion gap: 7 (ref 5–15)
BUN: 13 mg/dL (ref 8–23)
BUN: 16 mg/dL (ref 8–23)
CO2: 23 mmol/L (ref 22–32)
CO2: 25 mmol/L (ref 22–32)
Calcium: 9.3 mg/dL (ref 8.9–10.3)
Calcium: 9.4 mg/dL (ref 8.9–10.3)
Chloride: 107 mmol/L (ref 98–111)
Chloride: 108 mmol/L (ref 98–111)
Creatinine, Ser: 0.85 mg/dL (ref 0.61–1.24)
Creatinine, Ser: 0.99 mg/dL (ref 0.61–1.24)
GFR, Estimated: >60 mL/min (ref >60)
GFR, Estimated: >60 mL/min (ref >60)
Glucose, Bld: 101 mg/dL — ABNORMAL HIGH (ref 70–99)
Glucose, Bld: 92 mg/dL (ref 70–99)
Potassium: 3.7 mmol/L (ref 3.5–5.1)
Potassium: 3.9 mmol/L (ref 3.5–5.1)
Sodium: 137 mmol/L (ref 135–145)
Sodium: 139 mmol/L (ref 135–145)

## 2022-03-13 LAB — LIPID PANEL
Cholesterol: 142 mg/dL (ref 0–200)
Cholesterol: 148 mg/dL (ref 0–200)
HDL: 35 mg/dL — ABNORMAL LOW (ref >40)
HDL: 39 mg/dL — ABNORMAL LOW (ref >40)
LDL Cholesterol: 88 mg/dL (ref 0–99)
LDL Cholesterol: 98 mg/dL (ref 0–99)
Total CHOL/HDL Ratio: 3.8 RATIO
Total CHOL/HDL Ratio: 4.1 RATIO
Triglycerides: 53 mg/dL (ref <150)
Triglycerides: 96 mg/dL (ref <150)
VLDL: 11 mg/dL (ref 0–40)
VLDL: 19 mg/dL (ref 0–40)

## 2022-03-13 LAB — HEMOGLOBIN A1C
Hgb A1c MFr Bld: 6.1 % — ABNORMAL HIGH (ref 4.8–5.6)
Mean Plasma Glucose: 128 mg/dL

## 2022-03-13 LAB — CREATININE, SERUM
Creatinine, Ser: 0.9 mg/dL (ref 0.61–1.24)
GFR, Estimated: >60 mL/min (ref >60)

## 2022-03-13 LAB — ECHOCARDIOGRAM COMPLETE
Area-P 1/2: 2.35 cm2
Calc EF: 63.2 %
Height: 65 in
S' Lateral: 3.8 cm
Single Plane A2C EF: 65.2 %
Single Plane A4C EF: 64.2 %
Weight: 2640 oz

## 2022-03-13 LAB — HIV ANTIBODY (ROUTINE TESTING W REFLEX): HIV Screen 4th Generation wRfx: NONREACTIVE

## 2022-03-13 LAB — CBC
HCT: 39.5 % (ref 39.0–52.0)
Hemoglobin: 12.4 g/dL — ABNORMAL LOW (ref 13.0–17.0)
MCH: 24.4 pg — ABNORMAL LOW (ref 26.0–34.0)
MCHC: 31.4 g/dL (ref 30.0–36.0)
MCV: 77.6 fL — ABNORMAL LOW (ref 80.0–100.0)
Platelets: 330 10*3/uL (ref 150–400)
RBC: 5.09 MIL/uL (ref 4.22–5.81)
RDW: 14 % (ref 11.5–15.5)
WBC: 8 10*3/uL (ref 4.0–10.5)
nRBC: 0 % (ref 0.0–0.2)

## 2022-03-13 MED ORDER — ASPIRIN 81 MG PO TBEC: 81.0000 mg | DELAYED_RELEASE_TABLET | Freq: Every day | ORAL | 12 refills | Status: DC

## 2022-03-13 MED ORDER — ATORVASTATIN CALCIUM 40 MG PO TABS: 40.0000 mg | ORAL_TABLET | Freq: Every day | ORAL | 0 refills | Status: DC

## 2022-03-13 MED ORDER — IOHEXOL 350 MG/ML SOLN
75.0000 mL | Freq: Once | INTRAVENOUS | Status: AC | PRN
  Administered 2022-03-13: 75 mL via INTRAVENOUS

## 2022-03-13 MED ORDER — ATORVASTATIN CALCIUM 20 MG PO TABS
40.0000 mg | ORAL_TABLET | Freq: Every day | ORAL | Status: DC
  Administered 2022-03-13: 40 mg via ORAL
  Filled 2022-03-13: qty 2

## 2022-03-13 MED ORDER — CLOPIDOGREL BISULFATE 75 MG PO TABS: 75.0000 mg | ORAL_TABLET | Freq: Every day | ORAL | 0 refills | Status: AC

## 2022-03-13 MED ORDER — ASPIRIN 81 MG PO TBEC
81.0000 mg | DELAYED_RELEASE_TABLET | Freq: Every day | ORAL | Status: DC
  Administered 2022-03-13: 81 mg via ORAL
  Filled 2022-03-13: qty 1

## 2022-03-13 MED ORDER — CLOPIDOGREL BISULFATE 75 MG PO TABS
75.0000 mg | ORAL_TABLET | Freq: Every day | ORAL | Status: DC
  Administered 2022-03-13: 75 mg via ORAL
  Filled 2022-03-13: qty 1

## 2022-03-13 MED ORDER — IOHEXOL 350 MG/ML SOLN: 75.0000 mL | Freq: Once | INTRAVENOUS | Status: DC | PRN

## 2022-03-13 NOTE — ED Notes (Signed)
Assumed care from [facility]. Pt resting comfortably in bed at this time. Pt denies any current needs or questions. RN advised MD Kennon Rounds that pt passed swallow screen for appropriate diet order.

## 2022-03-13 NOTE — Evaluation (Signed)
Occupational Therapy Evaluation Patient Details Name: Tony Hernandez MRN: [id number] DOB: Jan 23, 1956 Today's Date: 03/13/2022   History of Present Illness 66 y.o. male with small acute R MCA stroke with medical history significant of hypothyroidism, and chronic vertigo who presented with left upper extremity weakness, left hand apraxia and left upper extremity paresthesias.   Clinical Impression   Upon entering the room, pt supine in ED stretcher and agreeable to OT evaluation. Pt is very pleasant and motivated throughout. Pt endorses living at home with wife and working full time. Pt has been ambulating back and forth to bathroom without issue per pt and staff report. Pt with decreased sensation, dexterity, and speed in L UE with testing.  He remains functional but increased time needed to demonstrated tasks such as bilateral coordination tasks of tying shoe laces. OT encouraged pt to utilize L UE as frequently as possible and plans for more formal HEP next session. OT recommends outpt OT once discharged from hospital. All needs within reach.     Recommendations for follow up therapy are one component of a multi-disciplinary discharge planning process, led by the attending physician.  Recommendations may be updated based on patient status, additional functional criteria and insurance authorization.   Follow Up Recommendations  Outpatient OT     Assistance Recommended at Discharge PRN  Patient can return home with the following Assist for transportation    Functional Status Assessment  Patient has had a recent decline in their functional status and demonstrates the ability to make significant improvements in function in a reasonable and predictable amount of time.  Equipment Recommendations  None recommended by OT       Precautions / Restrictions Precautions Precautions: None      Mobility Bed Mobility Overal bed mobility: Independent                  Transfers Overall  transfer level: Independent                        Balance Overall balance assessment: No apparent balance deficits (not formally assessed)                                         ADL either performed or assessed with clinical judgement   ADL Overall ADL's : Modified independent                                       General ADL Comments: increased time to complete tasks secondary to decreased [place] in L UE     Vision Patient Visual Report: No change from baseline              Pertinent Vitals/Pain Pain Assessment Pain Assessment: No/denies pain     Hand Dominance Right   Extremity/Trunk Assessment Upper Extremity Assessment Upper Extremity Assessment: LUE deficits/detail LUE Deficits / Details: 3+/5 LUE Sensation: decreased light touch;decreased proprioception LUE Coordination: decreased fine motor;decreased gross motor   Lower Extremity Assessment Lower Extremity Assessment: Overall WFL for tasks assessed       Communication Communication Communication: No difficulties   Cognition Arousal/Alertness: Awake/alert Behavior During Therapy: WFL for tasks assessed/performed Overall Cognitive Status: Within Functional Limits for tasks assessed  Home Living Family/patient expects to be discharged to:: Private residence Living Arrangements: Spouse/significant other Available Help at Discharge: Family;Available PRN/intermittently Type of Home: Apartment Home Access: Other (comment) Entrance Stairs-Number of Steps: second floor apt - flight to get to front door Entrance Stairs-Rails: Left;Right Home Layout: One level     Bathroom Shower/Tub: Tub/shower unit         Home Equipment: None          Prior Functioning/Environment Prior Level of Function : Independent/Modified Independent;Driving                        OT Problem List:  Decreased coordination;Decreased strength      OT Treatment/Interventions: Therapeutic exercise;Therapeutic activities;Patient/family education    OT Goals(Current goals can be found in the care plan section) Acute Rehab OT Goals Patient Stated Goal: to get L UE working better OT Goal Formulation: With patient Time For Goal Achievement: 03/27/22 Potential to Achieve Goals: Fair  OT Frequency: Min 2X/week       AM-PAC OT "6 Clicks" Daily Activity     Outcome Measure Help from another person eating meals?: None Help from another person taking care of personal grooming?: None Help from another person toileting, which includes using toliet, bedpan, or urinal?: None Help from another person bathing (including washing, rinsing, drying)?: None Help from another person to put on and taking off regular upper body clothing?: None Help from another person to put on and taking off regular lower body clothing?: None 6 Click Score: 24   End of Session    Activity Tolerance: Patient tolerated treatment well Patient left: in bed;with call bell/phone within reach  OT Visit Diagnosis: Muscle weakness (generalized) (M62.81);Other (comment) (coordination disorder)                Time: 8563-1497 OT Time Calculation (min): 15 min Charges:  OT General Charges $OT Visit: 1 Visit OT Evaluation $OT Eval Low Complexity: 1 Low  Darleen Crocker, MS, OTR/L , CBIS ascom [phone]  03/13/22, 1:07 PM

## 2022-03-13 NOTE — ED Notes (Signed)
Pt to US.

## 2022-03-13 NOTE — Hospital Course (Signed)
Patient is a 66 year old with PMH of hypothyroidism and chronic vertigo who presents with left-sided weakness and paresthesias.  He is found to have a right MCA acute.  Neurology consult was done as well as echo and carotid Dopplers which were normal.  A1c and LDL were normal.  Patient was started on aspirin and Plavix and a statin.

## 2022-03-13 NOTE — Consult Note (Signed)
NEUROLOGY CONSULTATION NOTE   Date of service: March 13, 2022 Patient Name: Tony Hernandez MRN:  [id number] DOB:  1955/10/10 Reason for consult: R MCA infarct Requesting physician: Dr. 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  The night prior to admission patient was not feeling well in general and went to bed around 8 PM.  The next morning when he woke up he noticed numbness in his left upper extremity and when he was working he noticed he was unable to use it properly.  His left upper extremity deficits have been improving since the ED and he now only has residual left arm numbness and incoordination.  No other focal neurologic deficits.  Chronic vertigo is unchanged.   Stroke workup this admission:  MRI brain wo contrast  1. Small acute right MCA territory infarct involving the right frontoparietal region. No associated hemorrhage or mass effect. 2. Small remote right cerebellar infarct. 3. Otherwise normal brain MRI for age.  Carotid dopplers no hemodynamically significant plaque  CTA no hemodynamically significant stenoses  TTE normal LVEF, grade I diastolic dysfunction, no intracardiac clot  Stroke Labs     Component Value Date/Time   CHOL 142 03/13/2022 0446   TRIG 96 03/13/2022 0446   HDL 35 (L) 03/13/2022 0446   CHOLHDL 4.1 03/13/2022 0446   VLDL 19 03/13/2022 0446   LDLCALC 88 03/13/2022 0446   LDLCALC 85 04/07/2019 1511    Lab Results  Component Value Date/Time   HGBA1C 6.1 (H) 08/22/2019 04:17 AM      ROS   Per HPI: all other systems reviewed and are negative  Past History   I have reviewed the following:  Past Medical History:   Diagnosis Date   Arthritis    knees   GERD (gastroesophageal reflux disease)    Thyroid disease    Wears dentures    Past Surgical History:  Procedure Laterality Date   CHOLECYSTECTOMY  2018   COLONOSCOPY WITH PROPOFOL N/A 05/19/2019   Procedure: COLONOSCOPY WITH PROPOFOL;  Surgeon: [name], MD;  Location: [place];  Service: Endoscopy;  Laterality: N/A;  Priority 4   POLYPECTOMY  05/19/2019   Procedure: POLYPECTOMY;  Surgeon: [name], MD;  Location: [place];  Service: Endoscopy;;   ROBOT ASSISTED LAPAROSCOPIC RADICAL PROSTATECTOMY Bilateral 08/17/2019   Procedure: XI ROBOTIC ASSISTED LAPAROSCOPIC PROSTATECTOMY, BILATERAL LYMPH NODE DISSECTION;  Surgeon: Hollice Espy, MD;  Location: [facility];  Service: Urology;  Laterality: Bilateral;   Family History  Problem Relation Age of Onset   Lung cancer Mother 31   Diabetes Paternal Grandmother    Colon cancer Sister 17   Social History   Socioeconomic History   Marital status: Married    Spouse name: Not on file   Number of children: Not on file   Years of education: Not on file   Highest education level: Not on file  Occupational History   Not on file  Tobacco Use   Smoking status: Former    Years: 10.00    Types: Cigarettes    Quit date: 06/21/2001    Years  since quitting: 20.7   Smokeless tobacco: Never  Vaping Use   Vaping Use: Never used  Substance and Sexual Activity   Alcohol use: No    Alcohol/week: 0.0 standard drinks of alcohol    Comment: quit 18 years ago   Drug use: Not Currently    Types: Cocaine, Marijuana    Comment: quit 18 years ago    Sexual activity: Yes    Birth control/protection: None  Other Topics Concern   Not on file  Social History Narrative   Single.   2 children.   Works as a Administrator.   Enjoys spending time with family.   Social Determinants of Health   Financial Resource Strain: Not on file  Food Insecurity: No Food Insecurity (03/13/2022)    Hunger Vital Sign    Worried About Running Out of Food in the Last Year: Never true    Ran Out of Food in the Last Year: Never true  Transportation Needs: No Transportation Needs (03/13/2022)   PRAPARE - Hydrologist (Medical): No    Lack of Transportation (Non-Medical): No  Physical Activity: Not on file  Stress: Not on file  Social Connections: Not on file   No Known Allergies  Medications   Medications Prior to Admission  Medication Sig Dispense Refill Last Dose   levothyroxine (SYNTHROID) 175 MCG tablet Take 1 tablet 6 days weekly and 1/2 tablet one day weekly by mouth every morning on an empty stomach with water only.  No food or other medications for 30 minutes. 90 tablet 0 03/12/2022 at 0530   NONFORMULARY OR COMPOUNDED ITEM Trimix (30/1/10)-(Pap/Phent/PGE)  Dosage: Inject 0.3-0.7  cc per injection  Vial 34m  Qty #5 Refills 6  [phone] [phone](Patient not taking: Reported on 03/13/2022) 3 each 0 Not Taking   omeprazole (PRILOSEC) 20 MG capsule Take 1 capsule (20 mg total) by mouth daily. (Patient not taking: Reported on 03/13/2022) 30 capsule 0 Not Taking   sildenafil (REVATIO) 20 MG tablet Take 1-5 tablets (20-100 mg total) by mouth 3 (three) times daily. (Patient not taking: Reported on 03/13/2022) 30 tablet 3 Not Taking      Current Facility-Administered Medications:    acetaminophen (TYLENOL) tablet 650 mg, 650 mg, Oral, Q6H PRN **OR** acetaminophen (TYLENOL) suppository 650 mg, 650 mg, Rectal, Q6H PRN, Arrien, MJimmy Picket MD   aspirin EC tablet 81 mg, 81 mg, Oral, Daily, Arrien, MJimmy Picket MD, 81 mg at 03/13/22 1016   atorvastatin (LIPITOR) tablet 40 mg, 40 mg, Oral, Daily, Arrien, MJimmy Picket MD, 40 mg at 03/13/22 1015   enoxaparin (LOVENOX) injection 40 mg, 40 mg, Subcutaneous, Q24H, Arrien, MJimmy Picket MD, 40 mg at 03/13/22 0117   levothyroxine (SYNTHROID) tablet 175 mcg, 175 mcg,  Oral, Q0600, Arrien, MJimmy Picket MD, 175 mcg at 03/13/22 0556   ondansetron (ZOFRAN) tablet 4 mg, 4 mg, Oral, Q6H PRN **OR** ondansetron (ZOFRAN) injection 4 mg, 4 mg, Intravenous, Q6H PRN, Arrien, MJimmy Picket MD   pantoprazole (PROTONIX) EC tablet 40 mg, 40 mg, Oral, Daily, Arrien, MJimmy Picket MD, 40 mg at 03/13/22 1015  Vitals   Vitals:   03/12/22 2015 03/12/22 2115 03/13/22 0453 03/13/22 0928  BP:  (!) 143/85 109/83 (!) 150/82  Pulse: 83 80 89 70  Resp:  '18 14 16  '$ Temp:  98.3 F (36.8 C) 98.3 F (36.8 C) 97.9 F (36.6 C)  TempSrc:   Oral Oral  SpO2: 100% 100% 98%  98%  Weight:      Height:         Body mass index is 27.46 kg/m.  Physical Exam   Physical Exam Gen: A&O x4, NAD HEENT: Atraumatic, normocephalic;mucous membranes moist; oropharynx clear, tongue without atrophy or fasciculations. Neck: Supple, trachea midline. Resp: CTAB, no w/r/r CV: RRR, no m/g/r; nml S1 and S2. 2+ symmetric peripheral pulses. Abd: soft/NT/ND; nabs x 4 quad Extrem: Nml bulk; no cyanosis, clubbing, or edema.  Neuro: *MS: A&O x4. Follows multi-step commands.  *Speech: fluid, nondysarthric, able to name and repeat *CN:    I: Deferred   II,III: PERRLA, VFF by confrontation, optic discs unable to be visualized 2/2 pupillary constriction   III,IV,VI: EOMI w/o nystagmus, no ptosis   V: Sensation intact from V1 to V3 to LT   VII: Eyelid closure was full.  Smile symmetric.   VIII: Hearing intact to voice   IX,X: Voice normal, palate elevates symmetrically    XI: SCM/trap 5/5 bilat   XII: Tongue protrudes midline, no atrophy or fasciculations   *Motor:   Normal bulk.  No tremor, rigidity or bradykinesia. No pronator drift. 5/5 strength throughout except 4+/5 L delt and tricep. *Sensory: Impaired to LT LUE otherwise intact throughout. No double-simultaneous extinction.  *Coordination:  L ataxia on FNF, LUE dysdiadochokinesia *Reflexes:  2+ and symmetric throughout without clonus;  toes down-going bilat *Gait: deferred  NIHSS = 2 for sensory and LUE ataxia  Premorbid mRS = 1   Labs   CBC:  Recent Labs  Lab 03/12/22 1413 03/12/22 2353  WBC 8.3 8.0  NEUTROABS 4.6  --   HGB 12.6* 12.4*  HCT 40.7 39.5  MCV 77.7* 77.6*  PLT 319 588    Basic Metabolic Panel:  Lab Results  Component Value Date   NA 139 03/13/2022   K 3.9 03/13/2022   CO2 25 03/13/2022   GLUCOSE 92 03/13/2022   BUN 16 03/13/2022   CREATININE 0.99 03/13/2022   CALCIUM 9.3 03/13/2022   GFRNONAA >60 03/13/2022   GFRAA >60 08/30/2019   Lipid Panel:  Lab Results  Component Value Date   LDLCALC 88 03/13/2022   HgbA1c:  Lab Results  Component Value Date   HGBA1C 6.1 (H) 08/22/2019   Urine Drug Screen: No results found for: "LABOPIA", "COCAINSCRNUR", "LABBENZ", "AMPHETMU", "THCU", "LABBARB"  Alcohol Level No results found for: "ETH"   Impression   This is a 66 year old gentleman with past medical history significant for hypothyroidism and 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  Stroke workup is now completed. Given his relative lack of stroke risk factors other than his prior stroke he will need ambulatory cardiac monitoring to r/o occult a fib.  Recommendations   - ASA '81mg'$  daily + plavix '75mg'$  daily x21 days f/b ASA '81mg'$  daily monotherapy after that - Atorvastatin '40mg'$  daily - Ambulatory cardiac monitoring after discharge - I will arrange outpatient neurology f/u - OK to d/c patient  ______________________________________________________________________   Thank you for the opportunity to take part in the care of this patient. If you have any further questions, please contact the neurology consultation attending.  Signed,  Su Monks, MD [facility] [phone]  If 7pm- 7am, please page neurology on call as listed  in [place].  **Any copied and pasted documentation in this note was written by me in another application not billed for  and pasted by me into this document.

## 2022-03-13 NOTE — Discharge Summary (Signed)
Physician Discharge Summary   Patient: Tony Hernandez MRN: [id number] DOB: [birth year]  Admit date:     03/12/2022  Discharge date: 03/13/22  Discharge Physician: Donnamae Jude   PCP: Pleas Koch, NP   Recommendations at discharge:   Repeat TSH Follow-up A1c which has not resulted at time of discharge Zio patch to be mailed to patient with follow-up by cardiology. Outpatient OT Outpatient neurology referral placed.  Discharge Diagnoses: Principal Problem:   Acute CVA (cerebrovascular accident) [facility]) Active Problems:   Hypothyroidism   GERD (gastroesophageal reflux disease)  Hospital Course: Patient is a 66 year old with PMH of hypothyroidism and chronic vertigo who presents with left-sided weakness and paresthesias.  He is found to have a right MCA acute.  Neurology consult was done as well as echo and carotid Dopplers which were normal.  A1c and LDL were normal.  Patient was started on aspirin and Plavix and a statin.  Assessment and Plan: * Acute CVA (cerebrovascular accident) [facility]) Patient with acute CVA in the MCA distribution right frontoparietal and chronic right cerebellar infarct.  Workup includes negative Dopplers, negative echo, negative angiography. Dual antiplatelet therapy for 21 days with Plavix and aspirin followed by 81 mg aspirin and statin therapy Outpatient OT Will be given outpatient cardiac monitoring.  Hypothyroidism Continue with levothyroxine.  Last TSH was significantly suppressed and adjustments were made to his medications.  He will follow-up with his primary care for this.  GERD (gastroesophageal reflux disease) Patient not currently taking PPI we will hold as it can interact with Plavix         Consultants: Neurology Procedures performed: CT angiogram Echo Dopplers Disposition: Home Diet recommendation:  Discharge Diet Orders (From admission, onward)     Start     Ordered   03/13/22 0000  Diet - low sodium heart healthy         03/13/22 1724           Cardiac diet DISCHARGE MEDICATION: Allergies as of 03/13/2022   No Known Allergies      Medication List     STOP taking these medications    omeprazole 20 MG capsule Commonly known as: PRILOSEC       TAKE these medications    aspirin EC 81 MG tablet Take 1 tablet (81 mg total) by mouth daily. Swallow whole. Start taking on: March 14, 2022   atorvastatin 40 MG tablet Commonly known as: LIPITOR Take 1 tablet (40 mg total) by mouth daily. Start taking on: March 14, 2022   clopidogrel 75 MG tablet Commonly known as: PLAVIX Take 1 tablet (75 mg total) by mouth daily for 20 days. Start taking on: March 14, 2022   levothyroxine 175 MCG tablet Commonly known as: SYNTHROID Take 1 tablet 6 days weekly and 1/2 tablet one day weekly by mouth every morning on an empty stomach with water only.  No food or other medications for 30 minutes.   NONFORMULARY OR COMPOUNDED ITEM Trimix (30/1/10)-(Pap/Phent/PGE)  Dosage: Inject 0.3-0.7  cc per injection  Vial 93m  Qty #5 Refills 6  [phone]  sildenafil 20 MG tablet Commonly known as: REVATIO Take 1-5 tablets (20-100 mg total) by mouth 3 (three) times daily.        Follow-up Information     PAnabel Bene MD. Schedule an appointment as soon as possible for a visit in 1 week(s).   Specialty: Neurology Contact information: [place] [phone]  Discharge Exam: Filed Weights   03/12/22 1358  Weight: 74.8 kg   Physical Examination: General appearance - alert, well appearing, and in no distress Chest - clear to auscultation, no wheezes, rales or rhonchi, symmetric air entry Heart - normal rate, regular rhythm, normal S1, S2, no murmurs, rubs, clicks or gallops Abdomen - soft, nontender, nondistended, no masses or organomegaly Neurological -much improved grip strength.   Condition  at discharge: stable  The results of significant diagnostics from this hospitalization (including imaging, microbiology, ancillary and laboratory) are listed below for reference.   Imaging Studies: ECHOCARDIOGRAM COMPLETE  Result Date: 03/13/2022    ECHOCARDIOGRAM REPORT   Patient Name:   Tony Hernandez Date of Exam: 03/13/2022 Medical Rec #:  [id number]    Height:       65.0 in Accession #:    [id number]   Weight:       165.0 lb Date of Birth:  [birth year]     BSA:          1.823 m Patient Age:    61 years     BP:           158/82 mmHg Patient Gender: M            HR:           77 bpm. Exam Location:  [facility] Procedure: 2D Echo, Cardiac Doppler and Color Doppler Indications:     TIA  History:         Patient has no prior history of Echocardiogram examinations.                  Risk Factors:Former Smoker.  Sonographer:     Eartha Inch Referring Phys:  [id number] [place] Diagnosing Phys: Ida Rogue MD  Sonographer Comments: Suboptimal parasternal window. Image acquisition challenging due to patient body habitus and Image acquisition challenging due to respiratory motion. IMPRESSIONS  1. Left ventricular ejection fraction, by estimation, is 55 to 60%. The left ventricle has normal function. The left ventricle has no regional wall motion abnormalities. Left ventricular diastolic parameters are consistent with Grade I diastolic dysfunction (impaired relaxation).  2. Right ventricular systolic function is normal. The right ventricular size is normal.  3. The mitral valve is normal in structure. Mild mitral valve regurgitation. No evidence of mitral stenosis.  4. The aortic valve is normal in structure. Aortic valve regurgitation is not visualized. Aortic valve sclerosis is present, with no evidence of aortic valve stenosis.  5. The inferior vena cava is normal in size with greater than 50% respiratory variability, suggesting right atrial pressure of 3 mmHg. FINDINGS  Left Ventricle: Left ventricular  ejection fraction, by estimation, is 55 to 60%. The left ventricle has normal function. The left ventricle has no regional wall motion abnormalities. The left ventricular internal cavity size was normal in size. There is  no left ventricular hypertrophy. Left ventricular diastolic parameters are consistent with Grade I diastolic dysfunction (impaired relaxation). Right Ventricle: The right ventricular size is normal. No increase in right ventricular wall thickness. Right ventricular systolic function is normal. Left Atrium: Left atrial size was normal in size. Right Atrium: Right atrial size was normal in size. Pericardium: There is no evidence of pericardial effusion. Mitral Valve: The mitral valve is normal in structure. There is mild thickening of the mitral valve leaflet(s). There is mild calcification of the mitral valve leaflet(s). Mild mitral valve regurgitation. No evidence of mitral valve stenosis. Tricuspid Valve: The tricuspid valve is  normal in structure. Tricuspid valve regurgitation is mild . No evidence of tricuspid stenosis. Aortic Valve: The aortic valve is normal in structure. Aortic valve regurgitation is not visualized. Aortic valve sclerosis is present, with no evidence of aortic valve stenosis. Pulmonic Valve: The pulmonic valve was normal in structure. Pulmonic valve regurgitation is not visualized. No evidence of pulmonic stenosis. Aorta: The aortic root is normal in size and structure. Venous: The inferior vena cava is normal in size with greater than 50% respiratory variability, suggesting right atrial pressure of 3 mmHg. IAS/Shunts: No atrial level shunt detected by color flow Doppler.  LEFT VENTRICLE PLAX 2D LVIDd:         4.60 cm     Diastology LVIDs:         3.80 cm     LV e' medial:    7.51 cm/s LV PW:         0.80 cm     LV E/e' medial:  7.9 LV IVS:        0.80 cm     LV e' lateral:   9.79 cm/s LVOT diam:     2.00 cm     LV E/e' lateral: 6.1 LV SV:         76 LV SV Index:   42 LVOT  Area:     3.14 cm  LV Volumes (MOD) LV vol d, MOD A2C: 70.6 ml LV vol d, MOD A4C: 99.6 ml LV vol s, MOD A2C: 24.6 ml LV vol s, MOD A4C: 35.7 ml LV SV MOD A2C:     46.0 ml LV SV MOD A4C:     99.6 ml LV SV MOD BP:      53.4 ml RIGHT VENTRICLE             IVC RV S prime:     12.00 cm/s  IVC diam: 1.00 cm TAPSE (M-mode): 2.4 cm LEFT ATRIUM             Index        RIGHT ATRIUM           Index LA diam:        3.60 cm 1.97 cm/m   RA Area:     14.80 cm LA Vol (A2C):   49.2 ml 26.99 ml/m  RA Volume:   36.90 ml  20.24 ml/m LA Vol (A4C):   44.1 ml 24.19 ml/m LA Biplane Vol: 47.1 ml 25.84 ml/m  AORTIC VALVE LVOT Vmax:   121.00 cm/s LVOT Vmean:  74.800 cm/s LVOT VTI:    0.241 m  AORTA Ao Root diam: 3.30 cm MITRAL VALVE MV Area (PHT): 2.35 cm    SHUNTS MV Decel Time: 323 msec    Systemic VTI:  0.24 m MV E velocity: 59.70 cm/s  Systemic Diam: 2.00 cm MV A velocity: 61.80 cm/s MV E/A ratio:  0.97 Ida Rogue MD Electronically signed by Ida Rogue MD Signature Date/Time: 03/13/2022/4:50:28 PM    Final    CT ANGIO HEAD W OR WO CONTRAST  Result Date: 03/13/2022 CLINICAL DATA:  Neuro deficit with stroke suspected EXAM: CT ANGIOGRAPHY HEAD TECHNIQUE: Multidetector CT imaging of the head was performed using the standard protocol during bolus administration of intravenous contrast. Multiplanar CT image reconstructions and MIPs were obtained to evaluate the vascular anatomy. RADIATION DOSE REDUCTION: This exam was performed according to the departmental dose-optimization program which includes automated exposure control, adjustment of the mA and/or kV according to patient size and/or use of iterative  reconstruction technique. CONTRAST:  40m OMNIPAQUE IOHEXOL 350 MG/ML SOLN COMPARISON:  Brain MRI from yesterday FINDINGS: CT HEAD Brain: Underestimate acute infarction along the superior right cerebral convexity. No visible infarct progression or hemorrhage. No hydrocephalus or masslike finding Vascular: See below Skull:  Normal. Negative for fracture or focal lesion. Sinuses: Clear CTA HEAD Anterior circulation: No significant stenosis, proximal occlusion, aneurysm, or vascular malformation. Posterior circulation: No significant stenosis, proximal occlusion, aneurysm, or vascular malformation. Venous sinuses: Unremarkable for arterial phase Anatomic variants: None significant Review of the MIP images confirms the above findings. IMPRESSION: 1. Underestimated right cerebral infarcts compared to preceding MRI. No hemorrhage or visible progression. 2. No arterial findings. Electronically Signed   By: JJorje GuildM.D.   On: 03/13/2022 16:00   [place]otid Bilateral  Result Date: 03/13/2022 CLINICAL DATA:  Acute CVA EXAM: BILATERAL CAROTID DUPLEX ULTRASOUND TECHNIQUE: GPearline Cablesscale imaging, color Doppler and duplex ultrasound were performed of bilateral carotid and vertebral arteries in the neck. COMPARISON:  None Available. FINDINGS: Criteria: Quantification of carotid stenosis is based on velocity parameters that correlate the residual internal carotid diameter with NASCET-based stenosis levels, using the diameter of the distal internal carotid lumen as the denominator for stenosis measurement. The following velocity measurements were obtained: RIGHT ICA: 85/37 cm/sec CCA: 759/56cm/sec SYSTOLIC ICA/CCA RATIO: 1.2 ECA: 68 cm/sec LEFT ICA: 102/45 cm/sec CCA: 838/75cm/sec SYSTOLIC ICA/CCA RATIO: 1.3 ECA: 86 cm/sec RIGHT CAROTID ARTERY: Atheromatous plaque at the bifurcation. RIGHT VERTEBRAL ARTERY: Patent with antegrade flow and normal waveform LEFT CAROTID ARTERY:  Atheromatous plaque at the bifurcation LEFT VERTEBRAL ARTERY: Patent with antegrade flow and normal waveform IMPRESSION: 1. Carotid bifurcation atherosclerotic plaque. No indication of hemodynamically significant stenosis. 2. Normal flow in the vertebral arteries. Electronically Signed   By: JJorje GuildM.D.   On: 03/13/2022 05:56   MR Cervical Spine Wo  Contrast  Result Date: 03/12/2022 CLINICAL DATA:  Initial evaluation for left upper extremity numbness and weakness. EXAM: MRI CERVICAL SPINE WITHOUT CONTRAST TECHNIQUE: Multiplanar, multisequence MR imaging of the cervical spine was performed. No intravenous contrast was administered. COMPARISON:  None Available. FINDINGS: Alignment: Straightening with mild reversal of the normal cervical lordosis. Trace degenerative retrolisthesis of C5 on C6. Vertebrae: Vertebral body height maintained without acute or chronic fracture. Bone marrow signal intensity within normal limits. No worrisome osseous lesions. Discogenic reactive endplate change with marrow edema present about the C4-5 interspace. More mild reactive endplate changes noted at C5-6 and C6-7 as well. Cord: Normal signal and morphology. Posterior Fossa, vertebral arteries, paraspinal tissues: Unremarkable. Disc levels: C2-C3: Small central disc protrusion indents the ventral thecal sac. Mild facet hypertrophy on the left. No significant spinal stenosis. Foramina remain patent. C3-C4: Disc bulge with bilateral uncovertebral spurring, worse on the right. Superimposed tiny central disc protrusion indents the ventral thecal sac. Mild spinal stenosis with minimal cord flattening but no cord signal changes. Mild left with moderate right C4 foraminal stenosis. C4-C5: Degenerative intervertebral disc space narrowing with circumferential disc osteophyte complex. Flattening and partial effacement of the ventral thecal sac. Resultant mild-to-moderate spinal stenosis with moderate bilateral C5 foraminal narrowing. C5-C6: Degenerative intervertebral disc space narrowing with circumferential disc osteophyte complex. Broad posterior component indents and partially effaces the ventral thecal sac. Mild cord flattening without cord signal changes. Moderate spinal stenosis. Moderate bilateral C6 foraminal narrowing. C6-C7: Degenerative intervertebral disc space narrowing with  circumferential disc osteophyte complex. Flattening and partial effacement of the ventral thecal sac. Moderate spinal stenosis with mild cord flattening,  but no cord signal changes. Moderate to severe left with moderate right C7 foraminal stenosis. C7-T1: Negative interspace. Mild facet hypertrophy. No significant canal or foraminal stenosis. IMPRESSION: 1. Multilevel cervical spondylosis with resultant mild to moderate diffuse spinal stenosis at C3-4 through C6-7. 2. Multifactorial degenerative changes with resultant multilevel foraminal narrowing as above. Notable findings include moderate right C4 foraminal stenosis, moderate bilateral C5 and C6 foraminal narrowing, with moderate to severe left and moderate right C7 foraminal stenosis. 3. Discogenic reactive endplate change with marrow edema about the C4-5 interspace. Finding could contribute to underlying neck pain. Electronically Signed   By: Jeannine Boga M.D.   On: 03/12/2022 22:44   MR BRAIN WO CONTRAST  Result Date: 03/12/2022 CLINICAL DATA:  Initial evaluation for neuro deficit, stroke suspected. EXAM: MRI HEAD WITHOUT CONTRAST TECHNIQUE: Multiplanar, multiecho pulse sequences of the brain and surrounding structures were obtained without intravenous contrast. COMPARISON:  CT from earlier the same day. FINDINGS: Brain: Cerebral volume within normal limits. Minimal hazy FLAIR signal intensity noted involving the periventricular white matter, most likely related chronic microvascular ischemic disease, less than expected for age. Small remote right cerebellar infarct noted. Patchy restricted diffusion involving the cortical and subcortical aspect of the right frontoparietal region, consistent with a small acute right MCA territory infarct. No associated hemorrhage or mass effect. No other evidence for acute or subacute ischemia. Gray-white matter differentiation otherwise maintained. No other areas of chronic cortical infarction. No acute or  chronic intracranial blood products. No mass lesion, midline shift or mass effect. No hydrocephalus or extra-axial fluid collection. Pituitary gland within normal limits. Vascular: Major intracranial vascular flow voids are maintained. Skull and upper cervical spine: Craniocervical junction within normal limits. Bone marrow signal intensity normal. No scalp soft tissue abnormality. Sinuses/Orbits: Globes orbital soft tissues within normal limits. Mild scattered mucosal thickening noted throughout the paranasal sinuses. Moderate right mastoid effusion noted. Visualized nasopharynx unremarkable. Other: None. IMPRESSION: 1. Small acute right MCA territory infarct involving the right frontoparietal region. No associated hemorrhage or mass effect. 2. Small remote right cerebellar infarct. 3. Otherwise normal brain MRI for age. Electronically Signed   By: Jeannine Boga M.D.   On: 03/12/2022 22:39   CT HEAD WO CONTRAST  Result Date: 03/12/2022 CLINICAL DATA:  Stroke suspected EXAM: CT HEAD WITHOUT CONTRAST TECHNIQUE: Contiguous axial images were obtained from the base of the skull through the vertex without intravenous contrast. RADIATION DOSE REDUCTION: This exam was performed according to the departmental dose-optimization program which includes automated exposure control, adjustment of the mA and/or kV according to patient size and/or use of iterative reconstruction technique. COMPARISON:  None Available. FINDINGS: Brain: No evidence of acute infarction, hemorrhage, hydrocephalus, extra-axial collection or mass lesion/mass effect. Vascular: No hyperdense vessel or unexpected calcification. Skull: Normal. Negative for fracture or focal lesion. Sinuses/Orbits: No acute finding. Other: None. IMPRESSION: No hemorrhage or CT evidence of an acute infarct. Electronically Signed   By: Marin Roberts M.D.   On: 03/12/2022 15:02    Microbiology: Results for orders placed or performed during the hospital encounter of  08/15/19  SARS CORONAVIRUS 2 (TAT 6-24 HRS) Nasopharyngeal Nasopharyngeal Swab     Status: None   Collection Time: 08/15/19 10:37 AM   Specimen: Nasopharyngeal Swab  Result Value Ref Range Status   SARS Coronavirus 2 NEGATIVE NEGATIVE Final    Comment: (NOTE) SARS-CoV-2 target nucleic acids are NOT DETECTED. The SARS-CoV-2 RNA is generally detectable in upper and lower respiratory specimens during the acute phase of  infection. Negative results do not preclude SARS-CoV-2 infection, do not rule out co-infections with other pathogens, and should not be used as the sole basis for treatment or other patient management decisions. Negative results must be combined with clinical observations, patient history, and epidemiological information. The expected result is Negative. Fact Sheet for Patients: SugarRoll.be Fact Sheet for Healthcare Providers: https://www.woods-mathews.com/ This test is not yet approved or cleared by the [place] FDA and  has been authorized for detection and/or diagnosis of SARS-CoV-2 by FDA under an Emergency Use Authorization (EUA). This EUA will remain  in effect (meaning this test can be used) for the duration of the COVID-19 declaration under Section 56 4(b)(1) of the Act, 21 U.S.C. section 360bbb-3(b)(1), unless the authorization is terminated or revoked sooner. Performed at [facility], [place] [address], [place], [place] [address]     Labs: CBC: Recent Labs  Lab 03/12/22 1413 03/12/22 2353  WBC 8.3 8.0  NEUTROABS 4.6  --   HGB 12.6* 12.4*  HCT 40.7 39.5  MCV 77.7* 77.6*  PLT 319 841   Basic Metabolic Panel: Recent Labs  Lab 03/12/22 1413 03/12/22 2353 03/13/22 0446  NA 138 137 139  K 3.7 3.7 3.9  CL 107 107 108  CO2 '23 23 25  '$ GLUCOSE 108* 101* 92  BUN '13 13 16  '$ CREATININE 0.84 0.85  0.90 0.99  CALCIUM 9.2 9.4 9.3   Liver Function Tests: Recent Labs  Lab 03/12/22 1413  AST 16  ALT  18  ALKPHOS 62  BILITOT 0.5  PROT 7.2  ALBUMIN 4.1   CBG: No results for input(s): "GLUCAP" in the last 168 hours.  Discharge time spent: greater than 30 minutes.  Signed: Donnamae Jude, MD [facility] 03/13/2022

## 2022-03-13 NOTE — Evaluation (Signed)
Physical Therapy Evaluation & Discharge Patient Details Name: Tony Hernandez MRN: [id number] DOB: [birth year] Today's Date: 03/13/2022  History of Present Illness  66 y.o. male with small acute R MCA stroke with medical history significant of hypothyroidism, and chronic vertigo who presented with left upper extremity weakness, left hand apraxia and left upper extremity paresthesias.  Clinical Impression  Patient admitted with the above. Entered room with patient eager to mobilize longer distances and stair negotiation. Completed DGI with patient scoring 24/24 with no balance deficits noted. Negotiated stairs with alternating pattern and minimal use of L handrail. Patient with no mobility deficits but patient wanting PT to evaluate after stroke diagnosis. No further skilled PT needs identified acutely. No PT follow up recommended at this time. PT will sign off.        Recommendations for follow up therapy are one component of a multi-disciplinary discharge planning process, led by the attending physician.  Recommendations may be updated based on patient status, additional functional criteria and insurance authorization.  Follow Up Recommendations No PT follow up      Assistance Recommended at Discharge None  Patient can return home with the following       Equipment Recommendations None recommended by PT  Recommendations for Other Services       Functional Status Assessment Patient has not had a recent decline in their functional status     Precautions / Restrictions Precautions Precautions: None Restrictions Weight Bearing Restrictions: No      Mobility  Bed Mobility Overal bed mobility: Independent                  Transfers Overall transfer level: Independent                      Ambulation/Gait Ambulation/Gait assistance: Independent Gait Distance (Feet): 200 Feet Assistive device: None Gait Pattern/deviations: WFL(Within Functional Limits)   Gait  velocity interpretation: >4.37 ft/sec, indicative of normal walking speed      Stairs Stairs: Yes Stairs assistance: Independent Stair Management: One rail Left, Alternating pattern, Forwards Number of Stairs: 10    Wheelchair Mobility    Modified Rankin (Stroke Patients Only)       Balance Overall balance assessment: Independent                               Standardized Balance Assessment Standardized Balance Assessment : Dynamic Gait Index   Dynamic Gait Index Level Surface: Normal Change in Gait Speed: Normal Gait with Horizontal Head Turns: Normal Gait with Vertical Head Turns: Normal Gait and Pivot Turn: Normal Step Over Obstacle: Normal Step Around Obstacles: Normal Steps: Normal Total Score: 24       Pertinent Vitals/Pain Pain Assessment Pain Assessment: No/denies pain    Home Living Family/patient expects to be discharged to:: Private residence Living Arrangements: Spouse/significant other Available Help at Discharge: Family;Available PRN/intermittently Type of Home: Apartment Home Access: Other (comment) Entrance Stairs-Rails: Left;Right Entrance Stairs-Number of Steps: second floor apt - flight to get to front door   Home Layout: One level Home Equipment: None      Prior Function Prior Level of Function : Independent/Modified Independent;Driving                     Hand Dominance   Dominant Hand: Right    Extremity/Trunk Assessment   Upper Extremity Assessment Upper Extremity Assessment: Defer to OT evaluation LUE Deficits /  Details: 3+/5 LUE Sensation: decreased light touch;decreased proprioception LUE Coordination: decreased fine motor;decreased gross motor    Lower Extremity Assessment Lower Extremity Assessment: Overall WFL for tasks assessed    Cervical / Trunk Assessment Cervical / Trunk Assessment: Normal  Communication   Communication: No difficulties  Cognition Arousal/Alertness:  Awake/alert Behavior During Therapy: WFL for tasks assessed/performed Overall Cognitive Status: Within Functional Limits for tasks assessed                                          General Comments      Exercises     Assessment/Plan    PT Assessment Patient does not need any further PT services  PT Problem List         PT Treatment Interventions      PT Goals (Current goals can be found in the Care Plan section)  Acute Rehab PT Goals Patient Stated Goal: to get my L arm moving better PT Goal Formulation: All assessment and education complete, DC therapy    Frequency       Co-evaluation               AM-PAC PT "6 Clicks" Mobility  Outcome Measure Help needed turning from your back to your side while in a flat bed without using bedrails?: None Help needed moving from lying on your back to sitting on the side of a flat bed without using bedrails?: None Help needed moving to and from a bed to a chair (including a wheelchair)?: None Help needed standing up from a chair using your arms (e.g., wheelchair or bedside chair)?: None Help needed to walk in hospital room?: None Help needed climbing 3-5 steps with a railing? : None 6 Click Score: 24    End of Session   Activity Tolerance: Patient tolerated treatment well Patient left: Other (comment) (with transport for CTA) Nurse Communication: Mobility status PT Visit Diagnosis: Muscle weakness (generalized) (M62.81)    Time: 8413-2440 PT Time Calculation (min) (ACUTE ONLY): 10 min   Charges:   PT Evaluation $PT Eval Low Complexity: 1 Low          Kaiel Weide A. Gilford Rile PT, DPT [facility] - Acute Rehabilitation Services   Kalei Meda A Lavalle Skoda 03/13/2022, 1:43 PM

## 2022-03-13 NOTE — Discharge Instructions (Signed)
   Heart Monitor:   Length of Wear: 14 days   Your monitor will be mailed to your home address within 3-5 business days. However, if you have not received your monitor after 5 business days please send [place] a MyChart message or call the office at [phone], so we may follow up on this for you.    Your physician has recommended that you wear a Zio XT monitor.    This monitor is a medical device that records the heart's electrical activity. Doctors most often use these monitors to diagnose arrhythmias. Arrhythmias are problems with the speed or rhythm of the heartbeat. The monitor is a small device applied to your chest. You can wear one while you do your normal daily activities. While wearing this monitor if you have any symptoms to push the button and record what you felt. Once you have worn this monitor for the period of time provider prescribed (Usually 14 days), you will return the monitor device in the postage paid box. Once it is returned they will download the data collected and provide [place] with a report which the provider will then review and we will call you with those results. Important tips:   1. Avoid showering during the first 24 hours of wearing the monitor. 2. Avoid excessive sweating to help maximize wear time. 3. Do not submerge the device, no hot tubs, and no swimming pools. 4. Keep any lotions or oils away from the patch. 5. After 24 hours you may shower with the patch on. Take brief showers with your back facing the shower head.  6. Do not remove patch once it has been placed because that will interrupt data and decrease adhesive wear time. 7. Push the button when you have any symptoms and write down what you were feeling. 8. Once you have completed wearing your monitor, remove and place into box which has postage paid and place in your outgoing mailbox.  9. If for some reason you have misplaced your box then call our office and we can provide another box and/or mail it off  for you.

## 2022-03-13 NOTE — Evaluation (Signed)
Speech Language Pathology Evaluation Patient Details Name: Tony Hernandez MRN: [id number] DOB: 1955-04-26 Today's Date: 03/13/2022 Time: 7939-0300 SLP Time Calculation (min) (ACUTE ONLY): 40 min  Problem List:  Patient Active Problem List   Diagnosis Date Noted   Acute CVA (cerebrovascular accident) ([place]) 03/12/2022   Microscopic hematuria 09/03/2021   Generalized abdominal pain 06/19/2021   History of prostate cancer 06/19/2021   Chronic pain of both shoulders 10/08/2020   Chronic pain of left knee 01/22/2020   S/P right colectomy 08/17/2019   History of elevated PSA 04/07/2019   Preventative health care 04/07/2019   Hypothyroidism 05/16/2018   GERD (gastroesophageal reflux disease) 05/16/2018   Past Medical History:  Past Medical History:  Diagnosis Date   Arthritis    knees   GERD (gastroesophageal reflux disease)    Thyroid disease    Wears dentures    Past Surgical History:  Past Surgical History:  Procedure Laterality Date   CHOLECYSTECTOMY  2018   COLONOSCOPY WITH PROPOFOL N/A 05/19/2019   Procedure: COLONOSCOPY WITH PROPOFOL;  Surgeon: [name], MD;  Location: [place];  Service: Endoscopy;  Laterality: N/A;  Priority 4   POLYPECTOMY  05/19/2019   Procedure: POLYPECTOMY;  Surgeon: [name], MD;  Location: [place];  Service: Endoscopy;;   ROBOT ASSISTED LAPAROSCOPIC RADICAL PROSTATECTOMY Bilateral 08/17/2019   Procedure: XI ROBOTIC ASSISTED LAPAROSCOPIC PROSTATECTOMY, BILATERAL LYMPH NODE DISSECTION;  Surgeon: Hollice Espy, MD;  Location: [facility];  Service: Urology;  Laterality: Bilateral;   HPI:  Pt is a 66 y.o. male with medical history significant of GERD, thyroid dis, hypothyroidism, and chronic vertigo who presented with left upper extremity weakness, left hand apraxia and left upper extremity paresthesias. Yesterday morning when he woke up he noted left upper extremity numbness, during work he noticed he was not able to use his left  hand. He decided to come to the Ed for further evaluation.  Through the course of the day his left upper extremity neurologic deficits seems to be getting better.   Denies any other focal deficit. He denies having this symptoms in the past.   MRI: Small acute right MCA territory infarct involving the right  frontoparietal region. No associated hemorrhage or mass effect.  2. Small remote right cerebellar infarct.   Assessment / Plan / Recommendation Clinical Impression   Pt seen for cognitive-linguistic assessment in setting of new "small acute right MCA territory infarct involving the right frontoparietal region. No associated hemorrhage or mass effect.  2. Small remote right cerebellar infarct", per MRI this admit.  Pt has been seen by Neurology; he endorses LUE numbness and weakness but no other overt deficits.  On RA; afebrile. WBC wnl.   Pt appears to present w/ functional cognitive-linguistic abilities. He is A/Ox4 w/ recall and awareness of onset of issues and their impact to him at work(LUE weakness). Tasks of Attention, Memory, Problem-solving, and Executive Functioning were all [facility] for accuracy; Abstraction and Math Calculations were accurate. Fluency WFL. No overt expressive or receptive aphasia noted; motor-speech abilities were [facility] w/ no dysarthria impacting verbal communication at the word-conversation levels.   Pt stated he feels he is at his Baseline w/ "no change in my communication" abilities. Noted Neurology note/assessment also. Recommend pt f/u w/ PCP post return home IF he feels any change/decline in functional status during ADLs; at work. Pt and Son agreed.   SLP Assessment  SLP Recommendation/Assessment: Patient does not need any further Speech Lanaguage Pathology Services (no overt deficits noted --  rec'd f/u w/ PCP if any needs noted upon return home to his ADLs) SLP Visit Diagnosis: Cognitive communication deficit (R41.841) (no overt deficits noted; at his Baseline per pt)     Recommendations for follow up therapy are one component of a multi-disciplinary discharge planning process, led by the attending physician.  Recommendations may be updated based on patient status, additional functional criteria and insurance authorization.    Follow Up Recommendations  No SLP follow up    Assistance Recommended at Discharge  None  Functional Status Assessment Patient has not had a recent decline in their functional status (per his statement)  Frequency and Duration  (n/a)   (n/a)      SLP Evaluation Cognition  Overall Cognitive Status: Within Functional Limits for tasks assessed Arousal/Alertness: Awake/alert Orientation Level: Oriented X4 Year: 2023 Month: December Day of Week: Correct Attention: Focused;Sustained Focused Attention: Appears intact Sustained Attention: Appears intact Memory: Appears intact Awareness: Appears intact Problem Solving: Appears intact Executive Function: Reasoning;Decision Making Reasoning: Appears intact Decision Making: Appears intact Behaviors:  (n/a) Safety/Judgment: Appears intact Comments: discussed situations around the house w/ a weak LUE(ie hot drinks, heavy objects)       Comprehension  Auditory Comprehension Overall Auditory Comprehension: Appears within functional limits for tasks assessed Yes/No Questions: Within Functional Limits Commands: Within Functional Limits Conversation: Complex Interfering Components:  (n/a) EffectiveTechniques:  (n/a) Visual Recognition/Discrimination Discrimination: Not tested Reading Comprehension Reading Status: Within funtional limits    Expression Expression Primary Mode of Expression: Verbal Verbal Expression Overall Verbal Expression: Appears within functional limits for tasks assessed Initiation: No impairment Automatic Speech:  (wfl) Level of Generative/Spontaneous Verbalization: Conversation Repetition: No impairment Naming: No impairment Pragmatics: No  impairment Interfering Components:  (n/a) Effective Techniques:  (n/a) Non-Verbal Means of Communication: Not applicable Written Expression Dominant Hand: Right Written Expression: Within Functional Limits   Oral / Motor  Oral Motor/Sensory Function Overall Oral Motor/Sensory Function: Within functional limits (Dentures) Motor Speech Overall Motor Speech: Appears within functional limits for tasks assessed Respiration: Within functional limits Phonation: Normal Resonance: Within functional limits Articulation: Within functional limitis Intelligibility: Intelligible Motor Planning: Witnin functional limits             Orinda Kenner, MS, CCC-SLP Speech Language Pathologist Rehab Services; [place] [phone] (ascom) Milly Goggins 03/13/2022, 3:00 PM

## 2022-03-13 NOTE — ED Notes (Signed)
Pt returned from US

## 2022-03-13 NOTE — Progress Notes (Signed)
  Echocardiogram 2D Echocardiogram has been performed.  Tony Hernandez 03/13/2022, 4:14 PM

## 2022-03-25 ENCOUNTER — Encounter: Payer: Self-pay | Admitting: Primary Care

## 2022-03-25 ENCOUNTER — Ambulatory Visit: Payer: 59 | Admitting: Primary Care

## 2022-03-25 VITALS — BP 150/88 | HR 85 | Temp 98.1°F | Ht 65.0 in | Wt 165.0 lb

## 2022-03-25 DIAGNOSIS — R531 Weakness: Secondary | ICD-10-CM | POA: Diagnosis not present

## 2022-03-25 DIAGNOSIS — R7303 Prediabetes: Secondary | ICD-10-CM

## 2022-03-25 DIAGNOSIS — Z8673 Personal history of transient ischemic attack (TIA), and cerebral infarction without residual deficits: Secondary | ICD-10-CM

## 2022-03-25 NOTE — Progress Notes (Signed)
Subjective:    Patient ID: Tony Hernandez, male    DOB: 04/19/1955, 67 y.o.   MRN: [id number]  HPI  Tony Hernandez is a very pleasant 67 y.o. male with a history of hypothyroidism, elevated PSA, prostate cancer, right colectomy, CVA who presents today for hospital follow-up.  He presented to [facility] ED on 03/12/2022 for a 20-hour onset of left upper extremity and hand numbness with decreased grip strength.  He underwent MRI brain which showed small acute right MCA territory infarct involving the right frontoparietal region and small remote right cerebellar infarct.  He was admitted for further stroke management and evaluation.  During his stay in the hospital he was initiated on aspirin 81 mg daily; and clopidogrel 75 mg daily x 21 days.  He completed PT and OT.  An echocardiogram was completed which was negative.  He also underwent carotid Dopplers which were negative for blockage.  CTA was negative for significant stenosis.  His statin therapy was continued at atorvastatin 40 mg daily.  He was discharged home on 03/13/2022 with recommendations for ZIO patch monitor and cardiology follow-up, outpatient occupational therapy, outpatient neurology follow-up.  Since his hospital visit he's feeling much better. His numbness as significantly improved. He has mild numbness to his left upper extremity. He is working on his grip strength at home. He denies changes in speech, dizziness, headaches, right sided symptoms.   He has been out of work since March 12, 2022. 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  He believes he has an appointment scheduled with cardiology later this week.  He has yet to receive the Zio patch monitor from his hospital stay.  Review of Systems  Respiratory:  Negative for shortness of breath.   Cardiovascular:  Negative for chest pain.  Gastrointestinal:  Negative for nausea.  Neurological:  Positive for weakness and numbness.  Negative for dizziness and headaches.         Past Medical History:  Diagnosis Date   Arthritis    knees   GERD (gastroesophageal reflux disease)    Thyroid disease    Wears dentures     Social History   Socioeconomic History   Marital status: Married    Spouse name: Not on file   Number of children: Not on file   Years of education: Not on file   Highest education level: Not on file  Occupational History   Not on file  Tobacco Use   Smoking status: Former    Years: 10.00    Types: Cigarettes    Quit date: 06/21/2001    Years since quitting: 20.7   Smokeless tobacco: Never  Vaping Use   Vaping Use: Never used  Substance and Sexual Activity   Alcohol use: No    Alcohol/week: 0.0 standard drinks of alcohol    Comment: quit 18 years ago   Drug use: Not Currently    Types: Cocaine, Marijuana    Comment: quit 18 years ago    Sexual activity: Yes    Birth control/protection: None  Other Topics Concern   Not on file  Social History Narrative   Single.   2 children.   Works as a Administrator.   Enjoys spending time with family.   Social Determinants of Health   Financial Resource Strain: Not on file  Food Insecurity: No Food Insecurity (03/13/2022)   Hunger Vital Sign    Worried About Running Out  of Food in the Last Year: Never true    [place] in the Last Year: Never true  Transportation Needs: No Transportation Needs (03/13/2022)   PRAPARE - Hydrologist (Medical): No    Lack of Transportation (Non-Medical): No  Physical Activity: Not on file  Stress: Not on file  Social Connections: Not on file  Intimate Partner Violence: Not At Risk (03/13/2022)   Humiliation, Afraid, Rape, and Kick questionnaire    Fear of Current or Ex-Partner: No    Emotionally Abused: No    Physically Abused: No    Sexually Abused: No    Past Surgical History:  Procedure Laterality Date   CHOLECYSTECTOMY  2018   COLONOSCOPY WITH PROPOFOL N/A  05/19/2019   Procedure: COLONOSCOPY WITH PROPOFOL;  Surgeon: [name], MD;  Location: [place];  Service: Endoscopy;  Laterality: N/A;  Priority 4   POLYPECTOMY  05/19/2019   Procedure: POLYPECTOMY;  Surgeon: [name], MD;  Location: [place];  Service: Endoscopy;;   ROBOT ASSISTED LAPAROSCOPIC RADICAL PROSTATECTOMY Bilateral 08/17/2019   Procedure: XI ROBOTIC ASSISTED LAPAROSCOPIC PROSTATECTOMY, BILATERAL LYMPH NODE DISSECTION;  Surgeon: Hollice Espy, MD;  Location: [facility];  Service: Urology;  Laterality: Bilateral;    Family History  Problem Relation Age of Onset   Lung cancer Mother 71   Diabetes Paternal Grandmother    Colon cancer Sister 7    No Known Allergies  Current Outpatient Medications on File Prior to Visit  Medication Sig Dispense Refill   aspirin EC 81 MG tablet Take 1 tablet (81 mg total) by mouth daily. Swallow whole. 30 tablet 12   atorvastatin (LIPITOR) 40 MG tablet Take 1 tablet (40 mg total) by mouth daily. 31 tablet 0   clopidogrel (PLAVIX) 75 MG tablet Take 1 tablet (75 mg total) by mouth daily for 20 days. 20 tablet 0   levothyroxine (SYNTHROID) 175 MCG tablet Take 1 tablet 6 days weekly and 1/2 tablet one day weekly by mouth every morning on an empty stomach with water only.  No food or other medications for 30 minutes. 90 tablet 0   sildenafil (REVATIO) 20 MG tablet Take 1-5 tablets (20-100 mg total) by mouth 3 (three) times daily. 30 tablet 3   NONFORMULARY OR COMPOUNDED ITEM Trimix (30/1/10)-(Pap/Phent/PGE)  Dosage: Inject 0.3-0.7  cc per injection  Vial 36m  Qty #5 Refills [phone] [phone](Patient not taking: Reported on 03/13/2022) 3 each 0   No current facility-administered medications on file prior to visit.    BP (!) 150/88   Pulse 85   Temp 98.1 F (36.7 C) (Temporal)   Ht '5\' 5"'$  (1.651 m)   Wt 165 lb (74.8 kg)   SpO2 99%   BMI 27.46 kg/m  Objective:   Physical  Exam Cardiovascular:     Rate and Rhythm: Normal rate and regular rhythm.  Pulmonary:     Effort: Pulmonary effort is normal.     Breath sounds: Normal breath sounds. No wheezing or rales.  Musculoskeletal:     Cervical back: Neck supple.  Skin:    General: Skin is warm and dry.  Neurological:     Mental Status: He is alert and oriented to person, place, and time.     Coordination: Coordination normal. Finger-Nose-Finger Test normal.     Comments: 5/5 strength to bilateral upper extremities. Equal grip strength to bilateral hands.  Assessment & Plan:   Problem List Items Addressed This Visit       Other   History of CVA (cerebrovascular accident) - Primary    Recent hospitalization.  Hospital notes, labs, imaging reviewed.  Referrals placed for neurology and Occupational Therapy evaluation. Continue clopidogrel 75 mg daily until gone. Continue aspirin 81 mg daily.  Based on today's exam he appears stable and well to return to work full-time.  He will follow-up with cardiology this week to ensure they are okay with him returning. He we will also discussed the Zio patch monitor at this visit.  Return precautions provided.       Relevant Orders   Ambulatory referral to Neurology   Prediabetes    Reviewed recent A1c, stable.  Discussed the importance of a healthy diet and regular exercise in order for weight loss, and to reduce the risk of further co-morbidity.       Other Visit Diagnoses     Left-sided weakness       Relevant Orders   Ambulatory referral to Occupational Therapy          Pleas Koch, NP

## 2022-03-25 NOTE — Assessment & Plan Note (Signed)
Recent hospitalization.  Hospital notes, labs, imaging reviewed.  Referrals placed for neurology and Occupational Therapy evaluation. Continue clopidogrel 75 mg daily until gone. Continue aspirin 81 mg daily.  Based on today's exam he appears stable and well to return to work full-time.  He will follow-up with cardiology this week to ensure they are okay with him returning. He we will also discussed the Zio patch monitor at this visit.  Return precautions provided.

## 2022-03-25 NOTE — Patient Instructions (Signed)
Continue clopidogrel 75 mg daily until gone.  Continue aspirin 81 mg daily.  You will either be contacted via phone regarding your referral to neurology and occupational therapy, or you may receive a letter on your MyChart portal from our referral team with instructions for scheduling an appointment. Please let us know if you have not been contacted by anyone within two weeks.  Return next month for your lab appointment to check the thyroid.  It was a pleasure to see you today!

## 2022-03-25 NOTE — Assessment & Plan Note (Signed)
Reviewed recent A1c, stable.  Discussed the importance of a healthy diet and regular exercise in order for weight loss, and to reduce the risk of further co-morbidity.

## 2022-03-26 ENCOUNTER — Telehealth: Payer: Self-pay | Admitting: Primary Care

## 2022-03-26 ENCOUNTER — Ambulatory Visit: Payer: 59 | Attending: Primary Care

## 2022-03-26 DIAGNOSIS — R278 Other lack of coordination: Secondary | ICD-10-CM | POA: Diagnosis present

## 2022-03-26 DIAGNOSIS — M6281 Muscle weakness (generalized): Secondary | ICD-10-CM | POA: Diagnosis not present

## 2022-03-26 NOTE — Telephone Encounter (Signed)
Patient notified that prescription was sent in 02/24/22. He has not picked this up yet, he will contact pharmacy to pick this up

## 2022-03-26 NOTE — Telephone Encounter (Signed)
Prescription Request  03/26/2022  Is this a "Controlled Substance" medicine? No  LOV: 03/25/2022  What is the name of the medication or equipment? levothyroxine (SYNTHROID) 175 MCG tablet   Have you contacted your pharmacy to request a refill? No   Which pharmacy would you like this sent to?    [place], [place] - [address] [place] [address] Phone: [phone] Fax: [phone]    Patient notified that their request is being sent to the clinical staff for review and that they should receive a response within 2 business days.   Please advise at Mobile [phone] (mobile)

## 2022-03-26 NOTE — Therapy (Signed)
OUTPATIENT OCCUPATIONAL THERAPY NEURO EVALUATION  Patient Name: Tony Hernandez MRN: [id number] DOB:1956/02/16, 67 y.o., male Today's Date: 03/26/2022  PCP: Pleas Koch, NP REFERRING PROVIDER: Dr. Donnamae Jude  END OF SESSION:  OT End of Session - 03/26/22 1333     Visit Number 1    Number of Visits 24    Date for OT Re-Evaluation 06/18/22    Authorization Time Period Reporting period beginning 03/26/22    OT Start Time 1118    OT Stop Time 1205    OT Time Calculation (min) 47 min    Equipment Utilized During Treatment none    Activity Tolerance Patient tolerated treatment well    Behavior During Therapy WFL for tasks assessed/performed             Past Medical History:  Diagnosis Date   Arthritis    knees   GERD (gastroesophageal reflux disease)    Thyroid disease    Wears dentures    Past Surgical History:  Procedure Laterality Date   CHOLECYSTECTOMY  2018   COLONOSCOPY WITH PROPOFOL N/A 05/19/2019   Procedure: COLONOSCOPY WITH PROPOFOL;  Surgeon: [name], MD;  Location: [place];  Service: Endoscopy;  Laterality: N/A;  Priority 4   POLYPECTOMY  05/19/2019   Procedure: POLYPECTOMY;  Surgeon: [name], MD;  Location: [place];  Service: Endoscopy;;   ROBOT ASSISTED LAPAROSCOPIC RADICAL PROSTATECTOMY Bilateral 08/17/2019   Procedure: XI ROBOTIC ASSISTED LAPAROSCOPIC PROSTATECTOMY, BILATERAL LYMPH NODE DISSECTION;  Surgeon: Hollice Espy, MD;  Location: [facility];  Service: Urology;  Laterality: Bilateral;   Patient Active Problem List   Diagnosis Date Noted   Prediabetes 03/25/2022   History of CVA (cerebrovascular accident) 03/12/2022   Microscopic hematuria 09/03/2021   Generalized abdominal pain 06/19/2021   History of prostate cancer 06/19/2021   Chronic pain of both shoulders 10/08/2020   Chronic pain of left knee 01/22/2020   S/P right colectomy 08/17/2019   History of elevated PSA 04/07/2019   Preventative health care  04/07/2019   Hypothyroidism 05/16/2018   GERD (gastroesophageal reflux disease) 05/16/2018    ONSET DATE: 03/12/22  REFERRING DIAG: 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 Pt accompanied by: self  PERTINENT HISTORY: Patient is a 67 year old with PMH of hypothyroidism and chronic vertigo who presents with left-sided weakness and paresthesias. He is found to have a right MCA acute. Neurology consult was done as well as echo and carotid Dopplers which were normal. A1c and LDL were normal. Patient was started on aspirin and Plavix and a statin.   PRECAUTIONS: None  WEIGHT BEARING RESTRICTIONS: No  PAIN:  Are you having pain? No  FALLS: Has patient fallen in last 6 months? No  LIVING ENVIRONMENT: Lives with: lives with their spouse Lives in: 2nd floor apartment Stairs: 1 flight, 1 hand rail Has following equipment at home: None  PLOF: Independent, working full time/overtime a lot.  Pt reports he travels a lot for work.    PATIENT GOALS: strengthen the L arm  OBJECTIVE:   HAND DOMINANCE: Right  ADLs: Overall ADLs: independent  Transfers/ambulation related to ADLs: indep without AD Eating: extra time to cut food Grooming: indep UB Dressing: uses R dominant hand to compensate with clothing fasteners/extra time LB Dressing: Pt  reports he can tie shoes, but loosely Toileting: indep Bathing: indep Tub Shower transfers: indep Equipment: none  IADLs: Shopping: indep Light housekeeping: indep Meal Prep: indep but pt reports that he has to use caution when holding things in his L hand Community mobility: indep without AD Medication management: indep Financial management: indep  MOBILITY STATUS: Independent without AD  POSTURE  COMMENTS:  No Significant postural limitations Sitting balance:  no limitations  FUNCTIONAL OUTCOME MEASURES: FOTO: 59; predicted 73  UPPER EXTREMITY ROM:  BUEs WNL   UPPER EXTREMITY MMT:   RUE 5/5, L shoulder,   MMT Right eval Left eval  Shoulder flexion 5 5  Shoulder abduction 5 5  Shoulder adduction    Shoulder extension    Shoulder internal rotation    Shoulder external rotation    Middle trapezius    Lower trapezius    Elbow flexion 5 4+  Elbow extension 5 4+  Wrist flexion 5 4+  Wrist extension 5 4+  Wrist ulnar deviation    Wrist radial deviation    Wrist pronation    Wrist supination    (Blank rows = not tested)  HAND FUNCTION: Grip strength: Right: 78 lbs; Left: 50 lbs, Lateral pinch: Right: 23 lbs, Left: 21 lbs, and 3 point pinch: Right: 18 lbs, Left: 15 lbs  COORDINATION: Finger Nose Finger test: mild ataxia LUE 9 Hole Peg test: Right: 21  sec; Left: 34 sec  SENSATION: WFL; pt reports some initial sensory impairment in the LUE, but now "feels normal"   EDEMA: none  MUSCLE TONE: LUE WNL  COGNITION: Overall cognitive status: Within functional limits for tasks assessed  VISION: Subjective report: No changes post CVA Baseline vision: Wears glasses for reading only  PERCEPTION: WFL  PRAXIS: Impaired: Motor planning; mild ataxia throughout the LUE  OBSERVATIONS: Mild ataxia noted throughout the LUE, mild weakness in the wrist, moderate weakness in the L hand as compared to the R dominant hand.    TODAY'S TREATMENT:                                                                                                                              DATE: 03/26/22 Evaluation completed.  Objective measures taken and goals established for poc.  Therapeutic Exercise: Issued strong resistant blue theraputty and instructed pt in strengthening and coordination exercises for L hand, including gross grasping, lateral/2 point/3 point pinching, digit abd/add, and digging  coins out of putty.  Able to return demo with intermittent vc for technique to improve quality of movement.  Encouraged completion 5-10 min, 2-3x per day.  Handout issued for putty exercises.  Advised pt to be intentional about using the LUE to reach for items during daily tasks to address GM deficits.  Encouraged grabbing silverware out of dishwasher with L hand, plastic cups, other non-breakable items and returning items to cabinets/drawers with same arm, reaching with LUE to hang items in closet, or using L hand to  reach for clothes in a dresser drawer.  Advised pt on coin manipulation skills, with practice picking up coins from a table, storing in hand, and discarding 1 at a time from palm to fingertips; extra time needed.  Pt verbalized understanding of recommendations provided.    PATIENT EDUCATION: Education details: OT role, goals, poc, HEP Person educated: Patient Education method: Explanation, Verbal cues, and Handouts Education comprehension: verbalized understanding, returned demonstration, verbal cues required, and needs further education  HOME EXERCISE PROGRAM: Blue theraputty; using LUE to reach for non-breakable ADL supplies, coin manipulation skills  GOALS: Goals reviewed with patient? Yes  SHORT TERM GOALS: Target date: 05/07/22  Pt will be indep to perform HEP for increasing LUE strength and coordination. Baseline: Eval: initiated theraputty HEP and advised on reaching activities during ADLs with the LUE; further training needed Goal status: INITIAL  LONG TERM GOALS: Target date: 06/18/22  Pt will increase FOTO score to 65 or better to indicate clinically relevant increase in perceived functional use of the LUE with daily tasks. Baseline: Eval: 59; predicted 73 Goal status: INITIAL  2.  Pt will increase L grip strength by 15 or more lbs to improve ability to hold and carry ADL supplies in the L hand. Baseline: Eval: L grip 50#, R 78#; pt reports L hand fatigues when  holding ADL supplies and often compensates with the opposite hand to avoid dropping items. Goal status: INITIAL  3.  Pt will increase L hand [facility] skills to enable pt to efficiently pick up coins from a table. Baseline: Eval: 9 hole L 34 sec, R 21 sec; difficulty picking up coins from table without sliding to edge Goal status: INITIAL  4.  Pt will tie shoes efficiently and tightly. Baseline: Pt reports he ties his shoes loosely Goal status: INITIAL   ASSESSMENT:  CLINICAL IMPRESSION: Patient is a 67 y.o. male who was seen today for occupational therapy evaluation for LUE weakness post CVA pm 03/13/22. Mild ataxia noted throughout the LUE, mild weakness in the wrist, moderate weakness in the L hand as compared to the R dominant hand.  Pt currently struggling to hold heavier ADL supplies in L hand, difficulty tying shoe laces tightly, has some difficulty picking up coins from a table, and has some mild ataxia noted with GMC/FMC activities.  Pt was provided with HEP today for increasing strength and coordination throughout the LUE.  Pt has follow up with neurologist tomorrow and is hoping to get the ok to return to work on Monday.  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  Pt reports that his job has told him that he would no longer have to worry about heavy lifting at work.  Pt will benefit from continued OT services in the outpatient setting in order to increase strength and coordination throughout the LUE for return to baseline with ADLs and work tasks.         PERFORMANCE DEFICITS: in functional skills including ADLs, IADLs, coordination, dexterity, strength, Fine motor control, Gross motor control, and UE functional use.  IMPAIRMENTS: are limiting patient from ADLs, IADLs, and work.   CO-MORBIDITIES: may have co-morbidities  that affects occupational performance. Patient will benefit from skilled OT to address above impairments and improve overall function.  MODIFICATION  OR ASSISTANCE TO COMPLETE EVALUATION: No modification of tasks or assist necessary to complete an evaluation.  OT OCCUPATIONAL PROFILE AND HISTORY: Problem focused assessment: Including review of records relating to presenting problem.  CLINICAL DECISION MAKING:  Moderate - several treatment options, min-mod task modification necessary  REHAB POTENTIAL: Excellent  EVALUATION COMPLEXITY: Moderate    PLAN:  OT FREQUENCY: 1-2x/week  OT DURATION: 12 weeks  PLANNED INTERVENTIONS: self care/ADL training, therapeutic exercise, therapeutic activity, neuromuscular re-education, patient/family education, coping strategies training, and DME and/or AE instructions  RECOMMENDED OTHER SERVICES: N/A  CONSULTED AND AGREED WITH PLAN OF CARE: Patient  PLAN FOR NEXT SESSION: Review/progress HEP   Leta Speller, MS, OTR/L  Darleene Cleaver, OT 03/26/2022, 1:35 PM

## 2022-03-27 ENCOUNTER — Other Ambulatory Visit: Payer: Self-pay | Admitting: Primary Care

## 2022-03-27 DIAGNOSIS — E039 Hypothyroidism, unspecified: Secondary | ICD-10-CM

## 2022-03-27 MED ORDER — LEVOTHYROXINE SODIUM 175 MCG PO TABS: ORAL_TABLET | ORAL | 0 refills | Status: DC

## 2022-03-27 NOTE — Telephone Encounter (Signed)
Pt called in to know status of RX refill stated it still not at the pharmacy . [phone]

## 2022-03-27 NOTE — Telephone Encounter (Signed)
Patient notified Anda Kraft sent in refill for Synthroid today, 03/27/22 to pharmacy.

## 2022-03-31 ENCOUNTER — Ambulatory Visit: Payer: 59

## 2022-03-31 DIAGNOSIS — M6281 Muscle weakness (generalized): Secondary | ICD-10-CM

## 2022-03-31 DIAGNOSIS — R278 Other lack of coordination: Secondary | ICD-10-CM

## 2022-03-31 NOTE — Therapy (Signed)
OUTPATIENT OCCUPATIONAL THERAPY NEURO TREATMENT  Patient Name: Tony Hernandez MRN: [id number] DOB:1955/12/17, 67 y.o., male Today's Date: 03/31/2022  PCP: Pleas Koch, NP REFERRING PROVIDER: Dr. Donnamae Jude  END OF SESSION:  OT End of Session - 03/31/22 1539     Visit Number 2    Number of Visits 24    Date for OT Re-Evaluation 06/18/22    Authorization Time Period Reporting period beginning 03/26/22    OT Start Time 0825    OT Stop Time 0910    OT Time Calculation (min) 45 min    Equipment Utilized During Treatment none    Activity Tolerance Patient tolerated treatment well    Behavior During Therapy WFL for tasks assessed/performed             Past Medical History:  Diagnosis Date   Arthritis    knees   GERD (gastroesophageal reflux disease)    Thyroid disease    Wears dentures    Past Surgical History:  Procedure Laterality Date   CHOLECYSTECTOMY  2018   COLONOSCOPY WITH PROPOFOL N/A 05/19/2019   Procedure: COLONOSCOPY WITH PROPOFOL;  Surgeon: [name], MD;  Location: [place];  Service: Endoscopy;  Laterality: N/A;  Priority 4   POLYPECTOMY  05/19/2019   Procedure: POLYPECTOMY;  Surgeon: [name], MD;  Location: [place];  Service: Endoscopy;;   ROBOT ASSISTED LAPAROSCOPIC RADICAL PROSTATECTOMY Bilateral 08/17/2019   Procedure: XI ROBOTIC ASSISTED LAPAROSCOPIC PROSTATECTOMY, BILATERAL LYMPH NODE DISSECTION;  Surgeon: Hollice Espy, MD;  Location: [facility];  Service: Urology;  Laterality: Bilateral;   Patient Active Problem List   Diagnosis Date Noted   Prediabetes 03/25/2022   History of CVA (cerebrovascular accident) 03/12/2022   Microscopic hematuria 09/03/2021   Generalized abdominal pain 06/19/2021   History of prostate cancer 06/19/2021   Chronic pain of both shoulders 10/08/2020   Chronic pain of left knee 01/22/2020   S/P right colectomy 08/17/2019   History of elevated PSA 04/07/2019   Preventative health care  04/07/2019   Hypothyroidism 05/16/2018   GERD (gastroesophageal reflux disease) 05/16/2018    ONSET DATE: 03/12/22  REFERRING DIAG: L sided weakness post R MCA and cerebellar infarcts  THERAPY DIAG:  Muscle weakness (generalized)  Other lack of coordination  Rationale for Evaluation and Treatment: Rehabilitation  SUBJECTIVE:   SUBJECTIVE STATEMENT: Pt reports he started back at work yesterday and felt really good about his day.  Pt accompanied by: self  PERTINENT HISTORY: Patient is a 67 year old with PMH of hypothyroidism and chronic vertigo who presents with left-sided weakness and paresthesias. He is found to have a right MCA acute. Neurology consult was done as well as echo and carotid Dopplers which were normal. A1c and LDL were normal. Patient was started on aspirin and Plavix and a statin.   PRECAUTIONS: None  WEIGHT BEARING RESTRICTIONS: No  PAIN:  Are you having pain? No  FALLS: Has patient fallen in last 6 months? No  LIVING ENVIRONMENT: Lives with: lives with their spouse Lives in: 2nd floor apartment Stairs: 1 flight, 1 hand rail Has following equipment at home: None  PLOF: Independent, working full time/overtime a lot.  Pt reports he travels a lot for work.    PATIENT GOALS: strengthen the L arm  OBJECTIVE:   HAND DOMINANCE: Right  ADLs: Overall ADLs: independent  Transfers/ambulation related to ADLs: indep without AD Eating: extra time to cut food Grooming: indep UB Dressing: uses R dominant hand to compensate with clothing fasteners/extra  time LB Dressing: Pt reports he can tie shoes, but loosely Toileting: indep Bathing: indep Tub Shower transfers: indep Equipment: none  IADLs: Shopping: indep Light housekeeping: indep Meal Prep: indep but pt reports that he has to use caution when holding things in his L hand Community mobility: indep without AD Medication management: indep Financial management: indep  MOBILITY STATUS: Independent  without AD  POSTURE COMMENTS:  No Significant postural limitations Sitting balance:  no limitations  FUNCTIONAL OUTCOME MEASURES: FOTO: 59; predicted 73  UPPER EXTREMITY ROM:  BUEs WNL   UPPER EXTREMITY MMT:   RUE 5/5, L shoulder,   MMT Right eval Left eval  Shoulder flexion 5 5  Shoulder abduction 5 5  Shoulder adduction    Shoulder extension    Shoulder internal rotation    Shoulder external rotation    Middle trapezius    Lower trapezius    Elbow flexion 5 4+  Elbow extension 5 4+  Wrist flexion 5 4+  Wrist extension 5 4+  Wrist ulnar deviation    Wrist radial deviation    Wrist pronation    Wrist supination    (Blank rows = not tested)  HAND FUNCTION: Grip strength: Right: 78 lbs; Left: 50 lbs, Lateral pinch: Right: 23 lbs, Left: 21 lbs, and 3 point pinch: Right: 18 lbs, Left: 15 lbs  COORDINATION: Finger Nose Finger test: mild ataxia LUE 9 Hole Peg test: Right: 21  sec; Left: 34 sec  SENSATION: WFL; pt reports some initial sensory impairment in the LUE, but now "feels normal"   EDEMA: none  MUSCLE TONE: LUE WNL  COGNITION: Overall cognitive status: Within functional limits for tasks assessed  VISION: Subjective report: No changes post CVA Baseline vision: Wears glasses for reading only  PERCEPTION: WFL  PRAXIS: Impaired: Motor planning; mild ataxia throughout the LUE  OBSERVATIONS: Mild ataxia noted throughout the LUE, mild weakness in the wrist, moderate weakness in the L hand as compared to the R dominant hand.    TODAY'S TREATMENT:                                                                                                                              DATE: 03/31/22 Therapeutic Exercise: Facilitated hand strengthening with use of hand gripper set at 17.9# for 1 trial and 23.4# for 2 more trials to remove jumbo pegs from pegboard for 3 trials total on the L hand; rest between sets and cues for slowing pace with release to reduce dropped pegs.  Facilitated pinch strengthening with use of therapy resistant clothespins to target lateral and 3 point pinch of L hand.  Pt was able to manage all colors, requiring min vc for prehension patterns.  Facilitated forward and diagonal reaching patterns having pt place and remove clips from vertical and horizontal dowel in a variety of planes at shoulder level.  Pt participated in resistive combination movements with the wrist and hand using [place] board tools.  Pt  completed 3 reps (up/down board=1 rep) rolling the large/long handled tool, 3 reps on the tool which simulated turning a key (large grip side), 3 reps on the tool which simulated turning a large dial, 3 reps with the small dial (min guard to keep tool level while rolling small dial), 3 reps with the small dowel to facilitate digit flex/ext. Pt required rest breaks between each set of 3.    Neuro re-ed: Practiced reaching toward a target with LUE placing jumbo pegs into pegboard.  Facilitated forward and diagonal reaching patterns having pt place and remove pegs with pegboard placed in a variety of planes at shoulder level.  Practiced use of L hand to pick up washers from a magnetic dish and thread washers over vertical dowel placed on table top with fairly good accuracy and minimal compensatory stabilizing at top of dowel.   PATIENT EDUCATION: Education details: FMC/GMC activities with the LUE Person educated: Patient Education method: Explanation, Verbal cues, and Handouts Education comprehension: verbalized understanding, returned demonstration, verbal cues required, and needs further education  HOME EXERCISE PROGRAM: Blue theraputty; using LUE to reach for non-breakable ADL supplies, coin manipulation skills  GOALS: Goals reviewed with patient? Yes  SHORT TERM GOALS: Target date: 05/07/22  Pt will be indep to perform HEP for increasing LUE strength and coordination. Baseline: Eval: initiated theraputty HEP and advised on reaching activities  during ADLs with the LUE; further training needed Goal status: INITIAL  LONG TERM GOALS: Target date: 06/18/22  Pt will increase FOTO score to 65 or better to indicate clinically relevant increase in perceived functional use of the LUE with daily tasks. Baseline: Eval: 59; predicted 73 Goal status: INITIAL  2.  Pt will increase L grip strength by 15 or more lbs to improve ability to hold and carry ADL supplies in the L hand. Baseline: Eval: L grip 50#, R 78#; pt reports L hand fatigues when holding ADL supplies and often compensates with the opposite hand to avoid dropping items. Goal status: INITIAL  3.  Pt will increase L hand [facility] skills to enable pt to efficiently pick up coins from a table. Baseline: Eval: 9 hole L 34 sec, R 21 sec; difficulty picking up coins from table without sliding to edge Goal status: INITIAL  4.  Pt will tie shoes efficiently and tightly. Baseline: Pt reports he ties his shoes loosely Goal status: INITIAL   ASSESSMENT:  CLINICAL IMPRESSION: Pt returned to work on Monday and reported that he felt really good about his work day.  Pt reports consistent use of putty at home and has been diligent to engage the LUE into reaching activities at home as encouraged by OT.  Pt demonstrated fairly good accuracy when reaching towards a target today using the LUE, noting very mild ataxia as compared to eval.  L hand strength is improving, but strength is still below baseline.  Pt will benefit from continued OT services in the outpatient setting in order to increase strength and coordination throughout the LUE for return to baseline with ADLs and work tasks.         PERFORMANCE DEFICITS: in functional skills including ADLs, IADLs, coordination, dexterity, strength, Fine motor control, Gross motor control, and UE functional use.  IMPAIRMENTS: are limiting patient from ADLs, IADLs, and work.   CO-MORBIDITIES: may have co-morbidities  that affects occupational performance.  Patient will benefit from skilled OT to address above impairments and improve overall function.  MODIFICATION OR ASSISTANCE TO COMPLETE EVALUATION: No modification of  tasks or assist necessary to complete an evaluation.  OT OCCUPATIONAL PROFILE AND HISTORY: Problem focused assessment: Including review of records relating to presenting problem.  CLINICAL DECISION MAKING: Moderate - several treatment options, min-mod task modification necessary  REHAB POTENTIAL: Excellent  EVALUATION COMPLEXITY: Moderate    PLAN:  OT FREQUENCY: 1-2x/week  OT DURATION: 12 weeks  PLANNED INTERVENTIONS: self care/ADL training, therapeutic exercise, therapeutic activity, neuromuscular re-education, patient/family education, coping strategies training, and DME and/or AE instructions  RECOMMENDED OTHER SERVICES: N/A  CONSULTED AND AGREED WITH PLAN OF CARE: Patient  PLAN FOR NEXT SESSION: Review/progress HEP   Leta Speller, MS, OTR/L  Darleene Cleaver, OT 03/31/2022, 3:41 PM

## 2022-04-02 ENCOUNTER — Ambulatory Visit: Payer: 59

## 2022-04-07 ENCOUNTER — Ambulatory Visit: Payer: 59

## 2022-04-07 DIAGNOSIS — R278 Other lack of coordination: Secondary | ICD-10-CM

## 2022-04-07 DIAGNOSIS — M6281 Muscle weakness (generalized): Secondary | ICD-10-CM | POA: Diagnosis not present

## 2022-04-07 NOTE — Therapy (Signed)
OUTPATIENT OCCUPATIONAL THERAPY NEURO TREATMENT  Patient Name: Tony Hernandez MRN: [id number] DOB:Nov 25, 1955, 67 y.o., male Today's Date: 04/07/2022  PCP: Pleas Koch, NP REFERRING PROVIDER: Dr. Donnamae Jude  END OF SESSION:  OT End of Session - 04/07/22 0829     Visit Number 3    Number of Visits 24    Date for OT Re-Evaluation 06/18/22    Authorization Time Period Reporting period beginning 03/26/22    OT Start Time 0830    OT Stop Time 0915    OT Time Calculation (min) 45 min    Equipment Utilized During Treatment none    Activity Tolerance Patient tolerated treatment well    Behavior During Therapy WFL for tasks assessed/performed             Past Medical History:  Diagnosis Date   Arthritis    knees   GERD (gastroesophageal reflux disease)    Thyroid disease    Wears dentures    Past Surgical History:  Procedure Laterality Date   CHOLECYSTECTOMY  2018   COLONOSCOPY WITH PROPOFOL N/A 05/19/2019   Procedure: COLONOSCOPY WITH PROPOFOL;  Surgeon: [name], MD;  Location: [place];  Service: Endoscopy;  Laterality: N/A;  Priority 4   POLYPECTOMY  05/19/2019   Procedure: POLYPECTOMY;  Surgeon: [name], MD;  Location: [place];  Service: Endoscopy;;   ROBOT ASSISTED LAPAROSCOPIC RADICAL PROSTATECTOMY Bilateral 08/17/2019   Procedure: XI ROBOTIC ASSISTED LAPAROSCOPIC PROSTATECTOMY, BILATERAL LYMPH NODE DISSECTION;  Surgeon: Hollice Espy, MD;  Location: [facility];  Service: Urology;  Laterality: Bilateral;   Patient Active Problem List   Diagnosis Date Noted   Prediabetes 03/25/2022   History of CVA (cerebrovascular accident) 03/12/2022   Microscopic hematuria 09/03/2021   Generalized abdominal pain 06/19/2021   History of prostate cancer 06/19/2021   Chronic pain of both shoulders 10/08/2020   Chronic pain of left knee 01/22/2020   S/P right colectomy 08/17/2019   History of elevated PSA 04/07/2019   Preventative health care  04/07/2019   Hypothyroidism 05/16/2018   GERD (gastroesophageal reflux disease) 05/16/2018    ONSET DATE: 03/12/22  REFERRING DIAG: 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 Pt accompanied by: self  PERTINENT HISTORY: Patient is a 67 year old with PMH of hypothyroidism and chronic vertigo who presents with left-sided weakness and paresthesias. He is found to have a right MCA acute. Neurology consult was done as well as echo and carotid Dopplers which were normal. A1c and LDL were normal. Patient was started on aspirin and Plavix and a statin.   PRECAUTIONS: None  WEIGHT BEARING RESTRICTIONS: No  PAIN:  Are you having pain? No  FALLS: Has patient fallen in last 6 months? No  LIVING ENVIRONMENT: Lives with: lives with their spouse Lives in: 2nd floor apartment Stairs: 1 flight, 1 hand rail Has following equipment at home: None  PLOF: Independent, working full time/overtime a lot.  Pt reports he travels a lot for work.    PATIENT GOALS: strengthen the L arm  OBJECTIVE:   HAND DOMINANCE: Right  ADLs: Overall ADLs: independent  Transfers/ambulation related to ADLs: indep without AD Eating: extra time to cut food Grooming: indep UB Dressing: uses R dominant hand to compensate with clothing fasteners/extra  time LB Dressing: Pt reports he can tie shoes, but loosely Toileting: indep Bathing: indep Tub Shower transfers: indep Equipment: none  IADLs: Shopping: indep Light housekeeping: indep Meal Prep: indep but pt reports that he has to use caution when holding things in his L hand Community mobility: indep without AD Medication management: indep Financial management: indep  MOBILITY  STATUS: Independent without AD  POSTURE COMMENTS:  No Significant postural limitations Sitting balance:  no limitations  FUNCTIONAL OUTCOME MEASURES: FOTO: 59; predicted 73  UPPER EXTREMITY ROM:  BUEs WNL   UPPER EXTREMITY MMT:   RUE 5/5, L shoulder,   MMT Right eval Left eval  Shoulder flexion 5 5  Shoulder abduction 5 5  Shoulder adduction    Shoulder extension    Shoulder internal rotation    Shoulder external rotation    Middle trapezius    Lower trapezius    Elbow flexion 5 4+  Elbow extension 5 4+  Wrist flexion 5 4+  Wrist extension 5 4+  Wrist ulnar deviation    Wrist radial deviation    Wrist pronation    Wrist supination    (Blank rows = not tested)  HAND FUNCTION: Grip strength: Right: 78 lbs; Left: 50 lbs, Lateral pinch: Right: 23 lbs, Left: 21 lbs, and 3 point pinch: Right: 18 lbs, Left: 15 lbs  COORDINATION: Finger Nose Finger test: mild ataxia LUE 9 Hole Peg test: Right: 21  sec; Left: 34 sec  SENSATION: WFL; pt reports some initial sensory impairment in the LUE, but now "feels normal"   EDEMA: none  MUSCLE TONE: LUE WNL  COGNITION: Overall cognitive status: Within functional limits for tasks assessed  VISION: Subjective report: No changes post CVA Baseline vision: Wears glasses for reading only  PERCEPTION: WFL  PRAXIS: Impaired: Motor planning; mild ataxia throughout the LUE  OBSERVATIONS: Mild ataxia noted throughout the LUE, mild weakness in the wrist, moderate weakness in the L hand as compared to the R dominant hand.    TODAY'S TREATMENT:                                                                                                                              DATE: 03/31/22 Therapeutic Exercise: Facilitated hand strengthening with use of hand gripper set at 23.4# for 3 trials to remove jumbo pegs from pegboard.  Brief rest between sets and min cues for slowing pace with release to reduce dropped pegs.  Pt participated in resistive  combination movements with the wrist and hand using [place] board tools.  Pt completed 3 reps (up/down board=1 rep) rolling the large/long handled tool, 3 reps on the tool which simulated turning a key (large grip side), 3 reps on the tool which simulated turning a large dial, 3 reps with the small dial (min guard to keep tool level while rolling small dial), 3 reps with the small dowel to facilitate digit flex/ext. Pt required brief rest breaks between each set of  3.  Instructed pt in resistive wrist, forearm, and elbow AROM with 5 lb dumbbell.  Pt completed 3 sets 10 reps for wrist flex, ext, RD, forearm pron/sup, and elbow flex.  OT provided min vc for hanging wrist off arm of chair to stabilize forearm during wrist AROM, and for slowing pace to control each direction.  Handout issued for wrist strengthening and reviewed with pt.  Neuro re-ed: Facilitated L FMC and practiced reaching toward a target with LUE picking up washers from magnetic dish and placing over vertical dowels.  Facilitated forward and diagonal reaching patterns to place washers over dowels.  Practiced dexterity skills in the L hand, working to pick up washers from table top 1 at a time, store up to 5 in hand, and moved them from palm to fingertips in prep to discard.  Pt reports he's been practicing these maneuvers at home with pennies.  PATIENT EDUCATION: Education details: FMC/GMC activities with the LUE Person educated: Patient Education method: Explanation, Verbal cues, and Handouts Education comprehension: verbalized understanding, returned demonstration, verbal cues required, and needs further education  HOME EXERCISE PROGRAM: Blue theraputty; using LUE to reach for non-breakable ADL supplies, coin manipulation skills  GOALS: Goals reviewed with patient? Yes  SHORT TERM GOALS: Target date: 05/07/22  Pt will be indep to perform HEP for increasing LUE strength and coordination. Baseline: Eval: initiated theraputty HEP and  advised on reaching activities during ADLs with the LUE; further training needed Goal status: INITIAL  LONG TERM GOALS: Target date: 06/18/22  Pt will increase FOTO score to 65 or better to indicate clinically relevant increase in perceived functional use of the LUE with daily tasks. Baseline: Eval: 59; predicted 73 Goal status: INITIAL  2.  Pt will increase L grip strength by 15 or more lbs to improve ability to hold and carry ADL supplies in the L hand. Baseline: Eval: L grip 50#, R 78#; pt reports L hand fatigues when holding ADL supplies and often compensates with the opposite hand to avoid dropping items. Goal status: INITIAL  3.  Pt will increase L hand [facility] skills to enable pt to efficiently pick up coins from a table. Baseline: Eval: 9 hole L 34 sec, R 21 sec; difficulty picking up coins from table without sliding to edge Goal status: INITIAL  4.  Pt will tie shoes efficiently and tightly. Baseline: Pt reports he ties his shoes loosely Goal status: INITIAL   ASSESSMENT:  CLINICAL IMPRESSION: Pt continues to progress well with LUE strength and coordination.  OT instructed pt in wrist, forearm, and elbow resistive exercises this date with a 5 lb dumbbell.  Pt demonstrated good tolerance, requiring min vc for technique.  Handout was issued to allow for carryover at home.  Pt demonstrated improved accuracy when using L hand to reach toward a target.  Pt demonstrated greater ease with picking up washers from dish and table top, storing in hand, and translatory skills moving washers from palm to fingertips without dropping.  L hand strength is improving, but strength is still below baseline.  Pt will benefit from continued OT services in the outpatient setting in order to increase strength and coordination throughout the LUE for return to baseline with ADLs and work tasks.         PERFORMANCE DEFICITS: in functional skills including ADLs, IADLs, coordination, dexterity, strength, Fine  motor control, Gross motor control, and UE functional use.  IMPAIRMENTS: are limiting patient from ADLs, IADLs, and work.   CO-MORBIDITIES: may have  co-morbidities  that affects occupational performance. Patient will benefit from skilled OT to address above impairments and improve overall function.  MODIFICATION OR ASSISTANCE TO COMPLETE EVALUATION: No modification of tasks or assist necessary to complete an evaluation.  OT OCCUPATIONAL PROFILE AND HISTORY: Problem focused assessment: Including review of records relating to presenting problem.  CLINICAL DECISION MAKING: Moderate - several treatment options, min-mod task modification necessary  REHAB POTENTIAL: Excellent  EVALUATION COMPLEXITY: Moderate    PLAN:  OT FREQUENCY: 1-2x/week  OT DURATION: 12 weeks  PLANNED INTERVENTIONS: self care/ADL training, therapeutic exercise, therapeutic activity, neuromuscular re-education, patient/family education, coping strategies training, and DME and/or AE instructions  RECOMMENDED OTHER SERVICES: N/A  CONSULTED AND AGREED WITH PLAN OF CARE: Patient  PLAN FOR NEXT SESSION: Review/progress HEP   Leta Speller, MS, OTR/L  Darleene Cleaver, OT 04/07/2022, 5:10 PM

## 2022-04-09 ENCOUNTER — Ambulatory Visit: Payer: 59

## 2022-04-13 ENCOUNTER — Other Ambulatory Visit: Payer: 59

## 2022-04-13 DIAGNOSIS — E782 Mixed hyperlipidemia: Secondary | ICD-10-CM | POA: Insufficient documentation

## 2022-04-14 ENCOUNTER — Ambulatory Visit: Payer: 59

## 2022-04-14 DIAGNOSIS — M6281 Muscle weakness (generalized): Secondary | ICD-10-CM | POA: Diagnosis not present

## 2022-04-14 DIAGNOSIS — R278 Other lack of coordination: Secondary | ICD-10-CM

## 2022-04-14 NOTE — Therapy (Signed)
OUTPATIENT OCCUPATIONAL THERAPY NEURO DISCHARGE   Patient Name: Tony Hernandez MRN: [id number] DOB:21-Dec-1955, 67 y.o., male Today's Date: 04/14/2022  PCP: Pleas Koch, NP REFERRING PROVIDER: Dr. Donnamae Jude  END OF SESSION:  OT End of Session - 04/14/22 0828     Visit Number 4    Number of Visits 24    Date for OT Re-Evaluation 06/18/22    Authorization Time Period Reporting period beginning 03/26/22    OT Start Time 0830    OT Stop Time 0857    OT Time Calculation (min) 27 min    Equipment Utilized During Treatment none    Activity Tolerance Patient tolerated treatment well    Behavior During Therapy WFL for tasks assessed/performed             Past Medical History:  Diagnosis Date   Arthritis    knees   GERD (gastroesophageal reflux disease)    Thyroid disease    Wears dentures    Past Surgical History:  Procedure Laterality Date   CHOLECYSTECTOMY  2018   COLONOSCOPY WITH PROPOFOL N/A 05/19/2019   Procedure: COLONOSCOPY WITH PROPOFOL;  Surgeon: [name], MD;  Location: [place];  Service: Endoscopy;  Laterality: N/A;  Priority 4   POLYPECTOMY  05/19/2019   Procedure: POLYPECTOMY;  Surgeon: [name], MD;  Location: [place];  Service: Endoscopy;;   ROBOT ASSISTED LAPAROSCOPIC RADICAL PROSTATECTOMY Bilateral 08/17/2019   Procedure: XI ROBOTIC ASSISTED LAPAROSCOPIC PROSTATECTOMY, BILATERAL LYMPH NODE DISSECTION;  Surgeon: Hollice Espy, MD;  Location: [facility];  Service: Urology;  Laterality: Bilateral;   Patient Active Problem List   Diagnosis Date Noted   Prediabetes 03/25/2022   History of CVA (cerebrovascular accident) 03/12/2022   Microscopic hematuria 09/03/2021   Generalized abdominal pain 06/19/2021   History of prostate cancer 06/19/2021   Chronic pain of both shoulders 10/08/2020   Chronic pain of left knee 01/22/2020   S/P right colectomy 08/17/2019   History of elevated PSA 04/07/2019   Preventative health care  04/07/2019   Hypothyroidism 05/16/2018   GERD (gastroesophageal reflux disease) 05/16/2018    ONSET DATE: 03/12/22  REFERRING DIAG: 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  Pt continues to perform HEP consistently at home. Pt accompanied by: self  PERTINENT HISTORY: Patient is a 67 year old with PMH of hypothyroidism and chronic vertigo who presents with left-sided weakness and paresthesias. He is found to have a right MCA acute. Neurology consult was done as well as echo and carotid Dopplers which were normal. A1c and LDL were normal. Patient was started on aspirin and Plavix and a statin.   PRECAUTIONS: None  WEIGHT BEARING RESTRICTIONS: No  PAIN:  Are you having pain? No  FALLS: Has patient fallen in last 6 months? No  LIVING ENVIRONMENT: Lives with: lives with their spouse Lives in: 2nd floor apartment Stairs: 1 flight, 1 hand rail Has following equipment at home: None  PLOF: Independent, working full time/overtime a lot.  Pt reports he travels a lot for work.    PATIENT GOALS: strengthen the L arm  OBJECTIVE:   HAND DOMINANCE: Right  ADLs: (Eval) Overall ADLs: independent  Transfers/ambulation related to ADLs: indep without AD Eating: extra time to cut food Grooming: indep UB Dressing: uses R dominant  hand to compensate with clothing fasteners/extra time LB Dressing: Pt reports he can tie shoes, but loosely Toileting: indep Bathing: indep Tub Shower transfers: indep Equipment: none  IADLs: Shopping: indep Light housekeeping: indep Meal Prep: indep but pt reports that he has to use caution when holding things in his L hand Community mobility: indep without AD Medication management: indep Financial management:  indep  MOBILITY STATUS: Independent without AD  POSTURE COMMENTS:  No Significant postural limitations Sitting balance:  no limitations  FUNCTIONAL OUTCOME MEASURES: FOTO: 59; predicted 73 ; 04/14/22: d/c FOTO: 77  UPPER EXTREMITY ROM:  BUEs WNL   UPPER EXTREMITY MMT:   RUE 5/5, L shoulder,   MMT Right eval Left eval Left 04/14/22  Shoulder flexion '5 5 5  '$ Shoulder abduction '5 5 5  '$ Shoulder adduction     Shoulder extension     Shoulder internal rotation     Shoulder external rotation     Middle trapezius     Lower trapezius     Elbow flexion 5 4+ 5  Elbow extension 5 4+ 5  Wrist flexion 5 4+ 5  Wrist extension 5 4+ 5  Wrist ulnar deviation     Wrist radial deviation     Wrist pronation     Wrist supination     (Blank rows = not tested)  HAND FUNCTION: Eval: Grip strength: Right: 78 lbs; Left: 50 lbs, Lateral pinch: Right: 23 lbs, Left: 21 lbs, and 3 point pinch: Right: 18 lbs, Left: 15 lbs 04/14/22: Grip strength: Right: 84 lbs; Left: 69 lbs, Lateral pinch: Right: 25 lbs, Left: 22 lbs, and 3 point pinch: Right: 22 lbs, Left: 20 lbs  COORDINATION: Eval: Finger Nose Finger test: mild ataxia LUE 9 Hole Peg test: Right: 21  sec; Left: 34 sec 04/14/22: Finger Nose Finger test: WNL BUEs 9 Hole Peg test: Right: 21  sec; Left: 29 sec  SENSATION: WFL; pt reports some initial sensory impairment in the LUE, but now "feels normal"   EDEMA: none  MUSCLE TONE: LUE WNL  COGNITION: Overall cognitive status: Within functional limits for tasks assessed  VISION: Subjective report: No changes post CVA Baseline vision: Wears glasses for reading only  PERCEPTION: WFL  PRAXIS: Impaired: Motor planning; mild ataxia throughout the LUE  OBSERVATIONS: Mild ataxia noted throughout the LUE, mild weakness in the wrist, moderate weakness in the L hand as compared to the R dominant hand.    TODAY'S TREATMENT:                                                                                                                               DATE: 03/31/22 Therapeutic Exercise: Objective measures taken for d/c assessment.  HEP reviewed for LUE; pt indep with putty, dumbbell for wrist, forearm, and elbow strengthening, and coin manipulation activities.  Facilitated hand strengthening with use of hand gripper set at 28.9# for 3 trials to remove jumbo pegs from pegboard.  Able  to perform without any dropped pegs for all 3 trials.  Self Care: Participated in coin manipulation activities with coin pick up from table, storage of coins within palm of hand, transferring coins from palm of hand to fingertips to enable discarding coins one at a time without dropping into resistive slotted bank.    PATIENT EDUCATION: Education details: HEP review, continue to engage LUE into reaching activities  Person educated: Patient Education method: Explanation, Verbal cues, and Handouts Education comprehension: verbalized understanding, returned demonstration, verbal cues required, and needs further education  HOME EXERCISE PROGRAM: Blue theraputty; using LUE to reach for non-breakable ADL supplies, coin manipulation skills  GOALS: Goals reviewed with patient? Yes  SHORT TERM GOALS: Target date: 05/07/22  Pt will be indep to perform HEP for increasing LUE strength and coordination. Baseline: Eval: initiated theraputty HEP and advised on reaching activities during ADLs with the LUE; further training needed; 04/14/22: indep Goal status: achieved  LONG TERM GOALS: Target date: 06/18/22  Pt will increase FOTO score to 65 or better to indicate clinically relevant increase in perceived functional use of the LUE with daily tasks. Baseline: Eval: 59; predicted 73; 04/14/22: 77 Goal status: achieved  2.  Pt will increase L grip strength by 15 or more lbs to improve ability to hold and carry ADL supplies in the L hand. Baseline: Eval: L grip 50#, R 78#; pt reports L hand fatigues when holding ADL supplies and  often compensates with the opposite hand to avoid dropping items; 04/14/22: L grip 69#, R 84# Goal status: achieved  3.  Pt will increase L hand [facility] skills to enable pt to efficiently pick up coins from a table. Baseline: Eval: 9 hole L 34 sec, R 21 sec; difficulty picking up coins from table without sliding to edge; 04/14/22: L 29 sec, R 21 sec (pt able to efficiently pick up coins from table Goal status: achieved  4.  Pt will tie shoes efficiently and tightly. Baseline: Pt reports he ties his shoes loosely; 04/14/22: able to tie shoes tightly Goal status: achieved   ASSESSMENT:  CLINICAL IMPRESSION: Pt seen for 4 OT visits post CVA to address strength and coordination deficits in the L non-dominant arm.  Pt reports his L arm is feeling just about back to normal.  Pt now demonstrates return to baseline and normal measures for LUE strength and coordination in comparison to R dominant arm.  Pt has met all OT goals and is indep with HEP.  Pt in agreement with plan to discharge this visit.    PERFORMANCE DEFICITS: in functional skills including ADLs, IADLs, coordination, dexterity, strength, Fine motor control, Gross motor control, and UE functional use.  IMPAIRMENTS: are limiting patient from ADLs, IADLs, and work.   CO-MORBIDITIES: may have co-morbidities  that affects occupational performance. Patient will benefit from skilled OT to address above impairments and improve overall function.  MODIFICATION OR ASSISTANCE TO COMPLETE EVALUATION: No modification of tasks or assist necessary to complete an evaluation.  OT OCCUPATIONAL PROFILE AND HISTORY: Problem focused assessment: Including review of records relating to presenting problem.  CLINICAL DECISION MAKING: Moderate - several treatment options, min-mod task modification necessary  REHAB POTENTIAL: Excellent  EVALUATION COMPLEXITY: Moderate    PLAN:  OT FREQUENCY: 1-2x/week  OT DURATION: 12 weeks  PLANNED INTERVENTIONS: self  care/ADL training, therapeutic exercise, therapeutic activity, neuromuscular re-education, patient/family education, coping strategies training, and DME and/or AE instructions  RECOMMENDED OTHER SERVICES: N/A  CONSULTED AND AGREED WITH PLAN OF CARE: Patient  PLAN FOR NEXT SESSION: N/A; d/c this visit   Leta Speller, MS, OTR/L  Darleene Cleaver, OT 04/14/2022, 9:01 AM

## 2022-04-16 ENCOUNTER — Ambulatory Visit: Payer: 59

## 2022-04-21 ENCOUNTER — Ambulatory Visit: Payer: 59

## 2022-04-23 ENCOUNTER — Ambulatory Visit: Payer: 59

## 2022-04-28 ENCOUNTER — Ambulatory Visit: Payer: 59

## 2022-04-29 ENCOUNTER — Other Ambulatory Visit (INDEPENDENT_AMBULATORY_CARE_PROVIDER_SITE_OTHER): Payer: 59

## 2022-04-29 DIAGNOSIS — E039 Hypothyroidism, unspecified: Secondary | ICD-10-CM

## 2022-04-29 LAB — TSH: TSH: 0.01 u[IU]/mL — ABNORMAL LOW (ref 0.35–5.50)

## 2022-04-30 ENCOUNTER — Ambulatory Visit: Payer: 59

## 2022-04-30 ENCOUNTER — Other Ambulatory Visit: Payer: Self-pay | Admitting: Primary Care

## 2022-04-30 DIAGNOSIS — E039 Hypothyroidism, unspecified: Secondary | ICD-10-CM

## 2022-04-30 MED ORDER — LEVOTHYROXINE SODIUM 175 MCG PO TABS: ORAL_TABLET | ORAL | 0 refills | Status: DC

## 2022-05-05 ENCOUNTER — Ambulatory Visit: Payer: 59

## 2022-05-07 ENCOUNTER — Ambulatory Visit: Payer: 59

## 2022-05-12 ENCOUNTER — Ambulatory Visit: Payer: 59

## 2022-05-14 ENCOUNTER — Ambulatory Visit: Payer: 59

## 2022-06-12 ENCOUNTER — Other Ambulatory Visit: Payer: Self-pay | Admitting: Primary Care

## 2022-06-12 DIAGNOSIS — E039 Hypothyroidism, unspecified: Secondary | ICD-10-CM

## 2022-06-29 ENCOUNTER — Other Ambulatory Visit: Payer: 59

## 2022-07-09 ENCOUNTER — Other Ambulatory Visit: Payer: 59

## 2022-07-09 ENCOUNTER — Encounter: Payer: Self-pay | Admitting: Physician Assistant

## 2022-07-22 ENCOUNTER — Telehealth: Payer: Self-pay | Admitting: Primary Care

## 2022-07-22 DIAGNOSIS — E039 Hypothyroidism, unspecified: Secondary | ICD-10-CM

## 2022-07-22 MED ORDER — LEVOTHYROXINE SODIUM 175 MCG PO TABS: ORAL_TABLET | ORAL | 0 refills | Status: DC

## 2022-07-22 NOTE — Telephone Encounter (Signed)
Prescription Request  07/22/2022  LOV: 03/25/2022  What is the name of the medication or equipment? levothyroxine (SYNTHROID) 175 MCG tablet, patient stated that he has 2 left  Have you contacted your pharmacy to request a refill? No   Which pharmacy would you like this sent to?   [facility] [address] [place] - [address] [place] [address] Phone: [phone] Fax: [phone]    Patient notified that their request is being sent to the clinical staff for review and that they should receive a response within 2 business days.   Please advise at Mobile [phone] (mobile)

## 2022-07-22 NOTE — Addendum Note (Signed)
Addended by: Doreene Nest on: 07/22/2022 06:03 PM   Modules accepted: Orders

## 2022-07-22 NOTE — Telephone Encounter (Signed)
Noted. Refill(s) sent to pharmacy.  

## 2022-07-28 ENCOUNTER — Ambulatory Visit (INDEPENDENT_AMBULATORY_CARE_PROVIDER_SITE_OTHER): Payer: BC Managed Care – PPO | Admitting: Primary Care

## 2022-07-28 ENCOUNTER — Encounter: Payer: Self-pay | Admitting: Primary Care

## 2022-07-28 ENCOUNTER — Other Ambulatory Visit: Payer: Self-pay | Admitting: Primary Care

## 2022-07-28 VITALS — BP 136/80 | HR 81 | Temp 98.0°F | Ht 65.0 in | Wt 164.0 lb

## 2022-07-28 DIAGNOSIS — E039 Hypothyroidism, unspecified: Secondary | ICD-10-CM | POA: Diagnosis not present

## 2022-07-28 DIAGNOSIS — Z8546 Personal history of malignant neoplasm of prostate: Secondary | ICD-10-CM | POA: Diagnosis not present

## 2022-07-28 DIAGNOSIS — R7303 Prediabetes: Secondary | ICD-10-CM | POA: Diagnosis not present

## 2022-07-28 DIAGNOSIS — Z1211 Encounter for screening for malignant neoplasm of colon: Secondary | ICD-10-CM

## 2022-07-28 DIAGNOSIS — Z8673 Personal history of transient ischemic attack (TIA), and cerebral infarction without residual deficits: Secondary | ICD-10-CM

## 2022-07-28 DIAGNOSIS — Z125 Encounter for screening for malignant neoplasm of prostate: Secondary | ICD-10-CM

## 2022-07-28 DIAGNOSIS — Z9049 Acquired absence of other specified parts of digestive tract: Secondary | ICD-10-CM

## 2022-07-28 DIAGNOSIS — H6121 Impacted cerumen, right ear: Secondary | ICD-10-CM

## 2022-07-28 DIAGNOSIS — H6120 Impacted cerumen, unspecified ear: Secondary | ICD-10-CM | POA: Insufficient documentation

## 2022-07-28 LAB — BASIC METABOLIC PANEL
BUN: 10 mg/dL (ref 6–23)
CO2: 24 mEq/L (ref 19–32)
Calcium: 9.3 mg/dL (ref 8.4–10.5)
Chloride: 107 mEq/L (ref 96–112)
Creatinine, Ser: 0.93 mg/dL (ref 0.40–1.50)
GFR: 86.53 mL/min (ref >60.00)
Glucose, Bld: 94 mg/dL (ref 70–99)
Potassium: 4 mEq/L (ref 3.5–5.1)
Sodium: 139 mEq/L (ref 135–145)

## 2022-07-28 LAB — TSH: TSH: 0.38 u[IU]/mL (ref 0.35–5.50)

## 2022-07-28 LAB — PSA: PSA: 0.01 ng/mL — ABNORMAL LOW (ref 0.10–4.00)

## 2022-07-28 LAB — HEMOGLOBIN A1C: Hgb A1c MFr Bld: 6.3 % (ref 4.6–6.5)

## 2022-07-28 MED ORDER — LEVOTHYROXINE SODIUM 175 MCG PO TABS: ORAL_TABLET | ORAL | 0 refills | Status: DC

## 2022-07-28 NOTE — Assessment & Plan Note (Signed)
Right cerumen impaction identified on exam. Patient consented to irrigation of canals bilaterally.  Right canal irrigated. Patient tolerated well. TM's and canals post irrigation unremarkable.   Discussed home care instructions.   

## 2022-07-28 NOTE — Assessment & Plan Note (Signed)
Repeat A1C pending.  Discussed the importance of a healthy diet and regular exercise in order for weight loss, and to reduce the risk of further co-morbidity.  

## 2022-07-28 NOTE — Assessment & Plan Note (Signed)
Overdue for repeat colonoscopy. Referral placed to GI. 

## 2022-07-28 NOTE — Assessment & Plan Note (Signed)
No residual or new symptoms.  Continue atorvastatin 40 mg daily and aspirin 81 mg daily.

## 2022-07-28 NOTE — Progress Notes (Signed)
Subjective:    Patient ID: Tony Hernandez, male    DOB: 08/26/1955, 67 y.o.   MRN: [id number]  GI Problem Primary symptoms do not include diarrhea.  The illness does not include constipation.    Tony Hernandez is a very pleasant 67 y.o. male with a history of hypothyroidism, prostate cancer, CVA, prediabetes who presents today for follow up of chronic conditions. He is overdue for colonoscopy.   1) Hypothyroidism: Currently managed on levothyroxine 175 mcg daily x 5 days weekly and 87.5 mcg 2 days weekly. His last TSH was 0.01 in February 2024. He is due for recheck today.  He is taking levothyroxine every morning on an empty stomach with water only.   No food or other medications for 30 minutes.   No heartburn medication, iron pills, calcium, vitamin D, or magnesium pills within four hours of taking levothyroxine.   2) Hyperlipidemia; Currently managed on atorvastatin 40 mg daily. Lipid panel from December 2023 with LDL of 88. History of CVA in December 2023, no obvious cause for CVA based on work up.  Today he's feeling well. Denies weakness, numbness, changes in speech.   3) Ear Fullness: Acute for the last 2 weeks. He has noticed a reduction in hearing. He wears ear pods daily for work. He denies pain, post nasal drip, runny nose.    Review of Systems  Respiratory:  Negative for shortness of breath.   Cardiovascular:  Negative for chest pain.  Gastrointestinal:  Negative for constipation and diarrhea.  Neurological:  Negative for dizziness, numbness and headaches.         Past Medical History:  Diagnosis Date   Arthritis    knees   Generalized abdominal pain 06/19/2021   GERD (gastroesophageal reflux disease)    Microscopic hematuria 09/03/2021   Thyroid disease    Wears dentures     Social History   Socioeconomic History   Marital status: Married    Spouse name: Not on file   Number of children: Not on file   Years of education: Not on file   Highest education  level: Not on file  Occupational History   Not on file  Tobacco Use   Smoking status: Former    Years: 10    Types: Cigarettes    Quit date: 06/21/2001    Years since quitting: 21.1   Smokeless tobacco: Never  Vaping Use   Vaping Use: Never used  Substance and Sexual Activity   Alcohol use: No    Alcohol/week: 0.0 standard drinks of alcohol    Comment: quit 18 years ago   Drug use: Not Currently    Types: Cocaine, Marijuana    Comment: quit 18 years ago    Sexual activity: Yes    Birth control/protection: None  Other Topics Concern   Not on file  Social History Narrative   Single.   2 children.   Works as a Naval architect.   Enjoys spending time with family.   Social Determinants of Health   Financial Resource Strain: Not on file  Food Insecurity: No Food Insecurity (03/13/2022)   Hunger Vital Sign    Worried About Running Out of Food in the Last Year: Never true    Ran Out of Food in the Last Year: Never true  Transportation Needs: No Transportation Needs (03/13/2022)   PRAPARE - Administrator, Civil Service (Medical): No    Lack of Transportation (Non-Medical): No  Physical Activity: Not on file  Stress: Not on file  Social Connections: Not on file  Intimate Partner Violence: Not At Risk (03/13/2022)   Humiliation, Afraid, Rape, and Kick questionnaire    Fear of Current or Ex-Partner: No    Emotionally Abused: No    Physically Abused: No    Sexually Abused: No    Past Surgical History:  Procedure Laterality Date   CHOLECYSTECTOMY  2018   COLONOSCOPY WITH PROPOFOL N/A 05/19/2019   Procedure: COLONOSCOPY WITH PROPOFOL;  Surgeon: Wyline Mood, MD;  Location: [facility];  Service: Endoscopy;  Laterality: N/A;  Priority 4   POLYPECTOMY  05/19/2019   Procedure: POLYPECTOMY;  Surgeon: Wyline Mood, MD;  Location: [facility];  Service: Endoscopy;;   ROBOT ASSISTED LAPAROSCOPIC RADICAL PROSTATECTOMY Bilateral 08/17/2019   Procedure: XI ROBOTIC  ASSISTED LAPAROSCOPIC PROSTATECTOMY, BILATERAL LYMPH NODE DISSECTION;  Surgeon: Vanna Scotland, MD;  Location: [facility];  Service: Urology;  Laterality: Bilateral;    Family History  Problem Relation Age of Onset   Lung cancer Mother 66   Diabetes Paternal Grandmother    Colon cancer Sister 49    No Known Allergies  Current Outpatient Medications on File Prior to Visit  Medication Sig Dispense Refill   aspirin EC 81 MG tablet Take 1 tablet (81 mg total) by mouth daily. Swallow whole. 30 tablet 12   levothyroxine (SYNTHROID) 175 MCG tablet Take 1 tablet 5 days weekly and 1/2 tablet 2 days weekly by mouth every morning on an empty stomach with water only.  No food or other medications for 30 minutes. 72 tablet 0   atorvastatin (LIPITOR) 40 MG tablet Take 1 tablet (40 mg total) by mouth daily. 31 tablet 0   NONFORMULARY OR COMPOUNDED ITEM Trimix (30/1/10)-(Pap/Phent/PGE)  Dosage: Inject 0.3-0.7  cc per injection  Vial 1ml  Qty #5 Refills 6  [facility] [phone] Fax [phone] (Patient not taking: Reported on 03/13/2022) 3 each 0   sildenafil (REVATIO) 20 MG tablet Take 1-5 tablets (20-100 mg total) by mouth 3 (three) times daily. (Patient not taking: Reported on 03/26/2022) 30 tablet 3   No current facility-administered medications on file prior to visit.    BP 136/80   Pulse 81   Temp 98 F (36.7 C) (Temporal)   Ht 5\' 5"  (1.651 m)   Wt 164 lb (74.4 kg)   SpO2 98%   BMI 27.29 kg/m  Objective:   Physical Exam Cardiovascular:     Rate and Rhythm: Normal rate and regular rhythm.  Pulmonary:     Effort: Pulmonary effort is normal.     Breath sounds: Normal breath sounds. No wheezing or rales.  Abdominal:     General: Bowel sounds are normal.     Palpations: Abdomen is soft.     Tenderness: There is no abdominal tenderness.  Musculoskeletal:     Cervical back: Neck supple.  Skin:    General: Skin is warm and dry.  Neurological:     Mental Status: He is  alert and oriented to person, place, and time.  Psychiatric:        Mood and Affect: Mood normal.           Assessment & Plan:  Hypothyroidism, unspecified type Assessment & Plan: He is taking levothyroxine correctly.  Continue levothyroxine 175 mcg daily x 5 days and 87.5 mcg daily x 2 days weekly. Repeat TSH pending.  Orders: -     TSH -     Basic metabolic panel  Prediabetes Assessment &  Plan: Repeat A1C pending.  Discussed the importance of a healthy diet and regular exercise in order for weight loss, and to reduce the risk of further co-morbidity.   Orders: -     Hemoglobin A1c -     Basic metabolic panel  History of CVA (cerebrovascular accident) Assessment & Plan: No residual or new symptoms.  Continue atorvastatin 40 mg daily and aspirin 81 mg daily.   History of prostate cancer -     PSA  Screening for prostate cancer -     PSA  Impacted cerumen of right ear Assessment & Plan: Right cerumen impaction identified on exam. Patient consented to irrigation of canals bilaterally.  Right canal irrigated. Patient tolerated well. TM's and canals post irrigation unremarkable.   Discussed home care instructions.     Screening for colon cancer -     Ambulatory referral to Gastroenterology  S/P right colectomy Assessment & Plan: Overdue for repeat colonoscopy. Referral placed to GI.         Doreene Nest, NP

## 2022-07-28 NOTE — Patient Instructions (Signed)
Stop by the lab prior to leaving today. I will notify you of your results once received.   It was a pleasure to see you today!  

## 2022-07-28 NOTE — Assessment & Plan Note (Signed)
He is taking levothyroxine correctly.  Continue levothyroxine 175 mcg daily x 5 days and 87.5 mcg daily x 2 days weekly. Repeat TSH pending.

## 2022-07-29 ENCOUNTER — Telehealth: Payer: Self-pay | Admitting: Primary Care

## 2022-07-29 NOTE — Telephone Encounter (Signed)
See result note for further details

## 2022-07-29 NOTE — Telephone Encounter (Signed)
Pt called in regarding lab results . Would like a call back # [phone]

## 2022-08-06 ENCOUNTER — Encounter: Payer: Self-pay | Admitting: *Deleted

## 2022-08-27 ENCOUNTER — Encounter: Payer: Self-pay | Admitting: Primary Care

## 2022-08-27 ENCOUNTER — Ambulatory Visit: Payer: BC Managed Care – PPO | Admitting: Primary Care

## 2022-08-27 VITALS — BP 150/84 | HR 79 | Temp 97.4°F | Ht 65.0 in | Wt 158.0 lb

## 2022-08-27 DIAGNOSIS — R1084 Generalized abdominal pain: Secondary | ICD-10-CM | POA: Diagnosis not present

## 2022-08-27 DIAGNOSIS — R197 Diarrhea, unspecified: Secondary | ICD-10-CM

## 2022-08-27 DIAGNOSIS — Z1211 Encounter for screening for malignant neoplasm of colon: Secondary | ICD-10-CM

## 2022-08-27 HISTORY — DX: Diarrhea, unspecified: R19.7

## 2022-08-27 NOTE — Addendum Note (Signed)
Addended by: Doreene Nest on: 08/27/2022 02:26 PM   Modules accepted: Orders

## 2022-08-27 NOTE — Addendum Note (Signed)
Addended by: Eual Fines on: 08/27/2022 02:44 PM   Modules accepted: Orders

## 2022-08-27 NOTE — Assessment & Plan Note (Addendum)
Unclear etiology.  Checking labs today including CBC with differential, CMP, lipase Stool studies ordered and pending.  Referral placed to GI for colonoscopy.  Consider CT abdomen/pelvis, especially given medical history. Await lab results.

## 2022-08-27 NOTE — Assessment & Plan Note (Signed)
Checking labs today including CBC with differential, CMP. Stool studies ordered and pending, encouraged patient to return to soon as possible.  Discussed the importance of proper hydration with water.  Await results.

## 2022-08-27 NOTE — Patient Instructions (Signed)
Stop by the lab prior to leaving today. I will notify you of your results once received.   Please return the stool as a soon as possible.  Please work on a very bland diet.  Take a look at the information provided.  You will either be contacted via phone regarding your referral to GI, or you may receive a letter on your MyChart portal from our referral team with instructions for scheduling an appointment. Please let us know if you have not been contacted by anyone within two weeks.  It was a pleasure to see you today!

## 2022-08-27 NOTE — Progress Notes (Addendum)
Subjective:    Patient ID: Tony Hernandez, male    DOB: 16-Aug-1955, 67 y.o.   MRN: [id number]  Abdominal Pain Associated symptoms include diarrhea. Pertinent negatives include no constipation, fever, nausea or vomiting.    Tony Hernandez is a very pleasant 67 y.o. male with a history of hypothyroidism, GERD, colon cancer with right colectomy,CVA, prostate cancer, cholecystectomy, prediabetes who presents today to discuss abdominal pain.  Symptom onset 1 month ago with mid bilateral abdominal pain. His pain is intermittent, occurred every few days until about 2 weeks ago. His pain is now intermittent throughout the day everyday which feels like a "sharp" pain.  About 1 week he began noticing changes in his bowels including 3-4 episodes of loose stools daily.  His symptoms occur within 1-2 hours of eating.  He is eating well without difficulty.  He has noticed weight loss but questions if this is from the recurrent diarrhea.  He began taking Nexium daily about one month ago with some improvement in symptoms.  He does not take NSAID medicines regularly.  He denies constipation, nausea, vomiting, bloody stools, fevers, decreased appetite, recent antibiotic use, sick contacts, esophageal burning.   He is hydrating well with water.  He is overdue for repeat colonoscopy.  His last colonoscopy was in 2021, recommendations were to return 5 months later due to piecemeal removal of polyp.  We referred him to GI in July 2023, GI was unable to connect with the patient.  Wt Readings from Last 3 Encounters:  08/27/22 158 lb (71.7 kg)  07/28/22 164 lb (74.4 kg)  03/25/22 165 lb (74.8 kg)      Review of Systems  Constitutional:  Positive for unexpected weight change. Negative for appetite change, chills and fever.  Gastrointestinal:  Positive for abdominal pain and diarrhea. Negative for blood in stool, constipation, nausea and vomiting.         Past Medical History:  Diagnosis Date   Arthritis     knees   Generalized abdominal pain 06/19/2021   GERD (gastroesophageal reflux disease)    Microscopic hematuria 09/03/2021   Thyroid disease    Wears dentures     Social History   Socioeconomic History   Marital status: Married    Spouse name: Not on file   Number of children: Not on file   Years of education: Not on file   Highest education level: Not on file  Occupational History   Not on file  Tobacco Use   Smoking status: Former    Years: 10    Types: Cigarettes    Quit date: 06/21/2001    Years since quitting: 21.1   Smokeless tobacco: Never  Vaping Use   Vaping Use: Never used  Substance and Sexual Activity   Alcohol use: No    Alcohol/week: 0.0 standard drinks of alcohol    Comment: quit 18 years ago   Drug use: Not Currently    Types: Cocaine, Marijuana    Comment: quit 18 years ago    Sexual activity: Yes    Birth control/protection: None  Other Topics Concern   Not on file  Social History Narrative   Single.   2 children.   Works as a [employer] architect.   Enjoys spending time with family.   Social Determinants of Health   Financial Resource Strain: Not on file  Food Insecurity: No Food Insecurity (03/13/2022)   Hunger Vital Sign    Worried About Running Out of Food in the Last  Year: Never true    Ran Out of Food in the Last Year: Never true  Transportation Needs: No Transportation Needs (03/13/2022)   PRAPARE - Administrator, Civil Service (Medical): No    Lack of Transportation (Non-Medical): No  Physical Activity: Not on file  Stress: Not on file  Social Connections: Not on file  Intimate Partner Violence: Not At Risk (03/13/2022)   Humiliation, Afraid, Rape, and Kick questionnaire    Fear of Current or Ex-Partner: No    Emotionally Abused: No    Physically Abused: No    Sexually Abused: No    Past Surgical History:  Procedure Laterality Date   CHOLECYSTECTOMY  2018   COLONOSCOPY WITH PROPOFOL N/A 05/19/2019   Procedure:  COLONOSCOPY WITH PROPOFOL;  Surgeon: Wyline Mood, MD;  Location: [facility];  Service: Endoscopy;  Laterality: N/A;  Priority 4   POLYPECTOMY  05/19/2019   Procedure: POLYPECTOMY;  Surgeon: Wyline Mood, MD;  Location: [facility];  Service: Endoscopy;;   ROBOT ASSISTED LAPAROSCOPIC RADICAL PROSTATECTOMY Bilateral 08/17/2019   Procedure: XI ROBOTIC ASSISTED LAPAROSCOPIC PROSTATECTOMY, BILATERAL LYMPH NODE DISSECTION;  Surgeon: Vanna Scotland, MD;  Location: [facility];  Service: Urology;  Laterality: Bilateral;    Family History  Problem Relation Age of Onset   Lung cancer Mother 56   Diabetes Paternal Grandmother    Colon cancer Sister 7    No Known Allergies  Current Outpatient Medications on File Prior to Visit  Medication Sig Dispense Refill   aspirin EC 81 MG tablet Take 1 tablet (81 mg total) by mouth daily. Swallow whole. 30 tablet 12   levothyroxine (SYNTHROID) 175 MCG tablet Take 1 tablet 4 days weekly and 1/2 tablet 3 days weekly by mouth every morning on an empty stomach with water only.  No food or other medications for 30 minutes. 90 tablet 0   NONFORMULARY OR COMPOUNDED ITEM Trimix (30/1/10)-(Pap/Phent/PGE)  Dosage: Inject 0.3-0.7  cc per injection  Vial 1ml  Qty #5 Refills 6  [facility] [phone] Fax [phone] 3 each 0   sildenafil (REVATIO) 20 MG tablet Take 1-5 tablets (20-100 mg total) by mouth 3 (three) times daily. 30 tablet 3   atorvastatin (LIPITOR) 40 MG tablet Take 1 tablet (40 mg total) by mouth daily. 31 tablet 0   No current facility-administered medications on file prior to visit.    BP (!) 150/84   Pulse 79   Temp (!) 97.4 F (36.3 C) (Temporal)   Ht 5\' 5"  (1.651 m)   Wt 158 lb (71.7 kg)   SpO2 99%   BMI 26.29 kg/m  Objective:   Physical Exam Constitutional:      General: He is not in acute distress. Cardiovascular:     Rate and Rhythm: Normal rate and regular rhythm.  Pulmonary:     Effort: Pulmonary  effort is normal.  Abdominal:     General: Bowel sounds are normal.     Palpations: Abdomen is soft.     Tenderness: There is abdominal tenderness in the periumbilical area.  Musculoskeletal:     Cervical back: Neck supple.  Skin:    General: Skin is warm and dry.  Neurological:     Mental Status: He is alert and oriented to person, place, and time.           Assessment & Plan:  Generalized abdominal pain Assessment & Plan: Unclear etiology.  Checking labs today including CBC with differential, CMP, lipase Stool studies ordered  and pending.  Referral placed to GI for colonoscopy.  Consider CT abdomen/pelvis, especially given medical history. Await lab results.   Orders: -     Giardia antigen -     Gastrointestinal Pathogen Pnl RT, PCR -     C. difficile GDH and Toxin A/B -     CBC with Differential/Platelet -     Comprehensive metabolic panel -     Lipase  Screening for colon cancer -     Ambulatory referral to Gastroenterology  Acute diarrhea Assessment & Plan: Checking labs today including CBC with differential, CMP. Stool studies ordered and pending, encouraged patient to return to soon as possible.  Discussed the importance of proper hydration with water.  Await results.  Orders: -     Giardia antigen -     Gastrointestinal Pathogen Pnl RT, PCR -     C. difficile GDH and Toxin A/B -     CBC with Differential/Platelet -     Comprehensive metabolic panel -     Lipase        Doreene Nest, NP

## 2022-08-28 ENCOUNTER — Encounter: Payer: Self-pay | Admitting: Radiology

## 2022-08-28 ENCOUNTER — Other Ambulatory Visit (INDEPENDENT_AMBULATORY_CARE_PROVIDER_SITE_OTHER): Payer: BC Managed Care – PPO

## 2022-08-28 DIAGNOSIS — D649 Anemia, unspecified: Secondary | ICD-10-CM | POA: Insufficient documentation

## 2022-08-28 LAB — CBC WITH DIFFERENTIAL/PLATELET
Basophils Absolute: 0.1 10*3/uL (ref 0.0–0.1)
Basophils Relative: 1.2 % (ref 0.0–3.0)
Eosinophils Absolute: 0.4 10*3/uL (ref 0.0–0.7)
Eosinophils Relative: 5.6 % — ABNORMAL HIGH (ref 0.0–5.0)
HCT: 41.5 % (ref 39.0–52.0)
Hemoglobin: 12.9 g/dL — ABNORMAL LOW (ref 13.0–17.0)
Lymphocytes Relative: 36.7 % (ref 12.0–46.0)
Lymphs Abs: 2.8 10*3/uL (ref 0.7–4.0)
MCHC: 31.2 g/dL (ref 30.0–36.0)
MCV: 76.8 fl — ABNORMAL LOW (ref 78.0–100.0)
Monocytes Absolute: 0.7 10*3/uL (ref 0.1–1.0)
Monocytes Relative: 9.4 % (ref 3.0–12.0)
Neutro Abs: 3.6 10*3/uL (ref 1.4–7.7)
Neutrophils Relative %: 47.1 % (ref 43.0–77.0)
Platelets: 330 10*3/uL (ref 150.0–400.0)
RBC: 5.4 Mil/uL (ref 4.22–5.81)
RDW: 15 % (ref 11.5–15.5)
WBC: 7.6 10*3/uL (ref 4.0–10.5)

## 2022-08-28 LAB — COMPREHENSIVE METABOLIC PANEL
ALT: 20 U/L (ref 0–53)
AST: 18 U/L (ref 0–37)
Albumin: 4.2 g/dL (ref 3.5–5.2)
Alkaline Phosphatase: 64 U/L (ref 39–117)
BUN: 11 mg/dL (ref 6–23)
CO2: 26 mEq/L (ref 19–32)
Calcium: 9.5 mg/dL (ref 8.4–10.5)
Chloride: 104 mEq/L (ref 96–112)
Creatinine, Ser: 1.04 mg/dL (ref 0.40–1.50)
GFR: 75.62 mL/min (ref >60.00)
Glucose, Bld: 74 mg/dL (ref 70–99)
Potassium: 4.7 mEq/L (ref 3.5–5.1)
Sodium: 137 mEq/L (ref 135–145)
Total Bilirubin: 0.3 mg/dL (ref 0.2–1.2)
Total Protein: 6.7 g/dL (ref 6.0–8.3)

## 2022-08-28 LAB — IBC + FERRITIN
Ferritin: 108.9 ng/mL (ref 22.0–322.0)
Iron: 73 ug/dL (ref 42–165)
Saturation Ratios: 18.8 % — ABNORMAL LOW (ref 20.0–50.0)
TIBC: 389.2 ug/dL (ref 250.0–450.0)
Transferrin: 278 mg/dL (ref 212.0–360.0)

## 2022-08-28 LAB — LIPASE: Lipase: 19 U/L (ref 11.0–59.0)

## 2022-08-31 ENCOUNTER — Telehealth: Payer: Self-pay | Admitting: Primary Care

## 2022-08-31 ENCOUNTER — Other Ambulatory Visit: Payer: Self-pay | Admitting: Radiology

## 2022-08-31 DIAGNOSIS — R197 Diarrhea, unspecified: Secondary | ICD-10-CM

## 2022-08-31 DIAGNOSIS — R1084 Generalized abdominal pain: Secondary | ICD-10-CM | POA: Diagnosis not present

## 2022-08-31 NOTE — Telephone Encounter (Signed)
Patient contacted the office regarding lab results, advised patient lab results have come back but Tony Hernandez has not read them yet. Advised patient to give some time and he should receive a call. Patient stated he does not use his mychart so a phone call is the best method of contact.

## 2022-08-31 NOTE — Telephone Encounter (Signed)
Called patient and reviewed all MyChart messages associated with lab results from 06/06 and 06/07.  FYI- reviewing results over phone is best per patient

## 2022-08-31 NOTE — Telephone Encounter (Signed)
Noted. We will do our best to remember this!

## 2022-09-01 ENCOUNTER — Telehealth: Payer: Self-pay

## 2022-09-01 DIAGNOSIS — Z86010 Personal history of colonic polyps: Secondary | ICD-10-CM

## 2022-09-01 LAB — GIARDIA ANTIGEN
MICRO NUMBER:: 15062112
RESULT:: NOT DETECTED
SPECIMEN QUALITY:: ADEQUATE

## 2022-09-01 LAB — C. DIFFICILE GDH AND TOXIN A/B
GDH ANTIGEN: NOT DETECTED
MICRO NUMBER:: 15063020
SPECIMEN QUALITY:: ADEQUATE
TOXIN A AND B: NOT DETECTED

## 2022-09-01 NOTE — Telephone Encounter (Signed)
Gastroenterology Pre-Procedure Review  Request Date: TBD (Patient experiencing GI Issues) Requesting Physician: TBD  PATIENT REVIEW QUESTIONS: The patient responded to the following health history questions as indicated:    1. Are you having any GI issues? yes (abdominal pain and diarrhea experienced in the mornings after he uses the restroom it gets better but comes back.  This has been going on for over a month) 2. Do you have a personal history of Polyps? yes (last colonoscopy 05/19/19 with Dr. Tobi Bastos 10 mm polyp and 20 mm polyp noted) 3. Do you have a family history of Colon Cancer or Polyps? no 4. Diabetes Mellitus? no 5. Joint replacements in the past 12 months?no 6. Major health problems in the past 3 months?no 7. Any artificial heart valves, MVP, or defibrillator?no    MEDICATIONS & ALLERGIES:    Patient reports the following regarding taking any anticoagulation/antiplatelet therapy:   Plavix, Coumadin, Eliquis, Xarelto, Lovenox, Pradaxa, Brilinta, or Effient? no Aspirin? yes (81 mg daily)  Patient confirms/reports the following medications:  Current Outpatient Medications  Medication Sig Dispense Refill   aspirin EC 81 MG tablet Take 1 tablet (81 mg total) by mouth daily. Swallow whole. 30 tablet 12   atorvastatin (LIPITOR) 40 MG tablet Take 1 tablet (40 mg total) by mouth daily. 31 tablet 0   levothyroxine (SYNTHROID) 175 MCG tablet Take 1 tablet 4 days weekly and 1/2 tablet 3 days weekly by mouth every morning on an empty stomach with water only.  No food or other medications for 30 minutes. 90 tablet 0   NONFORMULARY OR COMPOUNDED ITEM Trimix (30/1/10)-(Pap/Phent/PGE)  Dosage: Inject 0.3-0.7  cc per injection  Vial 1ml  Qty #5 Refills 6  [facility] [phone] Fax [phone] 3 each 0   sildenafil (REVATIO) 20 MG tablet Take 1-5 tablets (20-100 mg total) by mouth 3 (three) times daily. 30 tablet 3   No current facility-administered medications for this  visit.    Patient confirms/reports the following allergies:  No Known Allergies  No orders of the defined types were placed in this encounter.   AUTHORIZATION INFORMATION Primary Insurance: 1D#: Group #:  Secondary Insurance: 1D#: Group #:  SCHEDULE INFORMATION: Date: TBD Time: Location: [facility]

## 2022-09-01 NOTE — Telephone Encounter (Signed)
Office visit scheduled for 07/11 at 8:15 am instead of 09/29/22.  Thanks,  Etna Green, [place]

## 2022-09-02 LAB — GASTROINTESTINAL PATHOGEN PNL
CampyloBacter Group: NOT DETECTED
Norovirus GI/GII: NOT DETECTED
Rotavirus A: NOT DETECTED
Salmonella species: NOT DETECTED
Shiga Toxin 1: NOT DETECTED
Shiga Toxin 2: NOT DETECTED
Shigella Species: NOT DETECTED
Vibrio Group: NOT DETECTED
Yersinia enterocolitica: NOT DETECTED

## 2022-09-23 ENCOUNTER — Ambulatory Visit: Payer: BC Managed Care – PPO | Admitting: Physician Assistant

## 2022-10-01 ENCOUNTER — Encounter: Payer: Self-pay | Admitting: Physician Assistant

## 2022-10-01 ENCOUNTER — Ambulatory Visit: Payer: BC Managed Care – PPO | Admitting: Physician Assistant

## 2022-10-01 NOTE — Progress Notes (Deleted)
Celso Amy, PA-C [address]  [address]  [place], [place] [address]  Main: [phone]  Fax: [phone]   Gastroenterology Consultation  Referring Provider:     Doreene Nest, NP Primary Care Physician:  Doreene Nest, NP Primary Gastroenterologist:  *** Reason for Consultation:     ***        HPI:   FREDDY KINNE is a 67 y.o. y/o male referred for consultation & management  by Doreene Nest, NP.  ***  Past Medical History:  Diagnosis Date   Arthritis    knees   Generalized abdominal pain 06/19/2021   GERD (gastroesophageal reflux disease)    Microscopic hematuria 09/03/2021   Thyroid disease    Wears dentures     Past Surgical History:  Procedure Laterality Date   CHOLECYSTECTOMY  2018   COLONOSCOPY WITH PROPOFOL N/A 05/19/2019   Procedure: COLONOSCOPY WITH PROPOFOL;  Surgeon: Wyline Mood, MD;  Location: [facility];  Service: Endoscopy;  Laterality: N/A;  Priority 4   POLYPECTOMY  05/19/2019   Procedure: POLYPECTOMY;  Surgeon: Wyline Mood, MD;  Location: [facility];  Service: Endoscopy;;   ROBOT ASSISTED LAPAROSCOPIC RADICAL PROSTATECTOMY Bilateral 08/17/2019   Procedure: XI ROBOTIC ASSISTED LAPAROSCOPIC PROSTATECTOMY, BILATERAL LYMPH NODE DISSECTION;  Surgeon: Vanna Scotland, MD;  Location: [facility];  Service: Urology;  Laterality: Bilateral;    Prior to Admission medications   Medication Sig Start Date End Date Taking? Authorizing Provider  aspirin EC 81 MG tablet Take 1 tablet (81 mg total) by mouth daily. Swallow whole. 03/14/22   Reva Bores, MD  atorvastatin (LIPITOR) 40 MG tablet Take 1 tablet (40 mg total) by mouth daily. 03/14/22 04/14/22  Reva Bores, MD  levothyroxine (SYNTHROID) 175 MCG tablet Take 1 tablet 4 days weekly and 1/2 tablet 3 days weekly by mouth every morning on an empty stomach with water only.  No food or other medications for 30 minutes. 07/28/22   Doreene Nest, NP  NONFORMULARY OR  COMPOUNDED ITEM Trimix (30/1/10)-(Pap/Phent/PGE)  Dosage: Inject 0.3-0.7  cc per injection  Vial 1ml  Qty #5 Refills 6  [facility] [phone] Fax [phone] 06/05/21   Michiel Cowboy A, PA-C  sildenafil (REVATIO) 20 MG tablet Take 1-5 tablets (20-100 mg total) by mouth 3 (three) times daily. 01/09/21   Vanna Scotland, MD    Family History  Problem Relation Age of Onset   Lung cancer Mother 58   Diabetes Paternal Grandmother    Colon cancer Sister 77     Social History   Tobacco Use   Smoking status: Former    Current packs/day: 0.00    Types: Cigarettes    Start date: 06/22/1991    Quit date: 06/21/2001    Years since quitting: 21.2   Smokeless tobacco: Never  Vaping Use   Vaping status: Never Used  Substance Use Topics   Alcohol use: No    Alcohol/week: 0.0 standard drinks of alcohol    Comment: quit 18 years ago   Drug use: Not Currently    Types: Cocaine, Marijuana    Comment: quit 18 years ago     Allergies as of 10/01/2022   (No Known Allergies)    Review of Systems:    All systems reviewed and negative except where noted in HPI.   Physical Exam:  There were no vitals taken for this visit. No LMP for male patient. Psych:  Alert and cooperative. Normal mood and affect.  General:   Alert,  Well-developed, well-nourished, pleasant and cooperative in NAD Head:  Normocephalic and atraumatic. Eyes:  Sclera clear, no icterus.   Conjunctiva pink. Neck:  Supple; no masses or thyromegaly. Lungs:  Respirations even and unlabored.  Clear throughout to auscultation.   No wheezes, crackles, or rhonchi. No acute distress. Heart:  Regular rate and rhythm; no murmurs, clicks, rubs, or gallops. Abdomen:  Normal bowel sounds.  No bruits.  Soft, and non-distended without masses, hepatosplenomegaly or hernias noted.  No Tenderness.  No guarding or rebound tenderness.    Neurologic:  Alert and oriented x3;  grossly normal neurologically. Psych:  Alert and  cooperative. Normal mood and affect.  Imaging Studies: No results found.  Assessment and Plan:   DARRIEN BELTER is a 67 y.o. y/o male has been referred for ***  Follow up in ***  Celso Amy, PA-C    BP check ***

## 2022-10-12 ENCOUNTER — Telehealth: Payer: Self-pay | Admitting: Primary Care

## 2022-10-12 DIAGNOSIS — E039 Hypothyroidism, unspecified: Secondary | ICD-10-CM

## 2022-10-12 MED ORDER — LEVOTHYROXINE SODIUM 175 MCG PO TABS: ORAL_TABLET | ORAL | 0 refills | Status: DC

## 2022-10-12 NOTE — Telephone Encounter (Signed)
Prescription Request  10/12/2022  LOV: 08/27/2022  What is the name of the medication or equipment? levothyroxine (SYNTHROID) 175 MCG tablet [[id number]]  Have you contacted your pharmacy to request a refill? No   Which pharmacy would you like this sent to?    [id number] Judithann Sheen, [place] - [address] [place] [address] Phone: [phone] Fax: [phone]    Patient notified that their request is being sent to the clinical staff for review and that they should receive a response within 2 business days.   Please advise at Mobile [phone] (mobile)

## 2022-10-12 NOTE — Telephone Encounter (Signed)
Noted Rx sent to pharmacy  

## 2022-10-20 ENCOUNTER — Encounter: Payer: 59 | Admitting: Primary Care

## 2022-10-21 ENCOUNTER — Encounter: Payer: Self-pay | Admitting: Primary Care

## 2022-10-21 NOTE — Telephone Encounter (Signed)
Error

## 2022-10-27 ENCOUNTER — Ambulatory Visit: Payer: BC Managed Care – PPO | Admitting: Primary Care

## 2022-10-27 ENCOUNTER — Encounter: Payer: Self-pay | Admitting: Primary Care

## 2022-10-27 VITALS — BP 138/62 | HR 87 | Temp 97.6°F | Ht 65.0 in | Wt 157.0 lb

## 2022-10-27 DIAGNOSIS — E039 Hypothyroidism, unspecified: Secondary | ICD-10-CM

## 2022-10-27 DIAGNOSIS — R1010 Upper abdominal pain, unspecified: Secondary | ICD-10-CM

## 2022-10-27 LAB — TSH: TSH: 0.12 u[IU]/mL — ABNORMAL LOW (ref 0.35–5.50)

## 2022-10-27 NOTE — Progress Notes (Signed)
Subjective:    Patient ID: Tony Hernandez, male    DOB: [birth year], 67 y.o.   MRN: [id number]  Abdominal Pain Associated symptoms include diarrhea. Pertinent negatives include no fever, nausea or vomiting.    Tony Hernandez is a very pleasant 67 y.o. male with a history of GERD, acute diarrhea, hypothyroidism, right colectomy, prostate cancer, CVA, prediabetes, anemia who presents today to discuss abdominal pain.  He was last evaluated on 08/27/2022 for a 1 month history of intermittent, mid bilateral abdominal pain that had persisted. Also with bowel changes including 3-4 episodes of loose stools daily which occurred within 1-2 hours of eating. During this visit he underwent stool studies which were negative. Labs were with mild, stable, anemia, otherwise negative. He was referred to GI for colonoscopy as he was due.  Today he discusses that he had to cancel his GI appointment scheduled for 09/23/22 and no showed his visit on 10/01/22.   He continues to notice his abdominal pain which is intermittent. Symptoms come and go for a few days, then a few days without pain. His pain can be noticeable at any time of the day, sometimes wakes him from sleep. His pain is located to the bilateral upper abdomen which is sharp which will last a few hours. His symptoms occur with and without eating. He continues to experience a mixture of loose and formed stools.   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 When home he eats better.    Review of Systems  Constitutional:  Negative for fever and unexpected weight change.  Gastrointestinal:  Positive for abdominal pain and diarrhea. Negative for blood in stool, nausea and vomiting.  Neurological:  Negative for dizziness.         Past Medical History:  Diagnosis Date    Arthritis    knees   Generalized abdominal pain 06/19/2021   GERD (gastroesophageal reflux disease)    Microscopic hematuria 09/03/2021   Thyroid disease    Wears dentures     Social History   Socioeconomic History   Marital status: Married    Spouse name: Not on file   Number of children: Not on file   Years of education: Not on file   Highest education level: Not on file  Occupational History   Not on file  Tobacco Use   Smoking status: Former    Current packs/day: 0.00    Types: Cigarettes    Start date: 06/22/1991    Quit date: 06/21/2001    Years since quitting: 21.3   Smokeless tobacco: Never  Vaping Use   Vaping status: Never Used  Substance and Sexual Activity   Alcohol use: No    Alcohol/week: 0.0 standard drinks of alcohol    Comment: quit 18 years ago   Drug use: Not Currently    Types: Cocaine, Marijuana    Comment: quit 18 years ago    Sexual activity: Yes    Birth control/protection: None  Other Topics Concern   Not on file  Social History Narrative   Single.   2 children.   Works as a Naval architect.   Enjoys spending time with family.   Social Determinants of Health   Financial Resource Strain: Not on file  Food Insecurity: No Food Insecurity (03/13/2022)   Hunger Vital Sign    Worried  About Running Out of Food in the Last Year: Never true    Ran Out of Food in the Last Year: Never true  Transportation Needs: No Transportation Needs (03/13/2022)   PRAPARE - Administrator, Civil Service (Medical): No    Lack of Transportation (Non-Medical): No  Physical Activity: Not on file  Stress: Not on file  Social Connections: Not on file  Intimate Partner Violence: Not At Risk (03/13/2022)   Humiliation, Afraid, Rape, and Kick questionnaire    Fear of Current or Ex-Partner: No    Emotionally Abused: No    Physically Abused: No    Sexually Abused: No    Past Surgical History:  Procedure Laterality Date   CHOLECYSTECTOMY  2018    COLONOSCOPY WITH PROPOFOL N/A 05/19/2019   Procedure: COLONOSCOPY WITH PROPOFOL;  Surgeon: Wyline Mood, MD;  Location: [facility];  Service: Endoscopy;  Laterality: N/A;  Priority 4   POLYPECTOMY  05/19/2019   Procedure: POLYPECTOMY;  Surgeon: Wyline Mood, MD;  Location: [facility];  Service: Endoscopy;;   ROBOT ASSISTED LAPAROSCOPIC RADICAL PROSTATECTOMY Bilateral 08/17/2019   Procedure: XI ROBOTIC ASSISTED LAPAROSCOPIC PROSTATECTOMY, BILATERAL LYMPH NODE DISSECTION;  Surgeon: Vanna Scotland, MD;  Location: [facility];  Service: Urology;  Laterality: Bilateral;    Family History  Problem Relation Age of Onset   Lung cancer Mother 5   Diabetes Paternal Grandmother    Colon cancer Sister 34    No Known Allergies  Current Outpatient Medications on File Prior to Visit  Medication Sig Dispense Refill   aspirin EC 81 MG tablet Take 1 tablet (81 mg total) by mouth daily. Swallow whole. 30 tablet 12   levothyroxine (SYNTHROID) 175 MCG tablet Take 1 tablet 4 days weekly and 1/2 tablet 3 days weekly by mouth every morning on an empty stomach with water only.  No food or other medications for 30 minutes. 90 tablet 0   atorvastatin (LIPITOR) 40 MG tablet Take 1 tablet (40 mg total) by mouth daily. (Patient not taking: Reported on 10/27/2022) 31 tablet 0   NONFORMULARY OR COMPOUNDED ITEM Trimix (30/1/10)-(Pap/Phent/PGE)  Dosage: Inject 0.3-0.7  cc per injection  Vial 1ml  Qty #5 Refills 6  [facility] [phone] Fax [phone] (Patient not taking: Reported on 10/27/2022) 3 each 0   sildenafil (REVATIO) 20 MG tablet Take 1-5 tablets (20-100 mg total) by mouth 3 (three) times daily. (Patient not taking: Reported on 10/27/2022) 30 tablet 3   No current facility-administered medications on file prior to visit.    BP 138/62   Pulse 87   Temp 97.6 F (36.4 C) (Temporal)   Ht 5\' 5"  (1.651 m)   Wt 157 lb (71.2 kg)   SpO2 98%   BMI 26.13 kg/m  Objective:   Physical  Exam Constitutional:      General: He is not in acute distress.    Appearance: He is not ill-appearing.  Cardiovascular:     Rate and Rhythm: Normal rate and regular rhythm.  Pulmonary:     Effort: Pulmonary effort is normal.  Abdominal:     General: Bowel sounds are normal.     Palpations: Abdomen is soft.     Tenderness: There is no abdominal tenderness.  Musculoskeletal:     Cervical back: Neck supple.  Skin:    General: Skin is warm and dry.  Neurological:     Mental Status: He is alert and oriented to person, place, and time.  Psychiatric:  Mood and Affect: Mood normal.           Assessment & Plan:  Pain of upper abdomen Assessment & Plan: Unclear etiology.  Unfortunately, he no showed his GI appointment for last month. Discussed to call to reschedule, phone number provided.  Reviewed labs from last visit which were grossly negative.  Checking labs today including H. pylori breath test, celiac panel, alpha gal, CBC with differential, TSH.  No alarm signs during HPI or exam, but given medical history I stressed the importance of GI follow up. ED precautions provided.   Orders: -     Alpha-Gal Panel -     Celiac Pnl 2 rflx Endomysial Ab Ttr -     H. pylori breath test  Hypothyroidism, unspecified type -     TSH        Doreene Nest, NP

## 2022-10-27 NOTE — Assessment & Plan Note (Addendum)
Unclear etiology.  Unfortunately, he no showed his GI appointment for last month. Discussed to call to reschedule, phone number provided.  Reviewed labs from last visit which were grossly negative.  Checking labs today including H. pylori breath test, celiac panel, alpha gal, CBC with differential, TSH.  No alarm signs during HPI or exam, but given medical history I stressed the importance of GI follow up. ED precautions provided.

## 2022-10-27 NOTE — Patient Instructions (Addendum)
Stop by the lab prior to leaving today. I will notify you of your results once received.   Call [facility] to schedule your appointment as discussed.  It was a pleasure to see you today!

## 2022-10-28 ENCOUNTER — Telehealth: Payer: Self-pay | Admitting: Primary Care

## 2022-10-28 NOTE — Telephone Encounter (Signed)
Please notify patient that I am waiting on his labs to return.  We will be in touch as soon as I receive all of his results.  Please remind him to call GI as we discussed during his visit.

## 2022-10-28 NOTE — Telephone Encounter (Signed)
Patient called to inquire about results from labs yesterday. States she is unable to get in his mychart and would like a call once labs are reviewed, can be reached at [phone]

## 2022-10-29 ENCOUNTER — Other Ambulatory Visit: Payer: BC Managed Care – PPO

## 2022-10-29 NOTE — Telephone Encounter (Signed)
Pt called for update on lab results? Told pt Tony Hernandez's response. Pt states he has a GI appt for the end of Sept. Call back # [phone]

## 2022-10-30 ENCOUNTER — Other Ambulatory Visit: Payer: Self-pay | Admitting: Primary Care

## 2022-10-30 DIAGNOSIS — A048 Other specified bacterial intestinal infections: Secondary | ICD-10-CM

## 2022-10-30 MED ORDER — METRONIDAZOLE 250 MG PO TABS: 250.0000 mg | ORAL_TABLET | Freq: Four times a day (QID) | ORAL | 0 refills | Status: DC

## 2022-10-30 MED ORDER — TETRACYCLINE HCL 500 MG PO CAPS: 500.0000 mg | ORAL_CAPSULE | Freq: Four times a day (QID) | ORAL | 0 refills | Status: DC

## 2022-10-30 MED ORDER — BISMUTH 262 MG PO CHEW: 524.0000 mg | CHEWABLE_TABLET | Freq: Four times a day (QID) | ORAL | 0 refills | Status: AC

## 2022-10-30 MED ORDER — OMEPRAZOLE 20 MG PO CPDR
20.0000 mg | DELAYED_RELEASE_CAPSULE | Freq: Two times a day (BID) | ORAL | 0 refills | Status: AC
Start: 2022-10-30 — End: ?

## 2022-11-09 LAB — ALPHA-GAL PANEL: CLASS: 0

## 2022-12-17 NOTE — Progress Notes (Signed)
Celso Amy, PA-C [address]  [address]  [place], [place] [address]  Main: [phone]  Fax: [phone]   Gastroenterology Consultation  Referring Provider:     Doreene Nest, NP Primary Care Physician:  Doreene Nest, NP Primary Gastroenterologist:  Celso Amy, PA-C / Dr. Wyline Mood   Reason for Consultation:     Abdominal pain, Repeat Colonoscopy        HPI:   Tony Hernandez is a 68 y.o. y/o male referred for consultation & management  by Doreene Nest, NP.    He has history of multiple large tubular adenoma polyps removed on colonoscopy 04/2019 with high-grade and low-grade dysplasia present.  He underwent right hemicolectomy in June 2021.  Lost to follow-up after that.  No repeat colonoscopy since then.  He has a family history of colon cancer in his sister.  He is overdue for repeat surveillance colonoscopy.  He was having a lot of upper abdominal pain.  He saw his PCP.  Had positive H. pylori breath test 10/27/2022.  Treated with meds x 14 days: Bismuth sucitrate, Tetracycline, Metronidazole, Omeprazole. He completed all therapy and is feeling a lot better.  Upper abdominal pain improved.  No longer on PPI.  No previous EGD.  He was having diarrhea.  Alpha gal, celiac panel, and TSH were normal.  C. difficile stool test, GI pathogen panel negative 08/2022.  Diarrhea has currently improved.  He denies rectal bleeding.   Past Medical History:  Diagnosis Date   Arthritis    knees   Generalized abdominal pain 06/19/2021   GERD (gastroesophageal reflux disease)    Microscopic hematuria 09/03/2021   Thyroid disease    Wears dentures     Past Surgical History:  Procedure Laterality Date   CHOLECYSTECTOMY  2018   COLONOSCOPY WITH PROPOFOL N/A 05/19/2019   Procedure: COLONOSCOPY WITH PROPOFOL;  Surgeon: Wyline Mood, MD;  Location: [facility];  Service: Endoscopy;  Laterality: N/A;  Priority 4   POLYPECTOMY  05/19/2019   Procedure: POLYPECTOMY;   Surgeon: Wyline Mood, MD;  Location: [facility];  Service: Endoscopy;;   ROBOT ASSISTED LAPAROSCOPIC RADICAL PROSTATECTOMY Bilateral 08/17/2019   Procedure: XI ROBOTIC ASSISTED LAPAROSCOPIC PROSTATECTOMY, BILATERAL LYMPH NODE DISSECTION;  Surgeon: Vanna Scotland, MD;  Location: [facility];  Service: Urology;  Laterality: Bilateral;    Prior to Admission medications   Medication Sig Start Date End Date Taking? Authorizing Provider  aspirin EC 81 MG tablet Take 1 tablet (81 mg total) by mouth daily. Swallow whole. 03/14/22   Reva Bores, MD  atorvastatin (LIPITOR) 40 MG tablet Take 1 tablet (40 mg total) by mouth daily. Patient not taking: Reported on 10/27/2022 03/14/22 04/14/22  Reva Bores, MD  levothyroxine (SYNTHROID) 175 MCG tablet Take 1 tablet 4 days weekly and 1/2 tablet 3 days weekly by mouth every morning on an empty stomach with water only.  No food or other medications for 30 minutes. 10/12/22   Doreene Nest, NP  metroNIDAZOLE (FLAGYL) 250 MG tablet Take 1 tablet (250 mg total) by mouth 4 (four) times daily. For infection 10/30/22   Doreene Nest, NP  NONFORMULARY OR COMPOUNDED ITEM Trimix (30/1/10)-(Pap/Phent/PGE)  Dosage: Inject 0.3-0.7  cc per injection  Vial 1ml  Qty #5 Refills 6  [facility] [phone] Fax [phone] Patient not taking: Reported on 10/27/2022 06/05/21   Michiel Cowboy A, PA-C  omeprazole (PRILOSEC) 20 MG capsule Take 1 capsule (20 mg total) by  mouth 2 (two) times daily before a meal. For infection 10/30/22   Doreene Nest, NP  sildenafil (REVATIO) 20 MG tablet Take 1-5 tablets (20-100 mg total) by mouth 3 (three) times daily. Patient not taking: Reported on 10/27/2022 01/09/21   Vanna Scotland, MD  tetracycline (SUMYCIN) 500 MG capsule Take 1 capsule (500 mg total) by mouth 4 (four) times daily. For infection 10/30/22   Doreene Nest, NP    Family History  Problem Relation Age of Onset   Lung cancer Mother 46    Diabetes Paternal Grandmother    Colon cancer Sister 28     Social History   Tobacco Use   Smoking status: Former    Current packs/day: 0.00    Types: Cigarettes    Start date: 06/22/1991    Quit date: 06/21/2001    Years since quitting: 21.5   Smokeless tobacco: Never  Vaping Use   Vaping status: Never Used  Substance Use Topics   Alcohol use: No    Alcohol/week: 0.0 standard drinks of alcohol    Comment: quit 18 years ago   Drug use: Not Currently    Types: Cocaine, Marijuana    Comment: quit 18 years ago     Allergies as of 12/18/2022   (No Known Allergies)    Review of Systems:    All systems reviewed and negative except where noted in HPI.   Physical Exam:  BP (!) 137/93   Pulse 84   Temp 98 F (36.7 C)   Ht 5\' 5"  (1.651 m)   Wt 160 lb 12.8 oz (72.9 kg)   BMI 26.76 kg/m  No LMP for male patient.  General:   Alert,  Well-developed, well-nourished, pleasant and cooperative in NAD Lungs:  Respirations even and unlabored.  Clear throughout to auscultation.   No wheezes, crackles, or rhonchi. No acute distress. Heart:  Regular rate and rhythm; no murmurs, clicks, rubs, or gallops. Abdomen:  Normal bowel sounds.  No bruits.  Soft, and non-distended without masses, hepatosplenomegaly or hernias noted.  No Tenderness.  No guarding or rebound tenderness.    Neurologic:  Alert and oriented x3;  grossly normal neurologically. Psych:  Alert and cooperative. Normal mood and affect.  Imaging Studies: No results found.  Assessment and Plan:   Tony Hernandez is a 67 y.o. y/o male has been referred for abdominal pain and repeat colonoscopy.  History of large tubular adenoma polyps found during colonoscopy 04/2019 with high-grade and low-grade dysplasia.  Underwent right hemicolectomy 08/2019.  Is overdue for a repeat surveillance colonoscopy.  Was recently treated for positive H. pylori breath test with quadruple therapy.  Currently feeling better.  1.  History of large  adenomatous polyps with high-grade dysplasia s/p right hemicolectomy.  Scheduling Colonoscopy I discussed risks of colonoscopy with patient to include risk of bleeding, colon perforation, and risk of sedation.  Patient expressed understanding and agrees to proceed with colonoscopy.   2.  Family history of colon cancer in his sister  I instructed patient he needs repeat colonoscopy at least every 5 years.  3.  H. pylori infection; symptoms improved post treatment with bismuth subsalicylate, metronidazole, tetracycline, and PPI.    No previous EGD.  Scheduling EGD I discussed risks of EGD with patient to include risk of bleeding, perforation, and risk of sedation.  Patient expressed understanding and agrees to proceed with EGD.   4.  Diarrhea -currently resolved.  Recent celiac panel, alpha gal, C. difficile, and GI pathogen  panel were negative since June 2024.  Reassurance.  Follow up as needed based on Colonoscopy and EGD results.  Follow-up if he has recurrent GI symptoms.  Celso Amy, PA-C

## 2022-12-18 ENCOUNTER — Encounter: Payer: Self-pay | Admitting: Physician Assistant

## 2022-12-18 ENCOUNTER — Ambulatory Visit: Payer: BC Managed Care – PPO | Admitting: Physician Assistant

## 2022-12-18 VITALS — BP 137/93 | HR 84 | Temp 98.0°F | Ht 65.0 in | Wt 160.8 lb

## 2022-12-18 DIAGNOSIS — Z8619 Personal history of other infectious and parasitic diseases: Secondary | ICD-10-CM

## 2022-12-18 DIAGNOSIS — Z860100 Personal history of colon polyps, unspecified: Secondary | ICD-10-CM

## 2022-12-18 MED ORDER — PEG 3350-KCL-NA BICARB-NACL 420 G PO SOLR: 4000.0000 mL | Freq: Once | ORAL | 0 refills | Status: AC

## 2023-01-07 ENCOUNTER — Ambulatory Visit: Payer: 59 | Admitting: Physician Assistant

## 2023-01-07 NOTE — Telephone Encounter (Signed)
error 

## 2023-01-20 ENCOUNTER — Other Ambulatory Visit: Payer: Self-pay

## 2023-01-20 MED ORDER — PEG 3350-KCL-NA BICARB-NACL 420 G PO SOLR: ORAL | 0 refills | Status: DC

## 2023-01-21 ENCOUNTER — Ambulatory Visit
Admission: RE | Admit: 2023-01-21 | Payer: BC Managed Care – PPO | Source: Home / Self Care | Admitting: Gastroenterology

## 2023-01-21 ENCOUNTER — Encounter: Admission: RE | Payer: Self-pay | Source: Home / Self Care

## 2023-01-21 SURGERY — COLONOSCOPY WITH PROPOFOL
Anesthesia: General

## 2023-02-10 ENCOUNTER — Ambulatory Visit: Payer: BC Managed Care – PPO | Admitting: Primary Care

## 2023-02-10 ENCOUNTER — Encounter: Payer: Self-pay | Admitting: Primary Care

## 2023-02-10 VITALS — BP 128/74 | HR 64 | Temp 97.8°F | Ht 65.0 in | Wt 163.0 lb

## 2023-02-10 DIAGNOSIS — E039 Hypothyroidism, unspecified: Secondary | ICD-10-CM

## 2023-02-10 DIAGNOSIS — R350 Frequency of micturition: Secondary | ICD-10-CM | POA: Insufficient documentation

## 2023-02-10 DIAGNOSIS — R7303 Prediabetes: Secondary | ICD-10-CM

## 2023-02-10 DIAGNOSIS — Z8546 Personal history of malignant neoplasm of prostate: Secondary | ICD-10-CM | POA: Diagnosis not present

## 2023-02-10 HISTORY — DX: Frequency of micturition: R35.0

## 2023-02-10 LAB — URINALYSIS, MICROSCOPIC ONLY

## 2023-02-10 LAB — POC URINALSYSI DIPSTICK (AUTOMATED)
Bilirubin, UA: NEGATIVE
Blood, UA: POSITIVE
Glucose, UA: NEGATIVE
Ketones, UA: NEGATIVE
Leukocytes, UA: NEGATIVE
Nitrite, UA: NEGATIVE
Protein, UA: NEGATIVE
Spec Grav, UA: 1.02 (ref 1.010–1.025)
Urobilinogen, UA: 0.2 U/dL
pH, UA: 5 (ref 5.0–8.0)

## 2023-02-10 LAB — PSA: PSA: 0 ng/mL — ABNORMAL LOW (ref 0.10–4.00)

## 2023-02-10 LAB — HEMOGLOBIN A1C: Hgb A1c MFr Bld: 6.3 % (ref 4.6–6.5)

## 2023-02-10 LAB — TSH: TSH: 0.24 u[IU]/mL — ABNORMAL LOW (ref 0.35–5.50)

## 2023-02-10 NOTE — Progress Notes (Signed)
Subjective:    Patient ID: Tony Hernandez, male    DOB: 1956-03-08, 67 y.o.   MRN: [id number]  Urinary Frequency  Associated symptoms include frequency. Pertinent negatives include no hematuria.    Tony Hernandez is a very pleasant 67 y.o. male with a history of hypothyroidism, prostate cancer, CVA, prediabetes, right colectomy who presents today for follow-up of hypothyroidism and to discuss urinary frequency.  1) Hypothyroidism: Currently managed on levothyroxine 175 mcg 4 days weekly and 87.5 mcg 3 days weekly.   He is taking levothyroxine every morning on an empty stomach with water only.   No food or other medications for 30 minutes.   No heartburn medication, iron pills, calcium, vitamin D, or magnesium pills within four hours of taking levothyroxine.   His last TSH was 0.12 in August 2024.  2) Urinary Frequency: Symptom onset this morning with bladder pressure with short bursts and urine flow. This has occurred about 6 times today.   He denies hematuria, flank pain, groin pain, dysuria, penile discharge.   History of prostate cancer with bilateral prostatectomy last PSA was in May 2024 which was 0.01.  History of prediabetes, last A1C was 6.3 in May 2024.  Review of Systems  Constitutional:  Negative for fever.  Gastrointestinal:  Negative for abdominal pain.  Genitourinary:  Positive for frequency. Negative for dysuria, hematuria, penile discharge, penile pain, penile swelling, scrotal swelling and testicular pain.         Past Medical History:  Diagnosis Date   Arthritis    knees   Generalized abdominal pain 06/19/2021   GERD (gastroesophageal reflux disease)    Microscopic hematuria 09/03/2021   Thyroid disease    Wears dentures     Social History   Socioeconomic History   Marital status: Married    Spouse name: Not on file   Number of children: Not on file   Years of education: Not on file   Highest education level: Not on file  Occupational History    Not on file  Tobacco Use   Smoking status: Former    Current packs/day: 0.00    Types: Cigarettes    Start date: 06/22/1991    Quit date: 06/21/2001    Years since quitting: 21.6   Smokeless tobacco: Never  Vaping Use   Vaping status: Never Used  Substance and Sexual Activity   Alcohol use: No    Alcohol/week: 0.0 standard drinks of alcohol    Comment: quit 18 years ago   Drug use: Not Currently    Types: Cocaine, Marijuana    Comment: quit 18 years ago    Sexual activity: Yes    Birth control/protection: None  Other Topics Concern   Not on file  Social History Narrative   Single.   2 children.   Works as a Naval architect.   Enjoys spending time with family.   Social Determinants of Health   Financial Resource Strain: Not on file  Food Insecurity: No Food Insecurity (03/13/2022)   Hunger Vital Sign    Worried About Running Out of Food in the Last Year: Never true    Ran Out of Food in the Last Year: Never true  Transportation Needs: No Transportation Needs (03/13/2022)   PRAPARE - Administrator, Civil Service (Medical): No    Lack of Transportation (Non-Medical): No  Physical Activity: Not on file  Stress: Not on file  Social Connections: Not on file  Intimate Partner Violence: Not At  Risk (03/13/2022)   Humiliation, Afraid, Rape, and Kick questionnaire    Fear of Current or Ex-Partner: No    Emotionally Abused: No    Physically Abused: No    Sexually Abused: No    Past Surgical History:  Procedure Laterality Date   CHOLECYSTECTOMY  2018   COLONOSCOPY WITH PROPOFOL N/A 05/19/2019   Procedure: COLONOSCOPY WITH PROPOFOL;  Surgeon: Wyline Mood, MD;  Location: [facility];  Service: Endoscopy;  Laterality: N/A;  Priority 4   POLYPECTOMY  05/19/2019   Procedure: POLYPECTOMY;  Surgeon: Wyline Mood, MD;  Location: [facility];  Service: Endoscopy;;   ROBOT ASSISTED LAPAROSCOPIC RADICAL PROSTATECTOMY Bilateral 08/17/2019   Procedure: XI ROBOTIC  ASSISTED LAPAROSCOPIC PROSTATECTOMY, BILATERAL LYMPH NODE DISSECTION;  Surgeon: Vanna Scotland, MD;  Location: [facility];  Service: Urology;  Laterality: Bilateral;    Family History  Problem Relation Age of Onset   Lung cancer Mother 100   Diabetes Paternal Grandmother    Colon cancer Sister 75    No Known Allergies  Current Outpatient Medications on File Prior to Visit  Medication Sig Dispense Refill   aspirin EC 81 MG tablet Take 1 tablet (81 mg total) by mouth daily. Swallow whole. 30 tablet 12   levothyroxine (SYNTHROID) 175 MCG tablet Take 1 tablet 4 days weekly and 1/2 tablet 3 days weekly by mouth every morning on an empty stomach with water only.  No food or other medications for 30 minutes. 90 tablet 0   polyethylene glycol-electrolytes (NULYTELY) 420 g solution Prepare according to package instructions. Starting at 5:00 PM: Drink one 8 oz glass of mixture every 15 minutes until you finish half of the jug. Five hours prior to procedure, drink 8 oz glass of mixture every 15 minutes until it is all gone. Make sure you do not drink anything 4 hours prior to your procedure. 4000 mL 0   No current facility-administered medications on file prior to visit.    BP 128/74   Pulse 64   Temp 97.8 F (36.6 C) (Temporal)   Ht 5\' 5"  (1.651 m)   Wt 163 lb (73.9 kg)   SpO2 100%   BMI 27.12 kg/m  Objective:   Physical Exam Cardiovascular:     Rate and Rhythm: Normal rate and regular rhythm.  Pulmonary:     Effort: Pulmonary effort is normal.     Breath sounds: Normal breath sounds.  Musculoskeletal:     Cervical back: Neck supple.  Skin:    General: Skin is warm and dry.  Neurological:     Mental Status: He is alert and oriented to person, place, and time.  Psychiatric:        Mood and Affect: Mood normal.           Assessment & Plan:  Urinary frequency Assessment & Plan: Unclear etiology.  Differentials include renal stones, cystitis, hyperglycemia. Chart review  reveals complete prostatectomy so BPH is not on the differential list.  Urinalysis with trace blood. Urine microscopic pending. No obvious infection.   A1c and PSA pending.  Orders: -     POCT Urinalysis Dipstick (Automated) -     PSA -     Urine Microscopic  History of prostate cancer Assessment & Plan: Reviewed PSA level from May 2024 which was undetectable. Repeat PSA level pending.  Orders: -     PSA  Hypothyroidism, unspecified type Assessment & Plan: He is taking levothyroxine correctly.  Continue levothyroxine 175 mcg tablets 4 days weekly  and 87.5 mcg tablets 3 days weekly. Repeat TSH level pending.  Orders: -     TSH  Prediabetes Assessment & Plan: Repeat A1c pending, especially in the setting of urinary frequency.  Orders: -     Hemoglobin A1c        Doreene Nest, NP

## 2023-02-10 NOTE — Assessment & Plan Note (Signed)
Repeat A1c pending, especially in the setting of urinary frequency.

## 2023-02-10 NOTE — Patient Instructions (Addendum)
Stop by the lab prior to leaving today. I will notify you of your results once received.   It was a pleasure to see you today!  

## 2023-02-10 NOTE — Assessment & Plan Note (Signed)
He is taking levothyroxine correctly.  Continue levothyroxine 175 mcg tablets 4 days weekly and 87.5 mcg tablets 3 days weekly. Repeat TSH level pending.

## 2023-02-10 NOTE — Assessment & Plan Note (Signed)
Unclear etiology.  Differentials include renal stones, cystitis, hyperglycemia. Chart review reveals complete prostatectomy so BPH is not on the differential list.  Urinalysis with trace blood. Urine microscopic pending. No obvious infection.   A1c and PSA pending.

## 2023-02-10 NOTE — Assessment & Plan Note (Signed)
Reviewed PSA level from May 2024 which was undetectable. Repeat PSA level pending.

## 2023-02-11 ENCOUNTER — Other Ambulatory Visit: Payer: Self-pay | Admitting: Primary Care

## 2023-02-11 DIAGNOSIS — E039 Hypothyroidism, unspecified: Secondary | ICD-10-CM

## 2023-02-11 MED ORDER — LEVOTHYROXINE SODIUM 175 MCG PO TABS: ORAL_TABLET | ORAL | 0 refills | Status: DC

## 2023-02-22 ENCOUNTER — Ambulatory Visit
Admission: RE | Admit: 2023-02-22 | Payer: BC Managed Care – PPO | Source: Home / Self Care | Admitting: Gastroenterology

## 2023-02-22 SURGERY — ESOPHAGOGASTRODUODENOSCOPY (EGD) WITH PROPOFOL
Anesthesia: General

## 2023-04-14 ENCOUNTER — Other Ambulatory Visit: Payer: BC Managed Care – PPO

## 2023-05-13 ENCOUNTER — Other Ambulatory Visit: Payer: Self-pay | Admitting: Primary Care

## 2023-05-13 DIAGNOSIS — E039 Hypothyroidism, unspecified: Secondary | ICD-10-CM

## 2023-05-13 MED ORDER — LEVOTHYROXINE SODIUM 175 MCG PO TABS: ORAL_TABLET | ORAL | 0 refills | Status: DC

## 2023-05-13 NOTE — Telephone Encounter (Signed)
 Refills sent to pharmacy.

## 2023-05-13 NOTE — Telephone Encounter (Signed)
Copied from CRM [id number]. Topic: Clinical - Medication Refill >> May 13, 2023  8:46 AM Marica Otter wrote: Most Recent Primary Care Visit:  Provider: Doreene Nest  Department: [facility]  Visit Type: OFFICE VISIT  Date: 02/10/2023  Medication: levothyroxine (SYNTHROID) 175 MCG tablet  Has the patient contacted their pharmacy? No, states pharmacy tells him to call provider (Agent: If no, request that the patient contact the pharmacy for the refill. If patient does not wish to contact the pharmacy document the reason why and proceed with request.) (Agent: If yes, when and what did the pharmacy advise?)  Is this the correct pharmacy for this prescription? Yes If no, delete pharmacy and type the correct one.  This is the patient's preferred pharmacy:  [phone], [place] - [address] [place] [address] Phone: [phone] Fax: [phone]   Has the prescription been filled recently? No  Is the patient out of the medication? No  Has the patient been seen for an appointment in the last year OR does the patient have an upcoming appointment? Yes  Can we respond through MyChart? No  Agent: Please be advised that Rx refills may take up to 3 business days. We ask that you follow-up with your pharmacy.

## 2023-05-17 ENCOUNTER — Ambulatory Visit (INDEPENDENT_AMBULATORY_CARE_PROVIDER_SITE_OTHER): Payer: BC Managed Care – PPO | Admitting: Primary Care

## 2023-05-17 ENCOUNTER — Encounter: Payer: Self-pay | Admitting: Primary Care

## 2023-05-17 VITALS — BP 138/82 | HR 78 | Temp 97.2°F | Ht 65.0 in | Wt 174.0 lb

## 2023-05-17 DIAGNOSIS — E039 Hypothyroidism, unspecified: Secondary | ICD-10-CM | POA: Diagnosis not present

## 2023-05-17 DIAGNOSIS — R7303 Prediabetes: Secondary | ICD-10-CM | POA: Diagnosis not present

## 2023-05-17 DIAGNOSIS — Z8673 Personal history of transient ischemic attack (TIA), and cerebral infarction without residual deficits: Secondary | ICD-10-CM | POA: Diagnosis not present

## 2023-05-17 DIAGNOSIS — Z0000 Encounter for general adult medical examination without abnormal findings: Secondary | ICD-10-CM | POA: Diagnosis not present

## 2023-05-17 DIAGNOSIS — E782 Mixed hyperlipidemia: Secondary | ICD-10-CM

## 2023-05-17 DIAGNOSIS — Z8546 Personal history of malignant neoplasm of prostate: Secondary | ICD-10-CM

## 2023-05-17 LAB — COMPREHENSIVE METABOLIC PANEL
ALT: 37 U/L (ref 0–53)
AST: 20 U/L (ref 0–37)
Albumin: 4.2 g/dL (ref 3.5–5.2)
Alkaline Phosphatase: 57 U/L (ref 39–117)
BUN: 12 mg/dL (ref 6–23)
CO2: 29 meq/L (ref 19–32)
Calcium: 9.7 mg/dL (ref 8.4–10.5)
Chloride: 105 meq/L (ref 96–112)
Creatinine, Ser: 1.19 mg/dL (ref 0.40–1.50)
GFR: 64.01 mL/min (ref >60.00)
Glucose, Bld: 109 mg/dL — ABNORMAL HIGH (ref 70–99)
Potassium: 5.1 meq/L (ref 3.5–5.1)
Sodium: 142 meq/L (ref 135–145)
Total Bilirubin: 0.3 mg/dL (ref 0.2–1.2)
Total Protein: 6.9 g/dL (ref 6.0–8.3)

## 2023-05-17 LAB — LIPID PANEL
Cholesterol: 180 mg/dL (ref 0–200)
HDL: 50.8 mg/dL (ref >39.00)
LDL Cholesterol: 110 mg/dL — ABNORMAL HIGH (ref 0–99)
NonHDL: 129.63
Total CHOL/HDL Ratio: 4
Triglycerides: 99 mg/dL (ref 0.0–149.0)
VLDL: 19.8 mg/dL (ref 0.0–40.0)

## 2023-05-17 LAB — CBC
HCT: 41.4 % (ref 39.0–52.0)
Hemoglobin: 13.1 g/dL (ref 13.0–17.0)
MCHC: 31.7 g/dL (ref 30.0–36.0)
MCV: 76.9 fL — ABNORMAL LOW (ref 78.0–100.0)
Platelets: 350 10*3/uL (ref 150.0–400.0)
RBC: 5.38 Mil/uL (ref 4.22–5.81)
RDW: 14.7 % (ref 11.5–15.5)
WBC: 7.8 10*3/uL (ref 4.0–10.5)

## 2023-05-17 LAB — TSH: TSH: 8.52 u[IU]/mL — ABNORMAL HIGH (ref 0.35–5.50)

## 2023-05-17 LAB — HEMOGLOBIN A1C: Hgb A1c MFr Bld: 6.6 % — ABNORMAL HIGH (ref 4.6–6.5)

## 2023-05-17 NOTE — Assessment & Plan Note (Signed)
 Repeat lipid panel pending.  Unclear why off statin therapy. Will likely resume pending results.

## 2023-05-17 NOTE — Progress Notes (Signed)
 Subjective:    Patient ID: Tony Hernandez, male    DOB: 26-Jun-1955, 68 y.o.   MRN: [id number]  HPI  Tony Hernandez is a very pleasant 68 y.o. male who presents today for complete physical and follow up of chronic conditions.  Immunizations: -Tetanus: Completed in 2021 -Influenza: Declines influenza vaccine.  -Shingles: Completed Shingrix series  Diet: Fair diet.  Exercise: Active at work.   Eye exam: Completes annually  Dental exam: Completed several years ago   Colonoscopy: Completed in February 2021, due August 2021 but never completed. Cannot afford at this time.   PSA: Due   BP Readings from Last 3 Encounters:  05/17/23 138/82  02/10/23 128/74  12/18/22 (!) 137/93        Review of Systems  Constitutional:  Negative for unexpected weight change.  HENT:  Negative for rhinorrhea.   Respiratory:  Negative for cough and shortness of breath.   Cardiovascular:  Negative for chest pain.  Gastrointestinal:  Negative for constipation and diarrhea.  Genitourinary:  Negative for difficulty urinating.  Musculoskeletal:  Negative for arthralgias and myalgias.  Skin:  Negative for rash.  Allergic/Immunologic: Negative for environmental allergies.  Neurological:  Negative for dizziness and headaches.  Psychiatric/Behavioral:  The patient is not nervous/anxious.          Past Medical History:  Diagnosis Date   Abdominal pain 06/19/2021   Acute diarrhea 08/27/2022   Arthritis    knees   Generalized abdominal pain 06/19/2021   GERD (gastroesophageal reflux disease)    Microscopic hematuria 09/03/2021   Thyroid disease    Urinary frequency 02/10/2023   Wears dentures     Social History   Socioeconomic History   Marital status: Married    Spouse name: Not on file   Number of children: Not on file   Years of education: Not on file   Highest education level: Not on file  Occupational History   Not on file  Tobacco Use   Smoking status: Former    Current  packs/day: 0.00    Types: Cigarettes    Start date: 06/22/1991    Quit date: 06/21/2001    Years since quitting: 21.9   Smokeless tobacco: Never  Vaping Use   Vaping status: Never Used  Substance and Sexual Activity   Alcohol use: No    Alcohol/week: 0.0 standard drinks of alcohol    Comment: quit 18 years ago   Drug use: Not Currently    Types: Cocaine, Marijuana    Comment: quit 18 years ago    Sexual activity: Yes    Birth control/protection: None  Other Topics Concern   Not on file  Social History Narrative   Single.   2 children.   Works as a Naval architect.   Enjoys spending time with family.   Social Drivers of Corporate investment banker Strain: Not on file  Food Insecurity: No Food Insecurity (03/13/2022)   Hunger Vital Sign    Worried About Running Out of Food in the Last Year: Never true    Ran Out of Food in the Last Year: Never true  Transportation Needs: No Transportation Needs (03/13/2022)   PRAPARE - Administrator, Civil Service (Medical): No    Lack of Transportation (Non-Medical): No  Physical Activity: Not on file  Stress: Not on file  Social Connections: Not on file  Intimate Partner Violence: Not At Risk (03/13/2022)   Humiliation, Afraid, Rape, and Kick questionnaire  Fear of Current or Ex-Partner: No    Emotionally Abused: No    Physically Abused: No    Sexually Abused: No    Past Surgical History:  Procedure Laterality Date   CHOLECYSTECTOMY  2018   COLONOSCOPY WITH PROPOFOL N/A 05/19/2019   Procedure: COLONOSCOPY WITH PROPOFOL;  Surgeon: Wyline Mood, MD;  [facility];  Service: Endoscopy;  Laterality: N/A;  Priority 4   POLYPECTOMY  05/19/2019   Procedure: POLYPECTOMY;  Surgeon: Wyline Mood, MD;  Location: [facility];  Service: Endoscopy;;   ROBOT ASSISTED LAPAROSCOPIC RADICAL PROSTATECTOMY Bilateral 08/17/2019   Procedure: XI ROBOTIC ASSISTED LAPAROSCOPIC PROSTATECTOMY, BILATERAL LYMPH NODE DISSECTION;   Surgeon: Vanna Scotland, MD;  Location: [facility];  Service: Urology;  Laterality: Bilateral;    Family History  Problem Relation Age of Onset   Lung cancer Mother 60   Diabetes Paternal Grandmother    Colon cancer Sister 60    No Known Allergies  Current Outpatient Medications on File Prior to Visit  Medication Sig Dispense Refill   levothyroxine (SYNTHROID) 175 MCG tablet Take 1 tablet 3 days weekly and 1/2 tablet 4 days weekly by mouth every morning on an empty stomach with water only.  No food or other medications for 30 minutes. 60 tablet 0   aspirin EC 81 MG tablet Take 1 tablet (81 mg total) by mouth daily. Swallow whole. (Patient not taking: Reported on 05/17/2023) 30 tablet 12   No current facility-administered medications on file prior to visit.    BP 138/82   Pulse 78   Temp (!) 97.2 F (36.2 C) (Temporal)   Ht 5\' 5"  (1.651 m)   Wt 174 lb (78.9 kg)   SpO2 100%   BMI 28.96 kg/m  Objective:   Physical Exam HENT:     Right Ear: Tympanic membrane and ear canal normal.     Left Ear: Tympanic membrane and ear canal normal.  Eyes:     Pupils: Pupils are equal, round, and reactive to light.  Cardiovascular:     Rate and Rhythm: Normal rate and regular rhythm.  Pulmonary:     Effort: Pulmonary effort is normal.     Breath sounds: Normal breath sounds.  Abdominal:     General: Bowel sounds are normal.     Palpations: Abdomen is soft.     Tenderness: There is no abdominal tenderness.  Musculoskeletal:        General: Normal range of motion.     Cervical back: Neck supple.  Skin:    General: Skin is warm and dry.  Neurological:     Mental Status: He is alert and oriented to person, place, and time.     Cranial Nerves: No cranial nerve deficit.     Deep Tendon Reflexes:     Reflex Scores:      Patellar reflexes are 2+ on the right side and 2+ on the left side. Psychiatric:        Mood and Affect: Mood normal.           Assessment & Plan:  Preventative  health care Assessment & Plan: Immunizations UTD.Declines influenza vaccine.  Colonoscopy overdue, he declines to complete right now due to finances. PSA UTD  Discussed the importance of a healthy diet and regular exercise in order for weight loss, and to reduce the risk of further co-morbidity.  Exam stable. Labs pending.  Follow up in 1 year for repeat physical.    Hypothyroidism, unspecified type Assessment &  Plan: He is taking levothyroxine correctly.  Continue 175 mcg 3 days weekly and 87.5 mcg 4 days weekly. Repeat TSH pending.  Orders: -     TSH  Prediabetes Assessment & Plan: Repeat A1c pending.  Orders: -     Hemoglobin A1c -     Lipid panel -     Comprehensive metabolic panel -     CBC  History of CVA (cerebrovascular accident) Assessment & Plan: No longer on statin therapy, unclear reason.  Repeat lipid panel pending. Continue aspirin 81 mg daily.  Orders: -     Lipid panel -     Comprehensive metabolic panel -     CBC  History of prostate cancer Assessment & Plan: Undetectable PSA from November 2024.   Mixed hyperlipidemia Assessment & Plan: Repeat lipid panel pending.  Unclear why off statin therapy. Will likely resume pending results.          Doreene Nest, NP

## 2023-05-17 NOTE — Assessment & Plan Note (Signed)
 Immunizations UTD.Declines influenza vaccine.  Colonoscopy overdue, he declines to complete right now due to finances. PSA UTD  Discussed the importance of a healthy diet and regular exercise in order for weight loss, and to reduce the risk of further co-morbidity.  Exam stable. Labs pending.  Follow up in 1 year for repeat physical.

## 2023-05-17 NOTE — Assessment & Plan Note (Signed)
 No longer on statin therapy, unclear reason.  Repeat lipid panel pending. Continue aspirin 81 mg daily.

## 2023-05-17 NOTE — Assessment & Plan Note (Signed)
 Repeat A1c pending

## 2023-05-17 NOTE — Assessment & Plan Note (Signed)
 Undetectable PSA from November 2024.

## 2023-05-17 NOTE — Assessment & Plan Note (Signed)
 He is taking levothyroxine correctly.  Continue 175 mcg 3 days weekly and 87.5 mcg 4 days weekly. Repeat TSH pending.

## 2023-05-18 ENCOUNTER — Other Ambulatory Visit: Payer: Self-pay | Admitting: Primary Care

## 2023-05-18 DIAGNOSIS — E039 Hypothyroidism, unspecified: Secondary | ICD-10-CM

## 2023-05-18 MED ORDER — LEVOTHYROXINE SODIUM 150 MCG PO TABS: ORAL_TABLET | ORAL | 0 refills | Status: DC

## 2023-05-20 ENCOUNTER — Telehealth: Payer: Self-pay | Admitting: Primary Care

## 2023-05-20 DIAGNOSIS — E782 Mixed hyperlipidemia: Secondary | ICD-10-CM

## 2023-05-20 MED ORDER — ATORVASTATIN CALCIUM 20 MG PO TABS: 20.0000 mg | ORAL_TABLET | Freq: Every day | ORAL | 3 refills | Status: AC

## 2023-05-20 NOTE — Telephone Encounter (Signed)
 Copied from CRM [id number]. Topic: Clinical - Prescription Issue >> May 20, 2023  9:23 AM Truddie Crumble wrote: Reason for CRM: patient called stating he need a refill on cholesterol medication that he stopped taking four months ago [facility] [place]

## 2023-05-20 NOTE — Telephone Encounter (Signed)
 Minute.  Prescription for atorvastatin 20 mg sent to pharmacy.

## 2023-05-20 NOTE — Addendum Note (Signed)
 Addended by: Doreene Nest on: 05/20/2023 01:11 PM   Modules accepted: Orders

## 2023-06-17 ENCOUNTER — Ambulatory Visit: Admitting: Primary Care

## 2023-07-16 ENCOUNTER — Other Ambulatory Visit: Payer: BC Managed Care – PPO

## 2023-07-16 DIAGNOSIS — E039 Hypothyroidism, unspecified: Secondary | ICD-10-CM

## 2023-07-16 LAB — TSH: TSH: 1.32 u[IU]/mL (ref 0.35–5.50)

## 2023-07-16 LAB — T4, FREE: Free T4: 0.83 ng/dL (ref 0.60–1.60)

## 2023-08-09 ENCOUNTER — Other Ambulatory Visit: Payer: Self-pay | Admitting: Primary Care

## 2023-08-09 DIAGNOSIS — E039 Hypothyroidism, unspecified: Secondary | ICD-10-CM

## 2023-08-09 MED ORDER — LEVOTHYROXINE SODIUM 150 MCG PO TABS
ORAL_TABLET | ORAL | 2 refills | Status: AC
Start: 2023-08-09 — End: ?

## 2023-08-09 NOTE — Telephone Encounter (Signed)
 Prescription Request  08/09/2023  LOV: 05/17/2023  What is the name of the medication or equipment? levothyroxine  (SYNTHROID ) 150 MCG tablet   Have you contacted your pharmacy to request a refill? No   Which pharmacy would you like this sent to?  [id number], [place] - [address] [place] [address] Phone: [phone] Fax: [phone]    Patient notified that their request is being sent to the clinical staff for review and that they should receive a response within 2 business days.   Patient has 7 or 8 left pills left, 90 day supply if possible  Please advise at [facility] [phone]

## 2023-09-28 ENCOUNTER — Telehealth: Payer: Self-pay | Admitting: Primary Care

## 2023-09-28 NOTE — Telephone Encounter (Signed)
 Patient states he needs a refill for his thyroid  medication, he has one left. The medication is levothyroxine  (synthroid ) 150 MCG tablet.  Patient would like to be reached at his telephone number at [phone] of when the medications are sent out to his local pharmacy  [id number] [place], [place] - [address] [place] [address] Phone: [phone]  Fax: [phone]

## 2023-09-28 NOTE — Telephone Encounter (Signed)
 Please call patient:  I sent 9 months worth of refills of levothyroxine  to his local pharmacy on 08/09/2023.  Please have him call his pharmacy for the refill.

## 2023-09-29 NOTE — Telephone Encounter (Signed)
 Called patient and reviewed all information. Patient verbalized understanding. Will call if any further questions.

## 2024-02-22 ENCOUNTER — Other Ambulatory Visit: Payer: Self-pay | Admitting: Primary Care

## 2024-02-22 DIAGNOSIS — E039 Hypothyroidism, unspecified: Secondary | ICD-10-CM

## 2024-02-22 NOTE — Telephone Encounter (Signed)
 Please call patient:  I sent a 9 month supply to pharmacy in May 2025. Have him call pharmacy for refill.

## 2024-02-22 NOTE — Telephone Encounter (Unsigned)
 Copied from CRM #[id number]. Topic: Clinical - Medication Refill >> Feb 22, 2024 10:31 AM Olam RAMAN wrote: Medication: levothyroxine  (SYNTHROID ) 150 MCG tablet   Has the patient contacted their pharmacy? No (Agent: If no, request that the patient contact the pharmacy for the refill. If patient does not wish to contact the pharmacy document the reason why and proceed with request.) (Agent: If yes, when and what did the pharmacy advise?)  This is the patient's preferred pharmacy:  [phone] [place] - [address] [place] [address] Phone: [phone] Fax: [phone]  Is this the correct pharmacy for this prescription? Yes If no, delete pharmacy and type the correct one.   Has the prescription been filled recently? Yes  Is the patient out of the medication? No  Has the patient been seen for an appointment in the last year OR does the patient have an upcoming appointment? No  Can we respond through MyChart? No  Agent: Please be advised that Rx refills may take up to 3 business days. We ask that you follow-up with your pharmacy.

## 2024-02-23 NOTE — Telephone Encounter (Signed)
 Spoke with pt relaying Kate's message. Pt states he was able to pick up rx yesterday but expresses his thanks for the call.

## 2024-03-09 ENCOUNTER — Ambulatory Visit: Payer: Self-pay | Admitting: *Deleted

## 2024-03-09 NOTE — Telephone Encounter (Signed)
 Noted

## 2024-03-09 NOTE — Telephone Encounter (Signed)
 FYI Only or Action Required?: FYI only for provider: no open appointment- UC advised .  Patient was last seen in primary care on 05/17/2023 by Gretta Comer POUR, NP.  Called Nurse Triage reporting Dysuria.  Symptoms began several days ago.  Interventions attempted: Rest, hydration, or home remedies.  Symptoms are: gradually worsening.  Triage Disposition: See Physician Within 24 Hours  Patient/caregiver understands and will follow disposition?: Yes  Copied from CRM #[id number]. Topic: Clinical - Red Word Triage >> Mar 09, 2024  1:18 PM Suzen RAMAN wrote: Red Word that prompted transfer to Nurse Triage: painful urination and difficulty urinating Reason for Disposition  All other males with painful urination  Answer Assessment - Initial Assessment Questions 1. SEVERITY: How bad is the pain?  (e.g., Scale 1-10; mild, moderate, or severe)     Pain at end of urinary stream 2. FREQUENCY: How many times have you had painful urination today?      Today- patient has increased fluids- burning slightly better 3. PATTERN: Is pain present every time you urinate or just sometimes?      yes 4. ONSET: When did the painful urination start?      2 days 5. FEVER: Do you have a fever? If Yes, ask: What is your temperature, how was it measured, and when did it start?     no 6. PAST UTI: Have you had a urine infection before? If Yes, ask: When was the last time? and What happened that time?      no 7. CAUSE: What do you think is causing the painful urination?      Kidney stone 8. OTHER SYMPTOMS: Do you have any other symptoms? (e.g., flank pain, penis discharge, scrotal pain, blood in urine)     Interrupted stream  Protocols used: Urination Pain - Male-A-AH

## 2024-03-21 ENCOUNTER — Ambulatory Visit: Payer: Self-pay | Admitting: Primary Care

## 2024-03-21 ENCOUNTER — Encounter: Payer: Self-pay | Admitting: Primary Care

## 2024-03-21 ENCOUNTER — Ambulatory Visit: Admitting: Primary Care

## 2024-03-21 VITALS — BP 134/84 | HR 81 | Temp 98.1°F | Ht 65.0 in | Wt 162.4 lb

## 2024-03-21 DIAGNOSIS — R350 Frequency of micturition: Secondary | ICD-10-CM

## 2024-03-21 DIAGNOSIS — R3129 Other microscopic hematuria: Secondary | ICD-10-CM | POA: Diagnosis not present

## 2024-03-21 LAB — POC URINALSYSI DIPSTICK (AUTOMATED)
Bilirubin, UA: NEGATIVE
Glucose, UA: NEGATIVE
Ketones, UA: NEGATIVE
Leukocytes, UA: NEGATIVE
Nitrite, UA: NEGATIVE
Protein, UA: NEGATIVE
Spec Grav, UA: 1.025
Urobilinogen, UA: 0.2 U/dL
pH, UA: 6

## 2024-03-21 LAB — URINALYSIS, MICROSCOPIC ONLY: RBC / HPF: NONE SEEN

## 2024-03-21 NOTE — Patient Instructions (Signed)
 Will be in touch once you receive your urine test results.  Complete the antibiotic as prescribed.  It was a pleasure to see you today!

## 2024-03-21 NOTE — Assessment & Plan Note (Signed)
 UA today with 1+ blood, otherwise negative for acute infection  Urine microscopic ordered and pending Await results

## 2024-03-21 NOTE — Assessment & Plan Note (Addendum)
 Resolved.   UA today with 1+ blood, otherwise negative. Urine microscopic pending.  Will defer urine culture as his symptoms have resolved and last culture was negative.   Discussed to complete the entire course of Bactrim  DS tablets as prescribed.

## 2024-03-21 NOTE — Progress Notes (Signed)
 "  Subjective:    Patient ID: Tony Hernandez, male    DOB: 1955-12-23, 68 y.o.   MRN: [id number]  Tony Hernandez is a very pleasant 68 y.o. male with a history of hypothyroidism, right colectomy, prostate cancer with history of radical prostatectomy, CVA, prediabetes, hyperlipidemia who presents today for urgent care follow-up.  He presented to urgent care in [place] on 07/10/2023 for increased urinary frequency in the evening with hesitancy and postvoid urination and dysuria.  Urinalysis that day revealed trace blood, otherwise negative for acute infection.  He was treated for presumed infection with Bactrim  DS tablets twice daily x 7 days.  Urine collection was completed for which was negative.  Today his symptoms have completely resolved. He is urinating well. He has yet to complete his course of Bactrim  DS tablets as he has been taking 1 tablet every few days. He denies gross hematuria, dysuria, increased urinary frequency. He's feeling well.    Review of Systems  Gastrointestinal:  Negative for abdominal pain and nausea.  Genitourinary:  Negative for dysuria, frequency, hematuria and urgency.         Past Medical History:  Diagnosis Date   Abdominal pain 06/19/2021   Acute diarrhea 08/27/2022   Arthritis    knees   Generalized abdominal pain 06/19/2021   GERD (gastroesophageal reflux disease)    Microscopic hematuria 09/03/2021   Thyroid  disease    Urinary frequency 02/10/2023   Wears dentures     Social History   Socioeconomic History   Marital status: Married    Spouse name: Not on file   Number of children: Not on file   Years of education: Not on file   Highest education level: Not on file  Occupational History   Not on file  Tobacco Use   Smoking status: Former    Current packs/day: 0.00    Types: Cigarettes    Start date: 06/22/1991    Quit date: 06/21/2001    Years since quitting: 22.7   Smokeless tobacco: Never  Vaping Use   Vaping status: Never Used   Substance and Sexual Activity   Alcohol use: No    Alcohol/week: 0.0 standard drinks of alcohol    Comment: quit 18 years ago   Drug use: Not Currently    Types: Cocaine, Marijuana    Comment: quit 18 years ago    Sexual activity: Yes    Birth control/protection: None  Other Topics Concern   Not on file  Social History Narrative   Single.   2 children.   Works as a naval architect.   Enjoys spending time with family.   Social Drivers of Health   Tobacco Use: Medium Risk (03/21/2024)   Patient History    Smoking Tobacco Use: Former    Smokeless Tobacco Use: Never    Passive Exposure: Not on file  Financial Resource Strain: Not on file  Food Insecurity: No Food Insecurity (03/13/2022)   Hunger Vital Sign    Worried About Running Out of Food in the Last Year: Never true    Ran Out of Food in the Last Year: Never true  Transportation Needs: No Transportation Needs (03/13/2022)   PRAPARE - Administrator, Civil Service (Medical): No    Lack of Transportation (Non-Medical): No  Physical Activity: Not on file  Stress: Not on file  Social Connections: Not on file  Intimate Partner Violence: Not At Risk (03/13/2022)   Humiliation, Afraid, Rape, and Kick questionnaire  Fear of Current or Ex-Partner: No    Emotionally Abused: No    Physically Abused: No    Sexually Abused: No  Depression (PHQ2-9): Low Risk (03/21/2024)   Depression (PHQ2-9)    PHQ-2 Score: 1  Alcohol Screen: Not on file  Housing: Low Risk (03/13/2022)   Housing    Last Housing Risk Score: 0  Utilities: Not At Risk (03/13/2022)   [facility]HC Utilities    Threatened with loss of utilities: No  Health Literacy: Not on file    Past Surgical History:  Procedure Laterality Date   CHOLECYSTECTOMY  2018   COLONOSCOPY WITH PROPOFOL  N/A 05/19/2019   Procedure: COLONOSCOPY WITH PROPOFOL ;  Surgeon: Therisa Bi, MD;  Location: [facility];  Service: Endoscopy;  Laterality: N/A;  Priority 4   POLYPECTOMY   05/19/2019   Procedure: POLYPECTOMY;  Surgeon: Therisa Bi, MD;  Location: [facility];  Service: Endoscopy;;   ROBOT ASSISTED LAPAROSCOPIC RADICAL PROSTATECTOMY Bilateral 08/17/2019   Procedure: XI ROBOTIC ASSISTED LAPAROSCOPIC PROSTATECTOMY, BILATERAL LYMPH NODE DISSECTION;  Surgeon: Penne Knee, MD;  Location: [facility];  Service: Urology;  Laterality: Bilateral;    Family History  Problem Relation Age of Onset   Lung cancer Mother 7   Diabetes Paternal Grandmother    Colon cancer Sister 67    Allergies[1]  Medications Ordered Prior to Encounter[2]  BP 134/84   Pulse 81   Temp 98.1 F (36.7 C) (Oral)   Ht 5' 5 (1.651 m)   Wt 162 lb 6 oz (73.7 kg)   SpO2 98%   BMI 27.02 kg/m  Objective:   Physical Exam Constitutional:      General: He is not in acute distress. Cardiovascular:     Rate and Rhythm: Normal rate.  Pulmonary:     Effort: Pulmonary effort is normal.  Skin:    General: Skin is warm and dry.     Physical Exam        Assessment & Plan:  Microscopic hematuria Assessment & Plan: UA today with 1+ blood, otherwise negative for acute infection  Urine microscopic ordered and pending Await results  Orders: -     POCT Urinalysis Dipstick (Automated) -     Urine Microscopic  Urinary frequency Assessment & Plan: Resolved.   UA today with 1+ blood, otherwise negative. Urine microscopic pending.  Will defer urine culture as his symptoms have resolved and last culture was negative.   Discussed to complete the entire course of Bactrim  DS tablets as prescribed.  Orders: -     POCT Urinalysis Dipstick (Automated)    Assessment and Plan Assessment & Plan         Comer MARLA Gaskins, NP       [1] No Known Allergies [2]  Current Outpatient Medications on File Prior to Visit  Medication Sig Dispense Refill   levothyroxine  (SYNTHROID ) 150 MCG tablet Take 1 tablet by mouth every morning on an empty stomach with water  only 6  days weekly.  No food or other medications for 30 minutes. 72 tablet 2   sulfamethoxazole -trimethoprim  (BACTRIM  DS) 800-160 MG tablet SMARTSIG:1 Tablet(s) By Mouth Morning-Evening     atorvastatin  (LIPITOR) 20 MG tablet Take 1 tablet (20 mg total) by mouth daily. for cholesterol. (Patient not taking: Reported on 03/21/2024) 90 tablet 3   No current facility-administered medications on file prior to visit.   "

## 2024-05-17 ENCOUNTER — Encounter: Admitting: Primary Care
